# Patient Record
Sex: Male | Born: 1965 | Race: White | Hispanic: No | Marital: Married | Smoking: Never smoker
Health system: Southern US, Community
[De-identification: ages and names within clinical notes are randomized; demographics above are authoritative.]

## PROBLEM LIST (undated history)

## (undated) DIAGNOSIS — I10 Essential (primary) hypertension: Secondary | ICD-10-CM

## (undated) DIAGNOSIS — E039 Hypothyroidism, unspecified: Secondary | ICD-10-CM

## (undated) DIAGNOSIS — I2510 Atherosclerotic heart disease of native coronary artery without angina pectoris: Secondary | ICD-10-CM

## (undated) DIAGNOSIS — E785 Hyperlipidemia, unspecified: Secondary | ICD-10-CM

## (undated) HISTORY — PX: CORONARY ANGIOPLASTY: SHX604

## (undated) HISTORY — PX: HERNIA REPAIR: SHX51

## (undated) HISTORY — PX: NASAL SINUS SURGERY: SHX719

## (undated) HISTORY — PX: CHOLECYSTECTOMY: SHX55

## (undated) HISTORY — PX: CARDIAC SURGERY: SHX584

## (undated) HISTORY — PX: PILONIDAL CYST DRAINAGE: SHX743

## (undated) HISTORY — PX: TONSILLECTOMY: SUR1361

---

## 2004-10-08 DIAGNOSIS — I2510 Atherosclerotic heart disease of native coronary artery without angina pectoris: Secondary | ICD-10-CM

## 2004-10-08 HISTORY — DX: Atherosclerotic heart disease of native coronary artery without angina pectoris: I25.10

## 2014-11-05 DIAGNOSIS — E7800 Pure hypercholesterolemia, unspecified: Secondary | ICD-10-CM | POA: Insufficient documentation

## 2014-11-05 DIAGNOSIS — E039 Hypothyroidism, unspecified: Secondary | ICD-10-CM | POA: Insufficient documentation

## 2014-11-05 DIAGNOSIS — I10 Essential (primary) hypertension: Secondary | ICD-10-CM | POA: Insufficient documentation

## 2014-11-05 DIAGNOSIS — I2510 Atherosclerotic heart disease of native coronary artery without angina pectoris: Secondary | ICD-10-CM | POA: Insufficient documentation

## 2015-07-28 ENCOUNTER — Encounter: Payer: Self-pay | Admitting: *Deleted

## 2015-07-29 ENCOUNTER — Ambulatory Visit
Admission: RE | Admit: 2015-07-29 | Discharge: 2015-07-29 | Disposition: A | Discharge status: Home / Self Care | Payer: BLUE CROSS/BLUE SHIELD | Source: Ambulatory Visit | Attending: Gastroenterology | Admitting: Gastroenterology

## 2015-07-29 ENCOUNTER — Encounter: Payer: Self-pay | Admitting: *Deleted

## 2015-07-29 ENCOUNTER — Ambulatory Visit: Payer: BLUE CROSS/BLUE SHIELD | Admitting: Certified Registered Nurse Anesthetist

## 2015-07-29 ENCOUNTER — Encounter
Admission: RE | Disposition: A | Discharge status: Home / Self Care | Payer: Self-pay | Source: Ambulatory Visit | Attending: Gastroenterology

## 2015-07-29 DIAGNOSIS — I10 Essential (primary) hypertension: Secondary | ICD-10-CM | POA: Insufficient documentation

## 2015-07-29 DIAGNOSIS — Z7982 Long term (current) use of aspirin: Secondary | ICD-10-CM | POA: Diagnosis not present

## 2015-07-29 DIAGNOSIS — K644 Residual hemorrhoidal skin tags: Secondary | ICD-10-CM | POA: Insufficient documentation

## 2015-07-29 DIAGNOSIS — K621 Rectal polyp: Secondary | ICD-10-CM | POA: Diagnosis not present

## 2015-07-29 DIAGNOSIS — E039 Hypothyroidism, unspecified: Secondary | ICD-10-CM | POA: Diagnosis not present

## 2015-07-29 DIAGNOSIS — Z79899 Other long term (current) drug therapy: Secondary | ICD-10-CM | POA: Diagnosis not present

## 2015-07-29 DIAGNOSIS — E785 Hyperlipidemia, unspecified: Secondary | ICD-10-CM | POA: Insufficient documentation

## 2015-07-29 DIAGNOSIS — I2510 Atherosclerotic heart disease of native coronary artery without angina pectoris: Secondary | ICD-10-CM | POA: Diagnosis not present

## 2015-07-29 DIAGNOSIS — Z1211 Encounter for screening for malignant neoplasm of colon: Secondary | ICD-10-CM | POA: Diagnosis present

## 2015-07-29 HISTORY — DX: Atherosclerotic heart disease of native coronary artery without angina pectoris: I25.10

## 2015-07-29 HISTORY — PX: COLONOSCOPY WITH PROPOFOL: SHX5780

## 2015-07-29 HISTORY — DX: Essential (primary) hypertension: I10

## 2015-07-29 HISTORY — DX: Hypothyroidism, unspecified: E03.9

## 2015-07-29 HISTORY — DX: Hyperlipidemia, unspecified: E78.5

## 2015-07-29 SURGERY — COLONOSCOPY WITH PROPOFOL
Anesthesia: General

## 2015-07-29 MED ORDER — SODIUM CHLORIDE 0.9 % IV SOLN
INTRAVENOUS | Status: DC
  Administered 2015-07-29: 1000 mL via INTRAVENOUS

## 2015-07-29 MED ORDER — PROPOFOL 10 MG/ML IV BOLUS
INTRAVENOUS | Status: DC | PRN
  Administered 2015-07-29: 40 mg via INTRAVENOUS
  Administered 2015-07-29: 30 mg via INTRAVENOUS
  Administered 2015-07-29 (×2): 10 mg via INTRAVENOUS

## 2015-07-29 MED ORDER — MIDAZOLAM HCL 2 MG/2ML IJ SOLN
INTRAMUSCULAR | Status: DC | PRN
  Administered 2015-07-29: 2 mg via INTRAVENOUS

## 2015-07-29 MED ORDER — LIDOCAINE HCL (CARDIAC) 20 MG/ML IV SOLN
INTRAVENOUS | Status: DC | PRN
  Administered 2015-07-29: 100 mg via INTRAVENOUS

## 2015-07-29 MED ORDER — PROPOFOL 500 MG/50ML IV EMUL
INTRAVENOUS | Status: DC | PRN
  Administered 2015-07-29: 175 ug/kg/min via INTRAVENOUS

## 2015-07-29 NOTE — H&P (Signed)
  Primary Care Physician:  Rozanna BoxBABAOFF, MARC E, MD  Pre-Procedure History & Physical: HPI:  Chase Jensen is a 49 y.o. male is here for an colonoscopy.   Past Medical History  Diagnosis Date  . Hypothyroidism   . Hypertension   . Coronary artery disease   . Hyperlipemia     Past Surgical History  Procedure Laterality Date  . Hernia repair    . Cholecystectomy    . Nasal sinus surgery    . Pilonidal cyst drainage    . Cardiac surgery    . Coronary angioplasty    . Tonsillectomy      Prior to Admission medications   Medication Sig Start Date End Date Taking? Authorizing Provider  ALOE VERA PO Take by mouth.   Yes Historical Provider, MD  Ascorbic Acid (VITAMIN C) 1000 MG tablet Take 1,000 mg by mouth daily.   Yes Historical Provider, MD  aspirin (ASPIRIN EC) 81 MG EC tablet Take 81 mg by mouth daily. Swallow whole.   Yes Historical Provider, MD  levothyroxine (SYNTHROID, LEVOTHROID) 50 MCG tablet Take 50 mcg by mouth daily before breakfast.   Yes Historical Provider, MD  lisinopril (PRINIVIL,ZESTRIL) 10 MG tablet Take 10 mg by mouth daily.   Yes Historical Provider, MD  rosuvastatin (CRESTOR) 40 MG tablet Take 40 mg by mouth daily.   Yes Historical Provider, MD  vitamin B-12 (CYANOCOBALAMIN) 100 MCG tablet Take 100 mcg by mouth daily.   Yes Historical Provider, MD    Allergies as of 07/01/2015  . (Not on File)    History reviewed. No pertinent family history.  Social History   Social History  . Marital Status: Married    Spouse Name: N/A  . Number of Children: N/A  . Years of Education: N/A   Occupational History  . Not on file.   Social History Main Topics  . Smoking status: Never Smoker   . Smokeless tobacco: Not on file  . Alcohol Use: Not on file  . Drug Use: Not on file  . Sexual Activity: Not on file   Other Topics Concern  . Not on file   Social History Narrative     Physical Exam: BP 123/73 mmHg  Pulse 52  Temp(Src) 98 F (36.7 C) (Tympanic)   Resp 20  Ht 6' (1.829 m)  Wt 85.73 kg (189 lb)  BMI 25.63 kg/m2  SpO2 100% General:   Alert,  pleasant and cooperative in NAD Head:  Normocephalic and atraumatic. Neck:  Supple; no masses or thyromegaly. Lungs:  Clear throughout to auscultation.    Heart:  Regular rate and rhythm. Abdomen:  Soft, nontender and nondistended. Normal bowel sounds, without guarding, and without rebound.   Neurologic:  Alert and  oriented x4;  grossly normal neurologically.  Impression/Plan: Chase Jensen is here for an colonoscopy to be performed for screening  Risks, benefits, limitations, and alternatives regarding  colonoscopy have been reviewed with the patient.  Questions have been answered.  All parties agreeable.   Elnita MaxwellEIN, MATTHEW GORDON, MD  07/29/2015, 10:32 AM

## 2015-07-29 NOTE — Op Note (Signed)
[facility] Gastroenterology Patient Name: Chase Jensen Procedure Date: 07/29/2015 10:41 AM MRN: [id number] Account #: [id number] Date of Birth: [birth year] Admit Type: Outpatient Age: 49 Room: [facility] ENDO ROOM 3 Gender: Male Note Status: Finalized Procedure:         Colonoscopy Indications:       Screening for colorectal malignant neoplasm, Last                     colonoscopy 10 years ago Patient Profile:   This is a 49 year old male. Providers:         Rhona Raider. Shelle Iron, MD Referring MD:      Hassell Halim (Referring MD) Medicines:         Propofol per Anesthesia Complications:     No immediate complications. Procedure:         Pre-Anesthesia Assessment:                    - Prior to the procedure, a History and Physical was                     performed, and patient medications, allergies and                     sensitivities were reviewed. The patient's tolerance of                     previous anesthesia was reviewed.                    After obtaining informed consent, the colonoscope was                     passed under direct vision. Throughout the procedure, the                     patient's blood pressure, pulse, and oxygen saturations                     were monitored continuously. The Olympus CF-Q160AL                     colonoscope (S#. [id number]) was introduced through the anus                     and advanced to the the cecum, identified by appendiceal                     orifice and ileocecal valve. The colonoscopy was performed                     without difficulty. The patient tolerated the procedure                     well. The quality of the bowel preparation was good. Findings:      The perianal exam findings include non-thrombosed external hemorrhoids.      A 4 mm polyp was found in the rectum. The polyp was sessile. The polyp       was removed with a cold snare. Resection and retrieval were complete.      The exam was otherwise without  abnormality on direct and retroflexion       views. Impression:        - Non-thrombosed external hemorrhoids found on perianal  exam.                    - One 4 mm polyp in the rectum. Resected and retrieved.                    - The examination was otherwise normal on direct and                     retroflexion views. Recommendation:    - Observe patient in GI recovery unit.                    - High fiber diet.                    - Continue present medications.                    - Await pathology results.                    - Repeat colonoscopy for surveillance based on pathology                     results.                    - Return to referring physician.                    - The findings and recommendations were discussed with the                     patient.                    - The findings and recommendations were discussed with the                     patient's family. Procedure Code(s): --- Professional ---                    [phone], Colonoscopy, flexible; with removal of tumor(s),                     polyp(s), or other lesion(s) by snare technique CPT copyright 2014 American Medical Association. All rights reserved. The codes documented in this report are preliminary and upon coder review may  be revised to meet current compliance requirements. Kathalene FramesMatthew G Rein, MD 07/29/2015 11:15:04 AM This report has been signed electronically. Number of Addenda: 0 Note Initiated On: 07/29/2015 10:41 AM Scope Withdrawal Time: 0 hours 14 minutes 15 seconds  Total Procedure Duration: 0 hours 21 minutes 33 seconds       [facility]

## 2015-07-29 NOTE — Transfer of Care (Signed)
Immediate Anesthesia Transfer of Care Note  Patient: Chase Jensen  Procedure(s) Performed: Procedure(s): COLONOSCOPY WITH PROPOFOL (N/A)  Patient Location: PACU  Anesthesia Type:General  Level of Consciousness: sedated  Airway & Oxygen Therapy: Patient Spontanous Breathing and Patient connected to nasal cannula oxygen  Post-op Assessment: Report given to RN and Post -op Vital signs reviewed and stable  Post vital signs: Reviewed and stable  Last Vitals:  Filed Vitals:   07/29/15 1112  BP: 95/61  Pulse:   Temp: 35.9 C  Resp: 16    Complications: No apparent anesthesia complications

## 2015-07-29 NOTE — Anesthesia Preprocedure Evaluation (Signed)
Anesthesia Evaluation  Patient identified by MRN, date of birth, ID band Patient awake    Reviewed: Allergy & Precautions, H&P , NPO status , Patient's Chart, lab work & pertinent test results, reviewed documented beta blocker date and time   History of Anesthesia Complications Negative for: history of anesthetic complications  Airway Mallampati: I  TM Distance: >3 FB Neck ROM: full    Dental no notable dental hx. (+) Caps   Pulmonary neg pulmonary ROS,    Pulmonary exam normal breath sounds clear to auscultation       Cardiovascular Exercise Tolerance: Good hypertension, On Medications (-) angina+ CAD  (-) Past MI, (-) Cardiac Stents and (-) CABG Normal cardiovascular exam(-) dysrhythmias (-) Valvular Problems/Murmurs Rhythm:regular Rate:Normal     Neuro/Psych negative neurological ROS  negative psych ROS   GI/Hepatic negative GI ROS, Neg liver ROS,   Endo/Other  neg diabetesHypothyroidism   Renal/GU negative Renal ROS  negative genitourinary   Musculoskeletal   Abdominal   Peds  Hematology negative hematology ROS (+)   Anesthesia Other Findings Past Medical History:   Hypothyroidism                                               Hypertension                                                 Coronary artery disease                                      Hyperlipemia                                                 Reproductive/Obstetrics negative OB ROS                             Anesthesia Physical Anesthesia Plan  ASA: II  Anesthesia Plan: General   Post-op Pain Management:    Induction:   Airway Management Planned:   Additional Equipment:   Intra-op Plan:   Post-operative Plan:   Informed Consent: I have reviewed the patients History and Physical, chart, labs and discussed the procedure including the risks, benefits and alternatives for the proposed anesthesia with the  patient or authorized representative who has indicated his/her understanding and acceptance.   Dental Advisory Given  Plan Discussed with: Anesthesiologist, CRNA and Surgeon  Anesthesia Plan Comments:         Anesthesia Quick Evaluation

## 2015-07-29 NOTE — Anesthesia Procedure Notes (Signed)
Performed by: Jaques Mineer Pre-anesthesia Checklist: Patient identified, Emergency Drugs available, Suction available and Patient being monitored Patient Re-evaluated:Patient Re-evaluated prior to inductionOxygen Delivery Method: Nasal cannula     

## 2015-08-01 LAB — SURGICAL PATHOLOGY

## 2015-08-01 NOTE — Anesthesia Postprocedure Evaluation (Signed)
  Anesthesia Post-op Note  Patient: Chase Jensen  Procedure(s) Performed: Procedure(s): COLONOSCOPY WITH PROPOFOL (N/A)  Anesthesia type:General  Patient location: PACU  Post pain: Pain level controlled  Post assessment: Post-op Vital signs reviewed, Patient's Cardiovascular Status Stable, Respiratory Function Stable, Patent Airway and No signs of Nausea or vomiting  Post vital signs: Reviewed and stable  Last Vitals:  Filed Vitals:   07/29/15 1140  BP: 119/77  Pulse: 54  Temp:   Resp: 14    Level of consciousness: awake, alert  and patient cooperative  Complications: No apparent anesthesia complications

## 2015-08-04 ENCOUNTER — Encounter: Payer: Self-pay | Admitting: Gastroenterology

## 2016-07-07 NOTE — Progress Notes (Signed)
Chase Jensen [address] [place], [place] [address] Phone: [phone] Subjective:    I'm seeing this patient by the request  of:  Baboff MD.  Carren Rang  CC: Bilateral hamstring pain.  Chase Jensen  Chase Jensen is a 50 y.o. male coming in with complaint of hamstring pain. Patient hasn't been running. Has been having this pain now for several months. Patient states that this left greater than right but both seemed to be hurting him. Patient was seen by primary care provider and was given stretches and declined formal physical therapy patient states that the pain in extension with gluteal region down to the right knee. Patient has tried over-the-counter anti-inflammatories that does help but does not ever completely resolved the pain. Only does aerobic exercises with no weight lifting. Patient rates the severity of pain a 6 out of 10. States that the pain seems to be worse when he goes from sitting to standing position. States that he feels some tightness most immediately when he tries to do any running as well.     Past Medical History:  Diagnosis Date  . Coronary artery disease   . Hyperlipemia   . Hypertension   . Hypothyroidism    Past Surgical History:  Procedure Laterality Date  . CARDIAC SURGERY    . CHOLECYSTECTOMY    . COLONOSCOPY WITH PROPOFOL N/A 07/29/2015   Procedure: COLONOSCOPY WITH PROPOFOL;  Surgeon: Elnita Maxwell, MD;  Location[facility] ENDOSCOPY;  Service: Endoscopy;  Laterality: N/A;  . CORONARY ANGIOPLASTY    . HERNIA REPAIR    . NASAL SINUS SURGERY    . PILONIDAL CYST DRAINAGE    . TONSILLECTOMY     Social History   Social History  . Marital status: Married    Spouse name: N/A  . Number of children: N/A  . Years of education: N/A   Social History Main Topics  . Smoking status: Never Smoker  . Smokeless tobacco: Not on file  . Alcohol use Not on file  . Drug use: Unknown  . Sexual activity: Not on file    Other Topics Concern  . Not on file   Social History Narrative  . No narrative on file   Allergies  Allergen Reactions  . Penicillins Rash  . Propoxyphene Rash    Darvocet N Acetaminophen   No family history on file.  Past medical history, social, surgical and family history all reviewed in electronic medical record.  No pertanent information unless stated regarding to the chief complaint.   Review of Systems: No headache, visual changes, nausea, vomiting, diarrhea, constipation, dizziness, abdominal pain, skin rash, fevers, chills, night sweats, weight loss, swollen lymph nodes, body aches, joint swelling, muscle aches, chest pain, shortness of breath, mood changes.   Objective  Blood pressure 122/76, pulse 62, weight 212 lb (96.2 kg).  General: No apparent distress alert and oriented x3 mood and affect normal, dressed appropriately.  HEENT: Pupils equal, extraocular movements intact  Respiratory: Patient's speak in full sentences and does not appear short of breath  Cardiovascular: No lower extremity edema, non tender, no erythema  Skin: Warm dry intact with no signs of infection or rash on extremities or on axial skeleton.  Abdomen: Soft nontender  Neuro: Cranial nerves II through XII are intact, neurovascularly intact in all extremities with 2+ DTRs and 2+ pulses.  Lymph: No lymphadenopathy of posterior or anterior cervical chain or axillae bilaterally.  Gait normal with good balance and coordination.  MSK:  Non tender with full range of motion and good stability and symmetric strength and tone of shoulders, elbows, wrist, hip, knee and ankles bilaterally.  Back Exam:  Inspection: Unremarkable  Motion: Flexion 45 deg, Extension 25 deg, Side Bending to 35 deg bilaterally,  Rotation to 35 deg bilaterally  SLR laying: Negative  XSLR laying: Negative  Palpable tenderness: Tenderness more over the piriformis muscle. FABER: Positive right side. Sensory change: Gross sensation  intact to all lumbar and sacral dermatomes.  Reflexes: 2+ at both patellar tendons, 2+ at achilles tendons, Babinski's downgoing.  Strength at foot  Plantar-flexion: 5/5 Dorsi-flexion: 5/5 Eversion: 5/5 Inversion: 5/5  Leg strength  Quad: 5/5 Hamstring: 5/5 Hip flexor: 5/5 Hip abductors: 4/5 but symmetric Gait unremarkable.  Procedure note 97110; 15 minutes spent for Therapeutic exercises as stated in above notes.  This included exercises focusing on stretching, strengthening, with significant focus on eccentric aspects. Piriformis Syndrome  Using an anatomical model, reviewed with the patient the structures involved and how they related to diagnosis. The patient indicated understanding.   The patient was given a handout from Dr. Ailene Ardsouzier's book "The Sports Jensen Patient Advisor" describing the anatomy and rehabilitation of the following condition: Piriformis Syndrome  Also given a handout with more extensive Piriformis stretching, hip flexor and abductor strengthening, ham stretching  Rec deep massage, explained self-massage with ball    Proper technique shown and discussed handout in great detail with ATC.  All questions were discussed and answered.     Impression and Recommendations:     This case required medical decision making of moderate complexity.      Note: This dictation was prepared with Dragon dictation along with smaller phrase technology. Any transcriptional errors that result from this process are unintentional.

## 2016-07-09 ENCOUNTER — Ambulatory Visit (INDEPENDENT_AMBULATORY_CARE_PROVIDER_SITE_OTHER): Payer: BLUE CROSS/BLUE SHIELD | Admitting: Family Medicine

## 2016-07-09 ENCOUNTER — Encounter: Payer: Self-pay | Admitting: Family Medicine

## 2016-07-09 DIAGNOSIS — G5701 Lesion of sciatic nerve, right lower limb: Secondary | ICD-10-CM | POA: Insufficient documentation

## 2016-07-09 MED ORDER — VITAMIN D (ERGOCALCIFEROL) 1.25 MG (50000 UNIT) PO CAPS: 50000.0000 [IU] | ORAL_CAPSULE | ORAL | 0 refills | Status: DC

## 2016-07-09 NOTE — Patient Instructions (Signed)
Good to see you  Ice 20 minutes 2 times daily. Usually after activity and before bed. Exercises 3 times a week.  Duexis 3 times a day for 3 days.  Once weekly vitamin D for the next 12 weeks. This will help with muscle strength and endurance I can tell not running is causing some anxiety.  I would prefer biking but if you need to run limit to 3 times weekly at most.  Start a walk-run progression  30 mins: - Run 2 mins, then walk 1 min. -Then run 3 mins, and walk 1 min. -Then run 4 mins, and walk 1 min. -Then run 5 mins, and walk 1 min. -Slowly build up weekly to running 30 mins nonstop.  If painful at any of the steps, back up one step.  Thigh compression sleeve can help with activity as well.  See me again in 3-4 weeks.

## 2016-07-09 NOTE — Assessment & Plan Note (Addendum)
Piriformis Syndrome  Using an anatomical model, reviewed with the patient the structures involved and how they related to diagnosis. The patient indicated understanding.   The patient was given a handout from Dr. Ailene Ardsouzier's book "The Sports Medicine Patient Advisor" describing the anatomy and rehabilitation of the following condition: Piriformis Syndrome  Also given a handout with more extensive Piriformis stretching, hip flexor and abductor strengthening, ham stretching  Rec deep massage, explained self-massage with ball Discussed also once weekly vitamin D can help with muscle strength and endurance. We discussed core strengthening exercises. Work with Event organiserathletic trainer. Patient will come back in 4-6 weeks for further evaluation and treatment.

## 2016-07-10 ENCOUNTER — Encounter: Payer: Self-pay | Admitting: Family Medicine

## 2016-07-12 ENCOUNTER — Encounter: Payer: Self-pay | Admitting: Family Medicine

## 2016-07-12 MED ORDER — VITAMIN D (ERGOCALCIFEROL) 1.25 MG (50000 UNIT) PO CAPS: 50000.0000 [IU] | ORAL_CAPSULE | ORAL | 0 refills | Status: DC

## 2016-07-29 NOTE — Progress Notes (Signed)
[name] [facility] [address] [place], [place] [address] Phone: [phone] Subjective:      CC: Bilateral hamstring pain Follow-up  GNF:[id number]:Subjective  Chase Jensen is a [age] y.o. male coming in with complaint of hamstring pain. Patient was found to have more of a piriformis syndrome on the right side. Given once weekly vitamin D, home exercises, icing protocol. Patient states She is a proximally 70% better. States that he is not having as much pain. Still some radiation going down the leg. Seems to stop at the knee. Has not been running. Unable to bike secondary to hip pain.  New problem. Patient is also having left elbow pain. Worse with repetitive activity. Describes dull, throbbing aching sensation. Has woken him up at night. Denies any significant weakness but states it does not feel normal. Patient has to change certain daily activities secondary to the discomfort.    Past Medical History:  Diagnosis Date  . Coronary artery disease   . Hyperlipemia   . Hypertension   . Hypothyroidism    Past Surgical History:  Procedure Laterality Date  . CARDIAC SURGERY    . CHOLECYSTECTOMY    . COLONOSCOPY WITH PROPOFOL N/A 07/29/2015   Procedure: COLONOSCOPY WITH PROPOFOL;  Surgeon: Elnita MaxwellMatthew Gordon Rein, MD;  [facility];  Service: Endoscopy;  Laterality: N/A;  . CORONARY ANGIOPLASTY    . HERNIA REPAIR    . NASAL SINUS SURGERY    . PILONIDAL CYST DRAINAGE    . TONSILLECTOMY     Social History   Social History  . Marital status: Married    Spouse name: N/A  . Number of children: N/A  . Years of education: N/A   Social History Main Topics  . Smoking status: Never Smoker  . Smokeless tobacco: Not on file  . Alcohol use Not on file  . Drug use: Unknown  . Sexual activity: Not on file   Other Topics Concern  . Not on file   Social History Narrative  . No narrative on file   Allergies  Allergen Reactions  . Penicillins Rash  .  Propoxyphene Rash    Darvocet N Acetaminophen   No family history on file.  Past medical history, social, surgical and family history all reviewed in electronic medical record.  No pertanent information unless stated regarding to the chief complaint.   Review of Systems: No headache, visual changes, nausea, vomiting, diarrhea, constipation, dizziness, abdominal pain, skin rash, fevers, chills, night sweats, weight loss, swollen lymph nodes, body aches, joint swelling, muscle aches, chest pain, shortness of breath, mood changes.   Objective  There were no vitals taken for this visit.  General: No apparent distress alert and oriented x3 mood and affect normal, dressed appropriately.  HEENT: Pupils equal, extraocular movements intact  Respiratory: Patient's speak in full sentences and does not appear short of breath  Cardiovascular: No lower extremity edema, non tender, no erythema  Skin: Warm dry intact with no signs of infection or rash on extremities or on axial skeleton.  Abdomen: Soft nontender  Neuro: Cranial nerves II through XII are intact, neurovascularly intact in all extremities with 2+ DTRs and 2+ pulses.  Lymph: No lymphadenopathy of posterior or anterior cervical chain or axillae bilaterally.  Gait normal with good balance and coordination.  MSK:  Non tender with full range of motion and good stability and symmetric strength and tone of shoulders,  wrist, hip, knee and ankles bilaterally.  Back Exam:  Inspection:  Unremarkable  Motion: Flexion 45 deg, Extension 25 deg, Side Bending to 35 deg bilaterally,  Rotation to 35 deg bilaterally  SLR laying: Negative  XSLR laying: Negative  Palpable tenderness: Tenderness more over the piriformis muscle. Mild in paraspnal musculature.  FABER: Positive right side still  Sensory change: Gross sensation intact to all lumbar and sacral dermatomes.  Reflexes: 2+ at both patellar tendons, 2+ at achilles tendons, Babinski's downgoing.    Strength at foot  Plantar-flexion: 5/5 Dorsi-flexion: 5/5 Eversion: 5/5 Inversion: 5/5  Leg strength  Quad: 5/5 Hamstring: 5/5 Hip flexor: 5/5 Hip abductors: 4+/5 but symmetric Gait unremarkable.  Elbow: Left Unremarkable to inspection. Range of motion full pronation, supination, flexion, extension. Strength is full to all of the above directions Stable to varus, valgus stress. Negative moving valgus stress test. Tender to palpation lateral epicondylar region.  Ulnar nerve does not sublux. Negative cubital tunnel Tinel's. Contralateral elbow unremarkable  Osteopathic findings.  C2 flexed rotated and side bent right C4 flexed rotated and side bent left T3 extended rotated and side bent right with inhaled third rib T8 extended rotated and side bent left.  L2 flexed rotated and side bent right Sacrum left on left   Impression and Recommendations:     This case required medical decision making of moderate complexity.      Note: This dictation was prepared with Dragon dictation along with smaller phrase technology. Any transcriptional errors that result from this process are unintentional.

## 2016-07-30 ENCOUNTER — Ambulatory Visit (INDEPENDENT_AMBULATORY_CARE_PROVIDER_SITE_OTHER): Payer: BLUE CROSS/BLUE SHIELD | Admitting: Family Medicine

## 2016-07-30 ENCOUNTER — Encounter: Payer: Self-pay | Admitting: Family Medicine

## 2016-07-30 DIAGNOSIS — M7712 Lateral epicondylitis, left elbow: Secondary | ICD-10-CM | POA: Diagnosis not present

## 2016-07-30 DIAGNOSIS — M999 Biomechanical lesion, unspecified: Secondary | ICD-10-CM | POA: Insufficient documentation

## 2016-07-30 DIAGNOSIS — G5701 Lesion of sciatic nerve, right lower limb: Secondary | ICD-10-CM | POA: Diagnosis not present

## 2016-07-30 MED ORDER — GABAPENTIN 100 MG PO CAPS: 200.0000 mg | ORAL_CAPSULE | Freq: Every day | ORAL | 3 refills | Status: DC

## 2016-07-30 MED ORDER — DICLOFENAC SODIUM 2 % TD SOLN: 2.0000 "application " | Freq: Two times a day (BID) | TRANSDERMAL | 3 refills | Status: DC

## 2016-07-30 NOTE — Assessment & Plan Note (Signed)
Decision today to treat with OMT was based on Physical Exam  After verbal consent patient was treated with HVLA, ME, FPR techniques in cervical, thoracic, lumbar and sacral areas  Patient tolerated the procedure well with improvement in symptoms  Patient given exercises, stretches and lifestyle modifications  See medications in patient instructions if given  Patient will follow up in 3-6 weeks 

## 2016-07-30 NOTE — Patient Instructions (Signed)
You are making progress We tried some manipulation today and I think you will do great  Keep trucking along Gabapentin 200mg  at night For the arm pick up things underhand or thumbs up Avoid excessive wrist extension  pennsaid pinkie amount topically 2 times daily as needed.  See me again in 3-4 weeks.

## 2016-07-30 NOTE — Assessment & Plan Note (Signed)
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  Pt. given a formal rehab program. Series of concentric and eccentric exercises should be done starting with no weight, work up to 1 lb, hammer, etc.  Use counterforce strap if working or using hands.  Formal PT would be beneficial. Emphasized stretching an cross-friction massage Emphasized proper palms up lifting biomechanics to unload ECRB  

## 2016-07-30 NOTE — Assessment & Plan Note (Signed)
Patient is making some improvement. Attempted osteopathic manipulation. Tolerated the procedure well. We discussed icing regimen and core strengthening. Patient will continue with conservative therapy. Started on gabapentin for nighttime pain and radiation. Follow-up again

## 2016-08-20 ENCOUNTER — Ambulatory Visit (INDEPENDENT_AMBULATORY_CARE_PROVIDER_SITE_OTHER): Payer: BLUE CROSS/BLUE SHIELD | Admitting: Family Medicine

## 2016-08-20 ENCOUNTER — Encounter: Payer: Self-pay | Admitting: Family Medicine

## 2016-08-20 VITALS — BP 130/80 | HR 65 | Ht 72.0 in | Wt 215.0 lb

## 2016-08-20 DIAGNOSIS — G5701 Lesion of sciatic nerve, right lower limb: Secondary | ICD-10-CM

## 2016-08-20 DIAGNOSIS — M999 Biomechanical lesion, unspecified: Secondary | ICD-10-CM | POA: Diagnosis not present

## 2016-08-20 DIAGNOSIS — M7712 Lateral epicondylitis, left elbow: Secondary | ICD-10-CM

## 2016-08-20 NOTE — Progress Notes (Signed)
Chase Jensen D.O. [facility] [address] [place], [place] [address] Phone: [phone] Subjective:      CC: Bilateral hamstring pain Follow-up  OZH:[id number]:Subjective  Chase Jensen is a 50 y.o. male coming in with complaint of hamstring pain. Patient was found to have more of a piriformis syndrome on the right side. Given once weekly vitamin D, home exercises, icing protocol. Patient states Overall seems to be doing relatively well. Still doing much better overall. States that a 5% better from the initial visit. Still having some mild discomfort. Has not started running yet. We'll like to return to running but is scared that this could aggravate the area.  She was given left elbow pain. Was found to have a lateral epicondylitis. Patient was a do home exercises, icing and topical anti-inflammatories. Patient has not been doing on areolar basis secondary to discomfort. Has had other things happen as well and is not having as much time. States though that if he does do the exercises it does feel 30% better on average.    Past Medical History:  Diagnosis Date  . Coronary artery disease   . Hyperlipemia   . Hypertension   . Hypothyroidism    Past Surgical History:  Procedure Laterality Date  . CARDIAC SURGERY    . CHOLECYSTECTOMY    . COLONOSCOPY WITH PROPOFOL N/A 07/29/2015   Procedure: COLONOSCOPY WITH PROPOFOL;  Surgeon: Elnita MaxwellMatthew Gordon Rein, MD;  [facility];  Service: Endoscopy;  Laterality: N/A;  . CORONARY ANGIOPLASTY    . HERNIA REPAIR    . NASAL SINUS SURGERY    . PILONIDAL CYST DRAINAGE    . TONSILLECTOMY     Social History   Social History  . Marital status: Married    Spouse name: N/A  . Number of children: N/A  . Years of education: N/A   Social History Main Topics  . Smoking status: Never Smoker  . Smokeless tobacco: None  . Alcohol use None  . Drug use: Unknown  . Sexual activity: Not Asked   Other Topics Concern  . None   Social  History Narrative  . None   Allergies  Allergen Reactions  . Penicillins Rash  . Propoxyphene Rash    Darvocet N Acetaminophen   No family history on file. Is any history of rheumatological diseases.  Past medical history, social, surgical and family history all reviewed in electronic medical record.  No pertanent information unless stated regarding to the chief complaint.   Review of Systems: No headache, visual changes, nausea, vomiting, diarrhea, constipation, dizziness, abdominal pain, skin rash, fevers, chills, night sweats, weight loss, swollen lymph nodes, body aches, joint swelling, muscle aches, chest pain, shortness of breath, mood changes.   Objective  Blood pressure 130/80, pulse 65, height 6' (1.829 m), weight 215 lb (97.5 kg), SpO2 96 %.  General: No apparent distress alert and oriented x3 mood and affect normal, dressed appropriately.  HEENT: Pupils equal, extraocular movements intact  Respiratory: Patient's speak in full sentences and does not appear short of breath  Cardiovascular: No lower extremity edema, non tender, no erythema  Skin: Warm dry intact with no signs of infection or rash on extremities or on axial skeleton.  Abdomen: Soft nontender  Neuro: Cranial nerves II through XII are intact, neurovascularly intact in all extremities with 2+ DTRs and 2+ pulses.  Lymph: No lymphadenopathy of posterior or anterior cervical chain or axillae bilaterally.  Gait normal with good balance and coordination.  MSK:  Non tender with full range of motion and good stability and symmetric strength and tone of shoulders,  wrist, hip, knee and ankles bilaterally.  Neck: Inspection unremarkable. No palpable stepoffs. Negative Spurling's maneuver. Mild tightness of the Grip strength and sensation normal in bilateral hands Strength good C4 to T1 distribution No sensory change to C4 to T1 Negative Hoffman sign bilaterally Reflexes normal Back Exam:  Inspection: Unremarkable    Motion: Flexion 45 deg, Extension 25 deg, Side Bending to 40 deg bilaterally,  Rotation to 40 deg bilaterally  SLR laying: Negative  XSLR laying: Negative  Palpable tenderness: Continue mild discomfort over the paraspinal musculature of the lumbar spine as well as the piriformis on the right side FABER: Still mild tightness of the right side Sensory change: Gross sensation intact to all lumbar and sacral dermatomes.  Reflexes: 2+ at both patellar tendons, 2+ at achilles tendons, Babinski's downgoing.  Strength at foot  Plantar-flexion: 5/5 Dorsi-flexion: 5/5 Eversion: 5/5 Inversion: 5/5  Leg strength  Quad: 5/5 Hamstring: 5/5 Hip flexor: 5/5 Hip abductors: 4+/5 but symmetric Gait unremarkable.  Elbow: Left Unremarkable to inspection. Range of motion full pronation, supination, flexion, extension. Strength is full to all of the above directions Stable to varus, valgus stress. Negative moving valgus stress test. Mild tenderness still remaining of the lateral epicondylar region Ulnar nerve does not sublux. Negative cubital tunnel Tinel's. Contralateral elbow unremarkable  Osteopathic findings.  C2 flexed rotated and side bent right C6 flexed rotated and side bent left T3 extended rotated and side bent right with inhaled third rib T7 extended rotated and side bent left.  L2 flexed rotated and side bent right Sacrum left on left   Impression and Recommendations:     This case required medical decision making of moderate complexity.      Note: This dictation was prepared with Dragon dictation along with smaller phrase technology. Any transcriptional errors that result from this process are unintentional.

## 2016-08-20 NOTE — Assessment & Plan Note (Signed)
Decision today to treat with OMT was based on Physical Exam  After verbal consent patient was treated with HVLA, ME, FPR techniques in cervical, thoracic, lumbar and sacral areas  Patient tolerated the procedure well with improvement in symptoms  Patient given exercises, stretches and lifestyle modifications  See medications in patient instructions if given  Patient will follow up in 4-6 weeks 

## 2016-08-20 NOTE — Assessment & Plan Note (Signed)
Overall patient seems to be doing well. Low likelihood of lumbar radiculopathy. Patient was given a running progression today. Continue the gabapentin if he is taking them regularly. Does respond well to osteopathic manipulation. Follow-up again in 4-6 weeks

## 2016-08-20 NOTE — Assessment & Plan Note (Signed)
Patient was given a wrist brace. Encourage patient to do the exercises on a more regular basis. Does respond fairly well to the anti-inflammatory. Worsening symptoms patient could use symmetric ulcer patches and we'll discuss at follow-up as well as the potential for formal physical therapy. We'll see patient back in 4-6 weeks

## 2016-08-20 NOTE — Patient Instructions (Signed)
Good to se eyou  Ice is your friend  Wear wrist brace day and night for next week then nightly for 2 weeks.  Avoid any overhand lifting.  Keep working on the core and exercises for the back  Start a walk-run progression:  Once you have reached 30 mins: - Run 2 mins, then walk 1 min for 1st week -Then run 3 mins, and walk 1 min 2nd week.  -Then run 4 mins, and walk 1 min. -Then run 5 mins, and walk 1 min. -Slowly build up weekly to running 30 mins nonstop.  If painful at any of the steps, back up one step. I think manipulation helps See me again in 4-6 weeks.

## 2016-09-25 NOTE — Progress Notes (Signed)
[name] [facility] [address] [place], [place] [address] Phone: [phone] Subjective:      CC: Bilateral hamstring pain Follow-up  QMV:[id number]:Subjective  Dwyane DeeBrian C Alderete is a 50 y.o. male coming in with complaint of hamstring pain. Patient was found to have more of a piriformis syndrome on the right side. Has been making progress overall. Very minimal discomfort. Has responded fairly well to osteopathic manipulation. No radicular symptoms at this time  She was given left elbow pain. Found to have more of a lateral epicondylitis. No significant improvement. States that the pain is severe. Didn't do the bracing. Patient has been doing the topical anti-inflammatories..    Past Medical History:  Diagnosis Date  . Coronary artery disease   . Hyperlipemia   . Hypertension   . Hypothyroidism    Past Surgical History:  Procedure Laterality Date  . CARDIAC SURGERY    . CHOLECYSTECTOMY    . COLONOSCOPY WITH PROPOFOL N/A 07/29/2015   Procedure: COLONOSCOPY WITH PROPOFOL;  Surgeon: Elnita MaxwellMatthew Gordon Rein, MD;  [facility];  Service: Endoscopy;  Laterality: N/A;  . CORONARY ANGIOPLASTY    . HERNIA REPAIR    . NASAL SINUS SURGERY    . PILONIDAL CYST DRAINAGE    . TONSILLECTOMY     Social History   Social History  . Marital status: Married    Spouse name: N/A  . Number of children: N/A  . Years of education: N/A   Social History Main Topics  . Smoking status: Never Smoker  . Smokeless tobacco: Not on file  . Alcohol use Not on file  . Drug use: Unknown  . Sexual activity: Not on file   Other Topics Concern  . Not on file   Social History Narrative  . No narrative on file   Allergies  Allergen Reactions  . Penicillins Rash  . Propoxyphene Rash    Darvocet N Acetaminophen   No family history on file. Is any history of rheumatological diseases.  Past medical history, social, surgical and family history all reviewed in electronic medical  record.  No pertanent information unless stated regarding to the chief complaint.  Review of Systems: No headache, visual changes, nausea, vomiting, diarrhea, constipation, dizziness, abdominal pain, skin rash, fevers, chills, night sweats, weight loss, swollen lymph nodes, body aches, joint swelling, muscle aches, chest pain, shortness of breath, mood changes.     Objective  There were no vitals taken for this visit.  Systems examined below as of 09/26/16 General: NAD A&O x3 mood, affect normal  HEENT: Pupils equal, extraocular movements intact no nystagmus Respiratory: not short of breath at rest or with speaking Cardiovascular: No lower extremity edema, non tender Skin: Warm dry intact with no signs of infection or rash on extremities or on axial skeleton. Abdomen: Soft nontender, no masses Neuro: Cranial nerves  intact, neurovascularly intact in all extremities with 2+ DTRs and 2+ pulses. Lymph: No lymphadenopathy appreciated today  Gait normal with good balance and coordination.  MSK: Non tender with full range of motion and good stability and symmetric strength and tone of shoulders,  wrist,  knee hips and ankles bilaterally.   Neck: Inspection unremarkable. No palpable stepoffs. Negative Spurling's maneuver. Continued lacking the last 5 of side bending bilaterally Grip strength and sensation normal in bilateral hands Strength good C4 to T1 distribution No sensory change to C4 to T1 Negative Hoffman sign bilaterally Reflexes normal Back Exam:  Inspection: Unremarkable  Motion: Flexion 45 deg,  Extension 25 deg, Side Bending to 40 deg bilaterally,  Rotation to 40 deg bilaterally stable with range of motion SLR laying: Negative  XSLR laying: Negative  Palpable tenderness: More tenderness over the piriformis muscle but less over the paraspinal musculature of the lumbar spine FABER: Still mild tightness of the right side stable from previous exam Sensory change: Gross sensation  intact to all lumbar and sacral dermatomes.  Reflexes: 2+ at both patellar tendons, 2+ at achilles tendons, Babinski's downgoing.  Strength at foot  Plantar-flexion: 5/5 Dorsi-flexion: 5/5 Eversion: 5/5 Inversion: 5/5  Leg strength  Quad: 5/5 Hamstring: 5/5 Hip flexor: 5/5 Hip abductors: 4+/5 but symmetric Gait unremarkable.  Elbow: Left Unremarkable to inspection. Range of motion full pronation, supination, flexion, extension. Strength is full to all of the above directions Stable to varus, valgus stress. Negative moving valgus stress test. Moderate tenderness over the lateral epicondylar region to palpation and resisted wrist extension Ulnar nerve does not sublux. Negative cubital tunnel Tinel's. Contralateral elbow unremarkable  Osteopathic findings.  C2 flexed rotated and side bent right C6 flexed rotated and side bent left T3 extended rotated and side bent right with inhaled third rib T7 extended rotated and side bent left.  L2 flexed rotated and side bent right Sacrum left on left   Impression and Recommendations:     This case required medical decision making of moderate complexity.      Note: This dictation was prepared with Dragon dictation along with smaller phrase technology. Any transcriptional errors that result from this process are unintentional.

## 2016-09-26 ENCOUNTER — Encounter: Payer: Self-pay | Admitting: Family Medicine

## 2016-09-26 ENCOUNTER — Ambulatory Visit (INDEPENDENT_AMBULATORY_CARE_PROVIDER_SITE_OTHER): Payer: Managed Care, Other (non HMO) | Admitting: Family Medicine

## 2016-09-26 VITALS — BP 138/82 | HR 67 | Ht 72.0 in | Wt 220.0 lb

## 2016-09-26 DIAGNOSIS — G5701 Lesion of sciatic nerve, right lower limb: Secondary | ICD-10-CM

## 2016-09-26 DIAGNOSIS — M999 Biomechanical lesion, unspecified: Secondary | ICD-10-CM | POA: Diagnosis not present

## 2016-09-26 DIAGNOSIS — M7712 Lateral epicondylitis, left elbow: Secondary | ICD-10-CM

## 2016-09-26 NOTE — Assessment & Plan Note (Signed)
No significant improvement. Sent to formal physical therapy. Trying topical anti-inflammatories on a more regular basis. Worsening symptoms possible injection.

## 2016-09-26 NOTE — Assessment & Plan Note (Signed)
Decision today to treat with OMT was based on Physical Exam  After verbal consent patient was treated with HVLA, ME, FPR techniques in cervical, thoracic, lumbar and sacral areas  Patient tolerated the procedure well with improvement in symptoms  Patient given exercises, stretches and lifestyle modifications  See medications in patient instructions if given  Patient will follow up in 4-6 weeks 

## 2016-09-26 NOTE — Patient Instructions (Addendum)
Good to see you  Happy holidays!  Happy New Year! Stay active Hip flexor stretches are key PT will be calling you ion the elbow.  Read about PRP injection in case we need it.  I think your back likes manipulation  See me again in 6 weeks!

## 2016-09-26 NOTE — Assessment & Plan Note (Signed)
Patient is doing well. Seems to be stable. We discussed the possibility of injection which patient declined. Discussed formal physical therapy but patient thinks he has done relatively well at this time. Patient will continue on the gabapentin and the home exercises. Encourage core strengthening. Follow-up again in 4-6 weeks

## 2016-10-01 ENCOUNTER — Other Ambulatory Visit: Payer: Self-pay | Admitting: Family Medicine

## 2016-10-02 NOTE — Telephone Encounter (Signed)
Refill done.  

## 2016-10-13 ENCOUNTER — Encounter: Payer: Self-pay | Admitting: Family Medicine

## 2016-11-06 NOTE — Progress Notes (Signed)
[name] [facility] [address] [place], [place] [address] Phone: [phone] Subjective:      CC: Bilateral hamstring pain Follow-up  BJY:[id number]:Subjective  Chase DeeBrian C Jensen is a 51 y.o. male coming in with complaint of hamstring pain. Patient was found to have more of a piriformis syndrome on the right side. Has been making progress overall. Very minimal discomfort. Has responded fairly well to osteopathic manipulation. No radicular symptoms at this time She states that he is having more of a right groin pain. After doing sit-ups. States though that does not hurt with activity. Not stopping him from activity. Denies any radiation down the leg.  She was given left elbow pain. Found to have more of a lateral epicondylitis. The percent better after doing physical therapy. Able to do daily activities..    Past Medical History:  Diagnosis Date  . Coronary artery disease   . Hyperlipemia   . Hypertension   . Hypothyroidism    Past Surgical History:  Procedure Laterality Date  . CARDIAC SURGERY    . CHOLECYSTECTOMY    . COLONOSCOPY WITH PROPOFOL N/A 07/29/2015   Procedure: COLONOSCOPY WITH PROPOFOL;  Surgeon: Elnita MaxwellMatthew Gordon Rein, MD;  [facility];  Service: Endoscopy;  Laterality: N/A;  . CORONARY ANGIOPLASTY    . HERNIA REPAIR    . NASAL SINUS SURGERY    . PILONIDAL CYST DRAINAGE    . TONSILLECTOMY     Social History   Social History  . Marital status: Married    Spouse name: N/A  . Number of children: N/A  . Years of education: N/A   Social History Main Topics  . Smoking status: Never Smoker  . Smokeless tobacco: Never Used  . Alcohol use None  . Drug use: Unknown  . Sexual activity: Not Asked   Other Topics Concern  . None   Social History Narrative  . None   Allergies  Allergen Reactions  . Penicillins Rash  . Propoxyphene Rash    Darvocet N Acetaminophen   No family history on file. Is any history of rheumatological  diseases.  Past medical history, social, surgical and family history all reviewed in electronic medical record.  No pertanent information unless stated regarding to the chief complaint.   Review of Systems: No headache, visual changes, nausea, vomiting, diarrhea, constipation, dizziness, abdominal pain, skin rash, fevers, chills, night sweats, weight loss, swollen lymph nodes, body aches, joint swelling, muscle aches, chest pain, shortness of breath, mood changes.       Objective  Blood pressure 138/80, pulse 62, height 6' (1.829 m), weight 221 lb (100.2 kg), SpO2 99 %.  Systems examined below as of 11/07/16 General: NAD A&O x3 mood, affect normal  HEENT: Pupils equal, extraocular movements intact no nystagmus Respiratory: not short of breath at rest or with speaking Cardiovascular: No lower extremity edema, non tender Skin: Warm dry intact with no signs of infection or rash on extremities or on axial skeleton. Abdomen: Soft nontender, no masses Neuro: Cranial nerves  intact, neurovascularly intact in all extremities with 2+ DTRs and 2+ pulses. Lymph: No lymphadenopathy appreciated today  Gait normal with good balance and coordination.  MSK: Non tender with full range of motion and good stability and symmetric strength and tone of shoulders,  wrist,  knee hips and ankles bilaterally.   Neck: Inspection unremarkable. No palpable stepoffs. Negative Spurling's maneuver. Mild loss of range of motion of the last 5-10. Grip strength and sensation normal in bilateral  hands Strength good C4 to T1 distribution No sensory change to C4 to T1 Negative Hoffman sign bilaterally Reflexes normal  Back Exam:  Inspection: Unremarkable  Motion: Flexion 45 deg, Extension 25 deg, Side Bending to 40 deg bilaterally,  Rotation to 40 deg bilaterally stable with range of motion SLR laying: Negative  XSLR laying: Negative  Palpable tenderness: Tender to palpation in the paraspinal musculature of the  lumbar spine mostly over L2-L4 on the right side. FABER: Still mild tightness of the right side stable from previous exam Sensory change: Gross sensation intact to all lumbar and sacral dermatomes.  Reflexes: 2+ at both patellar tendons, 2+ at achilles tendons, Babinski's downgoing.  Strength at foot  Plantar-flexion: 5/5 Dorsi-flexion: 5/5 Eversion: 5/5 Inversion: 5/5  Leg strength  Quad: 5/5 Hamstring: 5/5 Hip flexor: 5/5 Hip abductors: 4+/5 but symmetric Gait unremarkable.  Elbow: Left Unremarkable to inspection. Range of motion full pronation, supination, flexion, extension. Strength is full to all of the above directions Stable to varus, valgus stress. Negative moving valgus stress test. No discrete areas of tenderness to palpation. Ulnar nerve does not sublux. Negative cubital tunnel Tinel's. Contralateral elbow unremarkable.  Osteopathic findings Cervical C2 flexed rotated and side bent right C5 flexed rotated and side bent leftt T3 extended rotated and side bent right inhaled third rib T7 extended rotated and side bent left L1 flexed rotated and side bent right Sacrum right on right    Impression and Recommendations:     This case required medical decision making of moderate complexity.      Note: This dictation was prepared with Dragon dictation along with smaller phrase technology. Any transcriptional errors that result from this process are unintentional.

## 2016-11-07 ENCOUNTER — Ambulatory Visit (INDEPENDENT_AMBULATORY_CARE_PROVIDER_SITE_OTHER): Payer: Managed Care, Other (non HMO) | Admitting: Family Medicine

## 2016-11-07 ENCOUNTER — Encounter: Payer: Self-pay | Admitting: Family Medicine

## 2016-11-07 VITALS — BP 138/80 | HR 62 | Ht 72.0 in | Wt 221.0 lb

## 2016-11-07 DIAGNOSIS — M7712 Lateral epicondylitis, left elbow: Secondary | ICD-10-CM | POA: Diagnosis not present

## 2016-11-07 DIAGNOSIS — M76891 Other specified enthesopathies of right lower limb, excluding foot: Secondary | ICD-10-CM | POA: Diagnosis not present

## 2016-11-07 DIAGNOSIS — G5701 Lesion of sciatic nerve, right lower limb: Secondary | ICD-10-CM

## 2016-11-07 DIAGNOSIS — M999 Biomechanical lesion, unspecified: Secondary | ICD-10-CM

## 2016-11-07 NOTE — Assessment & Plan Note (Signed)
Significant improvement. Discussed ergonomics throughout the day and what activities to avoid. Follow-up again if any worsening symptoms.

## 2016-11-07 NOTE — Assessment & Plan Note (Signed)
2 having some mild tightness. Patient was found to have more of a hip flexor tendinitis noted today. Little different than previous exam. We discussed with patient at great length. Given new exercises. Discussed ergonomics. Patient with continue to be active. We'll space patient onto 6-8 week intervals.

## 2016-11-07 NOTE — Assessment & Plan Note (Signed)
Decision today to treat with OMT was based on Physical Exam  After verbal consent patient was treated with HVLA, ME, FPR techniques in cervical, thoracic, lumbar and sacral areas  Patient tolerated the procedure well with improvement in symptoms  Patient given exercises, stretches and lifestyle modifications  See medications in patient instructions if given  Patient will follow up in 6-8 weeks 

## 2016-11-07 NOTE — Patient Instructions (Signed)
Good to see you  Chase Jensen is your friend.  Make sure you stretch after the exercises  Add in  Y-T-A 2 seconds in each stop and 10 reps daily  Exercises on wall.  Heel and butt touching.  Raise leg 6 inches and hold 2 seconds.  Down slow for count of 4 seconds.  1 set of 30 reps daily on both sides.  Avoid extending back too far on the ball and stretch hip flexor after.  See me again in 6-8 weeks.

## 2016-12-18 NOTE — Progress Notes (Signed)
[name] [facility] [address] [place], [place] [address] Phone: [phone]([phone] Subjective:      CC: Bilateral hamstring pain Follow-upLow back pain follow-up  ZHY:QMVHQIONGEHPI:Subjective  Chase DeeBrian C Jensen is a 51 y.o. male coming in with complaint of hamstring pain. Patient was found to have more of a piriformis syndrome on the right side. Patient is responding very well to conservative therapy previously. Patient has been increasing activity. Patient didn't help one of his sons move and that seemed to cause more increasing tightness. Patient states overall seems to be improving at this time. Patient states that over the course last 10 days maybe he has noticed some increase in stiffness again. Hasn't felt a very well to manipulation of the past and wants to continue potential treatment.  Left elbow pain has resolved    Past Medical History:  Diagnosis Date  . Coronary artery disease   . Hyperlipemia   . Hypertension   . Hypothyroidism    Past Surgical History:  Procedure Laterality Date  . CARDIAC SURGERY    . CHOLECYSTECTOMY    . COLONOSCOPY WITH PROPOFOL N/A 07/29/2015   Procedure: COLONOSCOPY WITH PROPOFOL;  Surgeon: Elnita MaxwellMatthew Gordon Rein, MD;  Location[facility] END[facility]COPY;  Service: Endoscopy;  Laterality: N/A;  . CORONARY ANGIOPLASTY    . HERNIA REPAIR    . NASAL SINUS SURGERY    . PILONIDAL CYST DRAINAGE    . TONSILLECTOMY     Social History   Social History  . Marital status: Married    Spouse name: N/A  . Number of children: N/A  . Years of education: N/A   Social History Main Topics  . Smoking status: Never Smoker  . Smokeless tobacco: Never Used  . Alcohol use None  . Drug use: Unknown  . Sexual activity: Not Asked   Other Topics Concern  . None   Social History Narrative  . None   Allergies  Allergen Reactions  . Penicillins Rash  . Propoxyphene Rash    Darvocet N Acetaminophen   No family history on file. Is any history of rheumatological  diseases.  Past medical history, social, surgical and family history all reviewed in electronic medical record.  No pertanent information unless stated regarding to the chief complaint.   Review of Systems: No headache, visual changes, nausea, vomiting, diarrhea, constipation, dizziness, abdominal pain, skin rash, fevers, chills, night sweats, weight loss, swollen lymph nodes, body aches, joint swelling, muscle aches, chest pain, shortness of breath, mood changes.  .       Objective  Blood pressure 132/80, pulse 64, height 6' (1.829 m), weight 226 lb (102.5 kg).  Systems examined below as of 12/19/16 General: NAD A&O x3 mood, affect normal  HEENT: Pupils equal, extraocular movements intact no nystagmus Respiratory: not short of breath at rest or with speaking Cardiovascular: No lower extremity edema, non tender Skin: Warm dry intact with no signs of infection or rash on extremities or on axial skeleton. Abdomen: Soft nontender, no masses Neuro: Cranial nerves  intact, neurovascularly intact in all extremities with 2+ DTRs and 2+ pulses. Lymph: No lymphadenopathy appreciated today  Gait normal with good balance and coordination.  MSK: Non tender with full range of motion and good stability and symmetric strength and tone of shoulders, elbows, wrist,  knee hips and ankles bilaterally.   Neck: Inspection unremarkable. No palpable stepoffs. Negative Spurling's maneuver. Near full range of motion which is an improvement Grip strength and sensation normal in bilateral hands  Strength good C4 to T1 distribution No sensory change to C4 to T1 Negative Hoffman sign bilaterally Reflexes normal  Back Exam:  Inspection: Unremarkable  Motion: Flexion 45 deg, Extension 25 deg, Side Bending to 40 deg bilaterally,  Rotation to 40 deg bilaterally stable with range of motion SLR laying: Negative  XSLR laying: Negative  Palpable tenderness: Very mild discomfort more in the thoracolumbar juncture on  the right side FABER: Still mild tightness of the right side stable from previous exam Sensory change: Gross sensation intact to all lumbar and sacral dermatomes.  Reflexes: 2+ at both patellar tendons, 2+ at achilles tendons, Babinski's downgoing.  Strength at foot  Plantar-flexion: 5/5 Dorsi-flexion: 5/5 Eversion: 5/5 Inversion: 5/5  Leg strength  Quad: 5/5 Hamstring: 5/5 Hip flexor: 5/5 Hip abductors: 5 out of 5 but symmetric Gait unremarkable.  Osteopathic findings Cervical C2 flexed rotated and side bent right  T5 extended rotated and side bent right  T9 extended rotated and side bent left L2 flexed rotated and side bent right Sacrum right on right     Impression and Recommendations:     This case required medical decision making of moderate complexity.      Note: This dictation was prepared with Dragon dictation along with smaller phrase technology. Any transcriptional errors that result from this process are unintentional.

## 2016-12-19 ENCOUNTER — Ambulatory Visit (INDEPENDENT_AMBULATORY_CARE_PROVIDER_SITE_OTHER): Payer: Managed Care, Other (non HMO) | Admitting: Family Medicine

## 2016-12-19 ENCOUNTER — Encounter: Payer: Self-pay | Admitting: Family Medicine

## 2016-12-19 VITALS — BP 132/80 | HR 64 | Ht 72.0 in | Wt 226.0 lb

## 2016-12-19 DIAGNOSIS — M999 Biomechanical lesion, unspecified: Secondary | ICD-10-CM | POA: Diagnosis not present

## 2016-12-19 DIAGNOSIS — M76891 Other specified enthesopathies of right lower limb, excluding foot: Secondary | ICD-10-CM | POA: Diagnosis not present

## 2016-12-19 DIAGNOSIS — G5701 Lesion of sciatic nerve, right lower limb: Secondary | ICD-10-CM | POA: Diagnosis not present

## 2016-12-19 NOTE — Patient Instructions (Signed)
Good luck with move.  Ice is your friend Increase activity  If pain see me in 4 weeks otherwise 2 months

## 2016-12-19 NOTE — Assessment & Plan Note (Signed)
Still has tightness overall. Patient seems to be doing relatively well though. Increase patient's activity slowly. We discussed still taking different medications if needed. Patient though has been actually decreasing the medications on a regular basis. We discussed again core stability and strengthening. Follow-up again in 6-12 weeks.

## 2016-12-19 NOTE — Assessment & Plan Note (Signed)
Decision today to treat with OMT was based on Physical Exam  After verbal consent patient was treated with HVLA, ME, FPR techniques in cervical, thoracic, lumbar and sacral areas  Patient tolerated the procedure well with improvement in symptoms  Patient given exercises, stretches and lifestyle modifications  See medications in patient instructions if given  Patient will follow up in 6-12 weeks patient may come back sooner though secondary to helping another son move.

## 2016-12-19 NOTE — Assessment & Plan Note (Signed)
Decision today to treat with OMT was based on Physical Exam  After verbal consent patient was treated with HVLA, ME, FPR techniques in cervical, thoracic, lumbar and sacral areas  Patient tolerated the procedure well with improvement in symptoms  Patient given exercises, stretches and lifestyle modifications  See medications in patient instructions if given  Patient will follow up in 6-12 weeks patient may come back sooner though secondary to helping another son move. 

## 2017-01-23 ENCOUNTER — Ambulatory Visit: Payer: Managed Care, Other (non HMO) | Admitting: Family Medicine

## 2017-02-13 ENCOUNTER — Ambulatory Visit (INDEPENDENT_AMBULATORY_CARE_PROVIDER_SITE_OTHER): Payer: Managed Care, Other (non HMO) | Admitting: Family Medicine

## 2017-02-13 VITALS — BP 112/80 | HR 70 | Resp 16 | Wt 222.0 lb

## 2017-02-13 DIAGNOSIS — M999 Biomechanical lesion, unspecified: Secondary | ICD-10-CM | POA: Diagnosis not present

## 2017-02-13 DIAGNOSIS — M76891 Other specified enthesopathies of right lower limb, excluding foot: Secondary | ICD-10-CM | POA: Diagnosis not present

## 2017-02-13 NOTE — Patient Instructions (Signed)
Good to see you  You are doing great  Ice is your friend Keep doing the stretch and everything else you are doing  See me again in 2 months You will do great with your 5K

## 2017-02-13 NOTE — Assessment & Plan Note (Signed)
Still has some tightness but has work on his hip abductor strength  Discussed stretch Discussed what to avoid.  Stay active RTC in 2 months

## 2017-02-13 NOTE — Assessment & Plan Note (Signed)
Decision today to treat with OMT was based on Physical Exam  After verbal consent patient was treated with HVLA, ME, FPR techniques in cervical, thoracic, lumbar and sacral areas  Patient tolerated the procedure well with improvement in symptoms  Patient given exercises, stretches and lifestyle modifications  See medications in patient instructions if given  Patient will follow up in 8 weeks 

## 2017-02-13 NOTE — Progress Notes (Signed)
[name] [address] [place], [place] [address] Phone: [phone] Subjective:      CC: Bilateral hamstring pain Follow-upLow back pain follow-up  BJY:[id number]  Chase Jensen is a 51 y.o. male coming in with complaint of hamstring pain. Has done well with conservative therapy. P Started running again. No radiation of the pain. Rates the severity of  Pain as 4 out of 10. Started to increase activity and is happy with the results so far.      Past Medical History:  Diagnosis Date  . Coronary artery disease   . Hyperlipemia   . Hypertension   . Hypothyroidism    Past Surgical History:  Procedure Laterality Date  . CARDIAC SURGERY    . CHOLECYSTECTOMY    . COLONOSCOPY WITH PROPOFOL N/A 07/29/2015   Procedure: COLONOSCOPY WITH PROPOFOL;  Surgeon: Elnita Maxwell, MD;  Location: [facility] ENDOSCOPY;  Service: Endoscopy;  Laterality: N/A;  . CORONARY ANGIOPLASTY    . HERNIA REPAIR    . NASAL SINUS SURGERY    . PILONIDAL CYST DRAINAGE    . TONSILLECTOMY     Social History   Social History  . Marital status: Married    Spouse name: N/A  . Number of children: N/A  . Years of education: N/A   Social History Main Topics  . Smoking status: Never Smoker  . Smokeless tobacco: Never Used  . Alcohol use Not on file  . Drug use: Unknown  . Sexual activity: Not on file   Other Topics Concern  . Not on file   Social History Narrative  . No narrative on file   Allergies  Allergen Reactions  . Penicillins Rash  . Propoxyphene Rash    Darvocet N Acetaminophen   No family history on file. Is any history of rheumatological diseases.  Past medical history, social, surgical and family history all reviewed in electronic medical record.  No pertanent information unless stated regarding to the chief complaint.   Review of Systems: No headache, visual changes, nausea, vomiting, diarrhea, constipation, dizziness, abdominal pain, skin rash, fevers,  chills, night sweats, weight loss, swollen lymph nodes, body aches, joint swelling, muscle aches, chest pain, shortness of breath, mood changes.     Objective  Blood pressure 112/80, pulse 70, resp. rate 16, weight 222 lb (100.7 kg), SpO2 96 %.  Systems examined below as of 02/13/17 General: NAD A&O x3 mood, affect normal  HEENT: Pupils equal, extraocular movements intact no nystagmus Respiratory: not short of breath at rest or with speaking Cardiovascular: No lower extremity edema, non tender Skin: Warm dry intact with no signs of infection or rash on extremities or on axial skeleton. Abdomen: Soft nontender, no masses Neuro: Cranial nerves  intact, neurovascularly intact in all extremities with 2+ DTRs and 2+ pulses. Lymph: No lymphadenopathy appreciated today  Gait normal with good balance and coordination.  MSK: Non tender with full range of motion and good stability and symmetric strength and tone of shoulders, elbows, wrist,  knee hips and ankles bilaterally.   Neck: Inspection unremarkable. No palpable stepoffs. Negative Spurling's maneuver. Full neck range of motion Grip strength and sensation normal in bilateral hands Strength good C4 to T1 distribution No sensory change to C4 to T1 Negative Hoffman sign bilaterally Reflexes normal  Back Exam:  Inspection: Unremarkable  Motion: Flexion 45 deg, Extension 45 deg, Side Bending to 45 deg bilaterally,  Rotation to 45 deg bilaterally  SLR laying: Negative  XSLR laying:  Negative  Palpable tenderness: tender to palpation over the paraspinal. FABER: negative. Sensory change: Gross sensation intact to all lumbar and sacral dermatomes.  Reflexes: 2+ at both patellar tendons, 2+ at achilles tendons, Babinski's downgoing.  Strength at foot  Plantar-flexion: 5/5 Dorsi-flexion: 5/5 Eversion: 5/5 Inversion: 5/5  Leg strength  Quad: 5/5 Hamstring: 5/5 Hip flexor: 5/5 Hip abductors: 5/5  Gait unremarkable.  Osteopathic findings C2  flexed rotated and side bent right C4 flexed rotated and side bent left C7 flexed rotated and side bent left T3 extended rotated and side bent right inhaled third rib T10 extended rotated and side bent left L2 flexed rotated and side bent right Sacrum right on right     Impression and Recommendations:     This case required medical decision making of moderate complexity.      Note: This dictation was prepared with Dragon dictation along with smaller phrase technology. Any transcriptional errors that result from this process are unintentional.

## 2017-04-09 ENCOUNTER — Telehealth: Payer: Self-pay | Admitting: Cardiovascular Disease

## 2017-04-09 NOTE — Telephone Encounter (Signed)
Pt signed ROI Faxed to [facility] Clarisse Gougeowheard [facility]

## 2017-04-15 ENCOUNTER — Ambulatory Visit (INDEPENDENT_AMBULATORY_CARE_PROVIDER_SITE_OTHER): Payer: 59 | Admitting: Family Medicine

## 2017-04-15 ENCOUNTER — Encounter: Payer: Self-pay | Admitting: Family Medicine

## 2017-04-15 VITALS — BP 124/80 | HR 62 | Ht 72.0 in | Wt 220.0 lb

## 2017-04-15 DIAGNOSIS — M999 Biomechanical lesion, unspecified: Secondary | ICD-10-CM

## 2017-04-15 DIAGNOSIS — M76891 Other specified enthesopathies of right lower limb, excluding foot: Secondary | ICD-10-CM

## 2017-04-15 NOTE — Assessment & Plan Note (Signed)
Still significant tightness. Doing well overall. We discussed icing regimen and home exercises. Patient will continue with the manipulation. Continue same medications. Follow-up again in 3-4 months

## 2017-04-15 NOTE — Assessment & Plan Note (Signed)
Decision today to treat with OMT was based on Physical Exam  After verbal consent patient was treated with HVLA, ME, FPR techniques in cervical, thoracic, lumbar and sacral areas  Patient tolerated the procedure well with improvement in symptoms  Patient given exercises, stretches and lifestyle modifications  See medications in patient instructions if given  Patient will follow up in 1-2 weeks 

## 2017-04-15 NOTE — Progress Notes (Signed)
Chase ScaleZach Destynee Jensen D.O. [facility] [address] [place], [place] [address] Phone: [phone] Subjective:      CC: Bilateral hamstring pain Follow-upLow back pain follow-up  ZHY:QMVHQIONGEHPI:Subjective  Chase Jensen is a 51 y.o. male coming in with complaint of hamstring pain. Has done well with conservative therapy.Has done very well also with osteopathic manipulation. Patient has been able to compete in 5K races for the first time in many years. He even placing and third place. Therapy with the results of far. Discussed mild discomfort from time to time but nothing severe. States that over the course last 8 weeks has noticed improvements.     Past Medical History:  Diagnosis Date  . Coronary artery disease   . Hyperlipemia   . Hypertension   . Hypothyroidism    Past Surgical History:  Procedure Laterality Date  . CARDIAC SURGERY    . CHOLECYSTECTOMY    . COLONOSCOPY WITH PROPOFOL N/A 07/29/2015   Procedure: COLONOSCOPY WITH PROPOFOL;  Surgeon: Chase MaxwellMatthew Gordon Rein, MD;  [facility];  Service: Endoscopy;  Laterality: N/A;  . CORONARY ANGIOPLASTY    . HERNIA REPAIR    . NASAL SINUS SURGERY    . PILONIDAL CYST DRAINAGE    . TONSILLECTOMY     Social History   Social History  . Marital status: Married    Spouse name: N/A  . Number of children: N/A  . Years of education: N/A   Social History Main Topics  . Smoking status: Never Smoker  . Smokeless tobacco: Never Used  . Alcohol use None  . Drug use: Unknown  . Sexual activity: Not Asked   Other Topics Concern  . None   Social History Narrative  . None   Allergies  Allergen Reactions  . Penicillins Rash  . Propoxyphene Rash    Darvocet N Acetaminophen   No family history on file. Is any history of rheumatological diseases.  Past medical history, social, surgical and family history all reviewed in electronic medical record.  No pertanent information unless stated regarding to the chief complaint.    Review of Systems: No headache, visual changes, nausea, vomiting, diarrhea, constipation, dizziness, abdominal pain, skin rash, fevers, chills, night sweats, weight loss, swollen lymph nodes, body aches, joint swelling, muscle aches, chest pain, shortness of breath, mood changes.    Objective  Height 6' (1.829 m), weight 220 lb (99.8 kg).  Systems examined below as of 04/15/17 General: NAD A&O x3 mood, affect normal  HEENT: Pupils equal, extraocular movements intact no nystagmus Respiratory: not short of breath at rest or with speaking Cardiovascular: No lower extremity edema, non tender Skin: Warm dry intact with no signs of infection or rash on extremities or on axial skeleton. Abdomen: Soft nontender, no masses Neuro: Cranial nerves  intact, neurovascularly intact in all extremities with 2+ DTRs and 2+ pulses. Lymph: No lymphadenopathy appreciated today  Gait normal with good balance and coordination.  MSK: Non tender with full range of motion and good stability and symmetric strength and tone of shoulders, elbows, wrist,  knee hips and ankles bilaterally.   Back Exam:  Inspection: Unremarkable  Motion: Flexion 45 deg, Extension 25 deg, Side Bending to 45 deg bilaterally,  Rotation to 45 deg bilaterally  SLR laying: Negative  XSLR laying: Negative  Palpable tenderness: Mild tightness of the thoracolumbar juncture on the right side. FABER: negative. Sensory change: Gross sensation intact to all lumbar and sacral dermatomes.  Reflexes: 2+ at both patellar tendons, 2+  at achilles tendons, Babinski's downgoing.  Strength at foot  Plantar-flexion: 5/5 Dorsi-flexion: 5/5 Eversion: 5/5 Inversion: 5/5  Leg strength  Quad: 5/5 Hamstring: 5/5 Hip flexor: 5/5 Hip abductors: 4+/5  Gait unremarkable.   Osteopathic findings C2 flexed rotated and side bent right C4 flexed rotated and side bent left C6 flexed rotated and side bent left T3 extended rotated and side bent right inhaled third  rib T9 extended rotated and side bent left L2 flexed rotated and side bent right Sacrum right on right      Impression and Recommendations:     This case required medical decision making of moderate complexity.      Note: This dictation was prepared with Dragon dictation along with smaller phrase technology. Any transcriptional errors that result from this process are unintentional.

## 2017-04-15 NOTE — Patient Instructions (Signed)
Great to see you  Chase Jensen is your friend.  I am impressed Keep doing what you are doing Stretch the hip flexors after running See me again in 8 weeks

## 2017-04-17 ENCOUNTER — Encounter: Payer: Self-pay | Admitting: Family Medicine

## 2017-04-18 ENCOUNTER — Encounter: Payer: Self-pay | Admitting: Family Medicine

## 2017-06-14 DIAGNOSIS — G43409 Hemiplegic migraine, not intractable, without status migrainosus: Secondary | ICD-10-CM | POA: Insufficient documentation

## 2017-06-17 ENCOUNTER — Encounter: Payer: Self-pay | Admitting: Family Medicine

## 2017-06-17 ENCOUNTER — Ambulatory Visit (INDEPENDENT_AMBULATORY_CARE_PROVIDER_SITE_OTHER): Payer: 59 | Admitting: Family Medicine

## 2017-06-17 VITALS — BP 120/78 | HR 54 | Ht 72.0 in | Wt 226.0 lb

## 2017-06-17 DIAGNOSIS — G5701 Lesion of sciatic nerve, right lower limb: Secondary | ICD-10-CM

## 2017-06-17 DIAGNOSIS — M999 Biomechanical lesion, unspecified: Secondary | ICD-10-CM | POA: Diagnosis not present

## 2017-06-17 NOTE — Assessment & Plan Note (Addendum)
Decision today to treat with OMT was based on Physical Exam  After verbal consent patient was treated with HVLA, ME, FPR techniques in cervical, thoracic, lumbar and sacral areas  Patient tolerated the procedure well with improvement in symptoms  Patient given exercises, stretches and lifestyle modifications  See medications in patient instructions if given  Patient will follow up in 6 weeks 

## 2017-06-17 NOTE — Assessment & Plan Note (Signed)
Discussed with patient.  RTC in 6 weeks.  Continue HEP, core strength

## 2017-06-17 NOTE — Progress Notes (Signed)
Chase ScaleZach Jensen D.O. [facility] [address] [place], [place] [address] Phone: [phone]) [phone] Subjective:    I'm seeing this patient by the request  of:    CC: Low back in piriformis follow-up  BJY:NWGNFAOZHYHPI:Subjective  Chase Jensen is a 51 y.o. male coming in with complaint of low back in piriformis. Has been doing relatively well. Not running on because still having some hamstring tenderness. Patient denies any numbness, any radiation of tingling, patient [place] sitting for driving longer periods of time has been more difficult. Patient is frustrated and wanted to back to running though. Patient continues to vitamin supplementations.    Past Medical History:  Diagnosis Date  . Coronary artery disease   . Hyperlipemia   . Hypertension   . Hypothyroidism    Past Surgical History:  Procedure Laterality Date  . CARDIAC SURGERY    . CHOLECYSTECTOMY    . COLONOSCOPY WITH PROPOFOL N/A 07/29/2015   Procedure: COLONOSCOPY WITH PROPOFOL;  Surgeon: Chase MaxwellMatthew Gordon Rein, MD;  [facility];  Service: Endoscopy;  Laterality: N/A;  . CORONARY ANGIOPLASTY    . HERNIA REPAIR    . NASAL SINUS SURGERY    . PILONIDAL CYST DRAINAGE    . TONSILLECTOMY     Social History   Social History  . Marital status: Married    Spouse name: N/A  . Number of children: N/A  . Years of education: N/A   Social History Main Topics  . Smoking status: Never Smoker  . Smokeless tobacco: Never Used  . Alcohol use None  . Drug use: Unknown  . Sexual activity: Not Asked   Other Topics Concern  . None   Social History Narrative  . None   Allergies  Allergen Reactions  . Penicillins Rash  . Propoxyphene Rash    Darvocet N Acetaminophen   No family history on file.   Past medical history, social, surgical and family history all reviewed in electronic medical record.  No pertanent information unless stated regarding to the chief complaint.   Review of Systems:Review of systems updated  and as accurate as of 06/17/17  No headache, visual changes, nausea, vomiting, diarrhea, constipation, dizziness, abdominal pain, skin rash, fevers, chills, night sweats, weight loss, swollen lymph nodes, body aches, joint swelling, chest pain, shortness of breath, mood changes.   Objective  Height 6' (1.829 m), weight 226 lb (102.5 kg). Systems examined below as of 06/17/17   General: No apparent distress alert and oriented x3 mood and affect normal, dressed appropriately.  HEENT: Pupils equal, extraocular movements intact  Respiratory: Patient's speak in full sentences and does not appear short of breath  Cardiovascular: No lower extremity edema, non tender, no erythema  Skin: Warm dry intact with no signs of infection or rash on extremities or on axial skeleton.  Abdomen: Soft nontender  Neuro: Cranial nerves II through XII are intact, neurovascularly intact in all extremities with 2+ DTRs and 2+ pulses.  Lymph: No lymphadenopathy of posterior or anterior cervical chain or axillae bilaterally.  Gait normal with good balance and coordination.  MSK:  Non tender with full range of motion and good stability and symmetric strength and tone of shoulders, elbows, wrist, hip, knee and ankles bilaterally.  Back Exam:  Inspection: Unremarkable  Motion: Flexion 45 deg, Extension 25 deg, Side Bending to 35 deg bilaterally,  Rotation to 45 deg bilaterally  SLR laying: Negative  XSLR laying: Negative  Palpable tenderness: Tender to palpation of the paraspinal musculature.  FABER: Tightness of the left. Sensory change: Gross sensation intact to all lumbar and sacral dermatomes.  Reflexes: 2+ at both patellar tendons, 2+ at achilles tendons, Babinski's downgoing.  Strength at foot  Plantar-flexion: 5/5 Dorsi-flexion: 5/5 Eversion: 5/5 Inversion: 5/5  Leg strength  Quad: 5/5 Hamstring: 5/5 Hip flexor: 5/5 Hip abductors: 5/5  Gait unremarkable.  Osteopathic findings C2 flexed rotated and side bent  right C4 flexed rotated and side bent left C7 flexed rotated and side bent left T3 extended rotated and side bent right inhaled third rib T11 extended rotated and side bent left L2 flexed rotated and side bent right Sacrum right on right    Impression and Recommendations:     This case required medical decision making of moderate complexity.      Note: This dictation was prepared with Dragon dictation along with smaller phrase technology. Any transcriptional errors that result from this process are unintentional.

## 2017-06-17 NOTE — Patient Instructions (Signed)
Good to see you  Overall not too shabby  Stay active.  Ice when you need it Thigh compression sleeve when you start hto run  Start a walk-run progression: - Initially start one minute walking than one minute running for 30 mins afterwards.  Once you have reached 30 mins: - Run 2 mins, then walk 1 min first week.  -Then run 3 mins, and walk 1 min second week.  -Then run 4 mins, and walk 1 min. -Then run 5 mins, and walk 1 min. -Slowly build up weekly to running 30 mins nonstop.  If painful at any of the steps, back up one step. See me again in 6 weeks.

## 2017-06-24 ENCOUNTER — Ambulatory Visit (INDEPENDENT_AMBULATORY_CARE_PROVIDER_SITE_OTHER): Payer: 59 | Admitting: Cardiovascular Disease

## 2017-06-24 ENCOUNTER — Encounter: Payer: Self-pay | Admitting: Cardiovascular Disease

## 2017-06-24 VITALS — BP 100/74 | HR 63 | Ht 72.0 in | Wt 223.5 lb

## 2017-06-24 DIAGNOSIS — I2510 Atherosclerotic heart disease of native coronary artery without angina pectoris: Secondary | ICD-10-CM | POA: Diagnosis not present

## 2017-06-24 DIAGNOSIS — I10 Essential (primary) hypertension: Secondary | ICD-10-CM | POA: Diagnosis not present

## 2017-06-24 DIAGNOSIS — E785 Hyperlipidemia, unspecified: Secondary | ICD-10-CM

## 2017-06-24 NOTE — Progress Notes (Signed)
Cardiology Office Note   Date:  06/24/2017   ID:  Chase Jensen, DOB April 15, 1966, MRN [id number]  PCP:  Kandyce Rud, MD  Cardiologist:   Lorine Bears, MD   Chief Complaint  Patient presents with  . New Patient (Initial Visit)    hx HTN, family history heart disease. Denies chest pain, shortness of breath, dizziness, or swelling.      History of Present Illness: Chase Jensen is a 51 y.o. male who was referred by Dr. Larwance Sachs to establish cardiovascular care. He had his prior care and [place] and then [facility]. He underwent balloon angioplasty without stent placement in 2006 in [place]. At that time, he was told that no stent was placed due to bifurcation location. He has known history of hypertension and severe hyperlipidemia. There is family history of premature coronary artery disease. His father had CABG at the age of 70 and did after a few months of his surgery. His mother had cancer. 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 He reports having stress testing done a few times in the past most recently in 2015 which was unremarkable. He denies any chest pain, shortness of breath or palpitations. He is to be more active and exercises but has not done so since he pulled his hamstring.    Past Medical History:  Diagnosis Date  . Coronary artery disease 2006    balloon angioplasty without stent placement to one-vessel done at [facility] in [place]   . Hyperlipemia   . Hypertension   . Hypothyroidism     Past Surgical History:  Procedure Laterality Date  . CARDIAC SURGERY    . CHOLECYSTECTOMY    . COLONOSCOPY WITH PROPOFOL N/A 07/29/2015   Procedure: COLONOSCOPY WITH PROPOFOL;  Surgeon: Elnita Maxwell, MD;  Location: [facility] ENDOSCOPY;  Service: Endoscopy;  Laterality: N/A;  . CORONARY ANGIOPLASTY    . HERNIA REPAIR    . NASAL SINUS SURGERY    . PILONIDAL CYST DRAINAGE    . TONSILLECTOMY        Current Outpatient Prescriptions  Medication Sig Dispense Refill  . aspirin (ASPIRIN EC) 81 MG EC tablet Take 81 mg by mouth daily. Swallow whole.    . levothyroxine (SYNTHROID, LEVOTHROID) 75 MCG tablet Take 75 mcg by mouth daily before breakfast.    . lisinopril (PRINIVIL,ZESTRIL) 10 MG tablet Take 5 mg by mouth.     . rosuvastatin (CRESTOR) 40 MG tablet Take 40 mg by mouth daily.    . SUMAtriptan (IMITREX) 100 MG tablet      No current facility-administered medications for this visit.     Allergies:   Penicillins and Propoxyphene    Social History:  The patient  reports that he has never smoked. He has never used smokeless tobacco.   Family History:  The patient's Family history is remarkable for premature coronary artery disease. Father had CABG at the age of 34.   ROS:  Please see the history of present illness.   Otherwise, review of systems are positive for none.   All other systems are reviewed and negative.    PHYSICAL EXAM: VS:  BP 100/74 (BP Location: Right Arm, Patient Position: Sitting, Cuff Size: Normal)   Pulse 63   Ht 6' (1.829 m)   Wt 223 lb 8 oz (101.4 kg)   BMI 30.31 kg/m  , BMI Body mass index is 30.31 kg/m. GEN: Well nourished, well developed, in no acute  distress  HEENT: normal  Neck: no JVD, carotid bruits, or masses Cardiac: RRR; no murmurs, rubs, or gallops,no edema  Respiratory:  clear to auscultation bilaterally, normal work of breathing GI: soft, nontender, nondistended, + BS MS: no deformity or atrophy  Skin: warm and dry, no rash Neuro:  Strength and sensation are intact Psych: euthymic mood, full affect   EKG:  EKG is ordered today. The ekg ordered today demonstrates normal sinus rhythm with no significant ST or T wave changes.   Recent Labs: No results found for requested labs within last 8760 hours.    Lipid Panel No results found for: CHOL, TRIG, HDL, CHOLHDL, VLDL, LDLCALC, LDLDIRECT    Wt Readings from Last 3  Encounters:  06/24/17 223 lb 8 oz (101.4 kg)  06/17/17 226 lb (102.5 kg)  04/15/17 220 lb (99.8 kg)     PAD Screen 06/24/2017  Previous PAD dx? No  Previous surgical procedure? No  Pain with walking? No  Feet/toe relief with dangling? No  Painful, non-healing ulcers? No  Extremities discolored? No      ASSESSMENT AND PLAN:  1.  Coronary artery disease involving native coronary arteries without angina: He is doing very well with no anginal symptoms. Cardiac exam is normal and baseline ECG is unremarkable. Continue medical therapy.  2. Hyperlipidemia: I reviewed most recent lipid profile with him. Most recent LDL was 86. The case to be better than that in the past likely due to the fact that he was exercising. He is planning to resume exercising. I explained to him that if he cannot get LDL below 70, we can consider adding Zetia.  3. Essential hypertension: Blood pressure is controlled on small dose losartan.    Disposition:   FU with me in 1 year  Signed,  Lorine Bears, MD  06/24/2017 5:26 PM    [facility]

## 2017-06-24 NOTE — Patient Instructions (Signed)
Medication Instructions: Continue same medications.   Labwork: None.   Procedures/Testing: None.   Follow-Up: 1 year with Dr. Selim Durden.   Any Additional Special Instructions Will Be Listed Below (If Applicable).     If you need a refill on your cardiac medications before your next appointment, please call your pharmacy.   

## 2017-07-02 ENCOUNTER — Encounter: Payer: Self-pay | Admitting: Cardiovascular Disease

## 2017-07-29 ENCOUNTER — Encounter: Payer: Self-pay | Admitting: Family Medicine

## 2017-07-29 ENCOUNTER — Ambulatory Visit: Payer: 59 | Admitting: Family Medicine

## 2017-07-29 ENCOUNTER — Ambulatory Visit (INDEPENDENT_AMBULATORY_CARE_PROVIDER_SITE_OTHER)
Admission: RE | Admit: 2017-07-29 | Discharge: 2017-07-29 | Disposition: A | Discharge status: Home / Self Care | Payer: 59 | Source: Ambulatory Visit | Attending: Family Medicine | Admitting: Family Medicine

## 2017-07-29 ENCOUNTER — Other Ambulatory Visit (INDEPENDENT_AMBULATORY_CARE_PROVIDER_SITE_OTHER): Payer: 59

## 2017-07-29 ENCOUNTER — Ambulatory Visit (INDEPENDENT_AMBULATORY_CARE_PROVIDER_SITE_OTHER): Payer: 59 | Admitting: Family Medicine

## 2017-07-29 VITALS — BP 134/82 | HR 72 | Ht 72.0 in | Wt 220.0 lb

## 2017-07-29 DIAGNOSIS — M76891 Other specified enthesopathies of right lower limb, excluding foot: Secondary | ICD-10-CM | POA: Diagnosis not present

## 2017-07-29 DIAGNOSIS — M999 Biomechanical lesion, unspecified: Secondary | ICD-10-CM | POA: Diagnosis not present

## 2017-07-29 DIAGNOSIS — M2550 Pain in unspecified joint: Secondary | ICD-10-CM | POA: Diagnosis not present

## 2017-07-29 LAB — CBC WITH DIFFERENTIAL/PLATELET
BASOS PCT: 0.7 % (ref 0.0–3.0)
Basophils Absolute: 0 10*3/uL (ref 0.0–0.1)
Eosinophils Absolute: 0.1 10*3/uL (ref 0.0–0.7)
Eosinophils Relative: 2.1 % (ref 0.0–5.0)
HEMATOCRIT: 45.3 % (ref 39.0–52.0)
Hemoglobin: 15.1 g/dL (ref 13.0–17.0)
LYMPHS ABS: 1.8 10*3/uL (ref 0.7–4.0)
LYMPHS PCT: 37.3 % (ref 12.0–46.0)
MCHC: 33.4 g/dL (ref 30.0–36.0)
MCV: 90 fl (ref 78.0–100.0)
MONOS PCT: 7.8 % (ref 3.0–12.0)
Monocytes Absolute: 0.4 10*3/uL (ref 0.1–1.0)
NEUTROS ABS: 2.5 10*3/uL (ref 1.4–7.7)
Neutrophils Relative %: 52.1 % (ref 43.0–77.0)
PLATELETS: 199 10*3/uL (ref 150.0–400.0)
RBC: 5.03 Mil/uL (ref 4.22–5.81)
RDW: 13.8 % (ref 11.5–15.5)
WBC: 4.9 10*3/uL (ref 4.0–10.5)

## 2017-07-29 LAB — TESTOSTERONE: Testosterone: 361.24 ng/dL (ref 300.00–890.00)

## 2017-07-29 LAB — COMPREHENSIVE METABOLIC PANEL
ALT: 24 U/L (ref 0–53)
AST: 24 U/L (ref 0–37)
Albumin: 4.4 g/dL (ref 3.5–5.2)
Alkaline Phosphatase: 56 U/L (ref 39–117)
BUN: 15 mg/dL (ref 6–23)
CALCIUM: 10 mg/dL (ref 8.4–10.5)
CHLORIDE: 103 meq/L (ref 96–112)
CO2: 29 meq/L (ref 19–32)
CREATININE: 1.02 mg/dL (ref 0.40–1.50)
GFR: 81.15 mL/min (ref >60.00)
GLUCOSE: 80 mg/dL (ref 70–99)
Potassium: 4 mEq/L (ref 3.5–5.1)
Sodium: 140 mEq/L (ref 135–145)
Total Bilirubin: 0.5 mg/dL (ref 0.2–1.2)
Total Protein: 7.5 g/dL (ref 6.0–8.3)

## 2017-07-29 LAB — SEDIMENTATION RATE: Sed Rate: 2 mm/hr (ref 0–20)

## 2017-07-29 LAB — IBC PANEL
Iron: 67 ug/dL (ref 42–165)
SATURATION RATIOS: 18.8 % — AB (ref 20.0–50.0)
TRANSFERRIN: 254 mg/dL (ref 212.0–360.0)

## 2017-07-29 LAB — VITAMIN D 25 HYDROXY (VIT D DEFICIENCY, FRACTURES): VITD: 28.76 ng/mL — ABNORMAL LOW (ref 30.00–100.00)

## 2017-07-29 LAB — C-REACTIVE PROTEIN: CRP: 0.1 mg/dL — AB (ref 0.5–20.0)

## 2017-07-29 LAB — TSH: TSH: 6.51 u[IU]/mL — AB (ref 0.35–4.50)

## 2017-07-29 MED ORDER — GABAPENTIN 100 MG PO CAPS: 200.0000 mg | ORAL_CAPSULE | Freq: Every day | ORAL | 0 refills | Status: DC

## 2017-07-29 NOTE — Progress Notes (Signed)
Chase Jensen D.O. [facility] [address] [place], [place] [address] Phone: [phone]) [phone] Subjective:      CC: Bilateral hip pain  NGE:[id number]:Subjective  Chase DeeBrian C Jensen is a 51 y.o. male coming in with complaint of bilateral hip pain. Patient did initially have more of a piriformis syndrome but now seems to be on the anterior aspect of the hips. States that sometimes it is severe enough that he cannot move. States and is more of a tightness. Trying to run still on a regular basis. States that he has to stop daily activities now. Becoming more frustrated. Patient is concerned because he would like to continue to be active but finds it difficult to do such pain. Notices it sitting for long amount of time causes more problems      Past Medical History:  Diagnosis Date  . Coronary artery disease 2006    balloon angioplasty without stent placement to one-vessel done at [facility] in [place]   . Hyperlipemia   . Hypertension   . Hypothyroidism    Past Surgical History:  Procedure Laterality Date  . CARDIAC SURGERY    . CHOLECYSTECTOMY    . COLONOSCOPY WITH PROPOFOL N/A 07/29/2015   Procedure: COLONOSCOPY WITH PROPOFOL;  Surgeon: Elnita MaxwellMatthew Gordon Rein, MD;  Location: [facility] ENDOSCOPY;  Service: Endoscopy;  Laterality: N/A;  . CORONARY ANGIOPLASTY    . HERNIA REPAIR    . NASAL SINUS SURGERY    . PILONIDAL CYST DRAINAGE    . TONSILLECTOMY     Social History   Social History  . Marital status: Married    Spouse name: N/A  . Number of children: N/A  . Years of education: N/A   Social History Main Topics  . Smoking status: Never Smoker  . Smokeless tobacco: Never Used  . Alcohol use None  . Drug use: Unknown  . Sexual activity: Not Asked   Other Topics Concern  . None   Social History Narrative  . None   Allergies  Allergen Reactions  . Penicillins Rash  . Propoxyphene Rash    Darvocet N Acetaminophen   No family history on file.   Past medical  history, social, surgical and family history all reviewed in electronic medical record.  No pertanent information unless stated regarding to the chief complaint.   Review of Systems:Review of systems updated and as accurate as of 07/29/17  No headache, visual changes, nausea, vomiting, diarrhea, constipation, dizziness, abdominal pain, skin rash, fevers, chills, night sweats, weight loss, swollen lymph nodes, body aches, joint swelling, chest pain, shortness of breath, mood changes. Positive muscle aches  Objective  Blood pressure 134/82, pulse 72, height 6' (1.829 m), weight 220 lb (99.8 kg), SpO2 94 %. Systems examined below as of 07/29/17   General: No apparent distress alert and oriented x3 mood and affect normal, dressed appropriately.  HEENT: Pupils equal, extraocular movements intact  Respiratory: Patient's speak in full sentences and does not appear short of breath  Cardiovascular: No lower extremity edema, non tender, no erythema  Skin: Warm dry intact with no signs of infection or rash on extremities or on axial skeleton.  Abdomen: Soft nontender  Neuro: Cranial nerves II through XII are intact, neurovascularly intact in all extremities with 2+ DTRs and 2+ pulses.  Lymph: No lymphadenopathy of posterior or anterior cervical chain or axillae bilaterally.  Gait normal with good balance and coordination.  MSK:  Non tender with full range of motion and good stability and symmetric  strength and tone of shoulders, elbows, wrist,  knee and ankles bilaterally.  Hip: Bilateral ROM IR:25 Deg, ER: 45 Deg, Flexion: 120 Deg, Extension: 100 Deg, Abduction: 45 Deg, Adduction: 45 Deg Strength IR: 5/5, ER: 5/5, Flexion: 5/5, Extension: 5/5, Abduction: 4/5, Adduction: 5/5 Pelvic alignment unremarkable to inspection and palpation. Standing hip rotation and gait without trendelenburg sign / unsteadiness. Greater trochanter without tenderness to palpation. No tenderness over piriformis and greater  trochanter. No pain with FABER or FADIR. Tightness on the right side with Faber Pain on the right side  Osteopathic findings  C2 flexed rotated and side bent right C4 flexed rotated and side bent left C6 flexed rotated and side bent left T3 extended rotated and side bent right inhaled third rib T9 extended rotated and side bent left L2 flexed rotated and side bent right Sacrum right on right    Impression and Recommendations:     This case required medical decision making of moderate complexity.      Note: This dictation was prepared with Dragon dictation along with smaller phrase technology. Any transcriptional errors that result from this process are unintentional.

## 2017-07-29 NOTE — Assessment & Plan Note (Signed)
I do believe the patient is having more of a hip flexor tendinitis but the pain does not seem to be improving. X-rays ordered today for further evaluation for any bony upper malady that could be contributing. Attempted osteopathic manipulation again and hopefully we'll be helpful. We discussed with patient about other laboratory workup could be beneficial and these were ordered today. Make sure that there is no other inflammatory pathology that could be contribute in. Given some gabapentin to help with nighttime pain. Follow-up again with me in 4 weeks.

## 2017-07-29 NOTE — Assessment & Plan Note (Signed)
Decision today to treat with OMT was based on Physical Exam  After verbal consent patient was treated with HVLA, ME, FPR techniques in cervical, thoracic, rib, lumbar and sacral areas  Patient tolerated the procedure well with improvement in symptoms  Patient given exercises, stretches and lifestyle modifications  See medications in patient instructions if given  Patient will follow up in 4 weeks 

## 2017-07-29 NOTE — Patient Instructions (Signed)
Good to see you Ice is your friend Lets get labs and xray and make sure we are not missing anything.  Gabapentin 200mg  at night Lets try an adjustable standing desk  You will do good.  See me again in 4 weeks

## 2017-07-30 LAB — CALCIUM, IONIZED: Calcium, Ion: 5.5 mg/dL (ref 4.8–5.6)

## 2017-07-30 LAB — RHEUMATOID FACTOR

## 2017-07-30 LAB — ANGIOTENSIN CONVERTING ENZYME: ANGIOTENSIN-CONVERTING ENZYME: 6 U/L — AB (ref 9–67)

## 2017-07-30 LAB — ANA: ANA: NEGATIVE

## 2017-07-30 LAB — CYCLIC CITRUL PEPTIDE ANTIBODY, IGG: Cyclic Citrullin Peptide Ab: <16 UNITS

## 2017-07-30 MED ORDER — GABAPENTIN 100 MG PO CAPS: 200.0000 mg | ORAL_CAPSULE | Freq: Every day | ORAL | 0 refills | Status: DC

## 2017-08-22 ENCOUNTER — Ambulatory Visit: Payer: 59 | Admitting: Family Medicine

## 2017-08-22 ENCOUNTER — Encounter: Payer: Self-pay | Admitting: Family Medicine

## 2017-08-22 VITALS — BP 120/86 | HR 75 | Ht 72.0 in | Wt 218.0 lb

## 2017-08-22 DIAGNOSIS — M999 Biomechanical lesion, unspecified: Secondary | ICD-10-CM

## 2017-08-22 DIAGNOSIS — M76891 Other specified enthesopathies of right lower limb, excluding foot: Secondary | ICD-10-CM

## 2017-08-22 NOTE — Assessment & Plan Note (Signed)
Still believe the patient's pain is secondary to more of muscle tightness.  We discussed with patient again in great length, home exercise, which activities to do which wants to avoid.  Patient is to increase activity slowly over the course the next several days.  Responds well to osteopathic manipulation.  Patient if any other changes will come back sooner.  Otherwise we will see him again in 4-6 weeks.

## 2017-08-22 NOTE — Assessment & Plan Note (Signed)
Decision today to treat with OMT was based on Physical Exam  After verbal consent patient was treated with HVLA, ME, FPR techniques in cervical, thoracic, rib, lumbar and sacral areas  Patient tolerated the procedure well with improvement in symptoms  Patient given exercises, stretches and lifestyle modifications  See medications in patient instructions if given  Patient will follow up in 3-4 weeks 

## 2017-08-22 NOTE — Progress Notes (Signed)
[name] [facility] [address] [place], [place] [address] Phone: [phone] Subjective:    I'm seeing this patient by the request  of:    CC:   NGE:XBMWUXLKGMHPI:Subjective  Chase Jensen is a 51 y.o. male coming in for follow up for back pain. He has been doing ok since last visit. Today his back is feeling good.       Past Medical History:  Diagnosis Date  . Coronary artery disease 2006    balloon angioplasty without stent placement to one-vessel done at [facility] [place]   . Hyperlipemia   . Hypertension   . Hypothyroidism    Past Surgical History:  Procedure Laterality Date  . CARDIAC SURGERY    . CHOLECYSTECTOMY    . COLONOSCOPY WITH PROPOFOL N/A 07/29/2015   Procedure: COLONOSCOPY WITH PROPOFOL;  Surgeon: Elnita MaxwellMatthew Gordon Rein, MD;  Location[facility] END[facility]COPY;  Service: Endoscopy;  Laterality: N/A;  . CORONARY ANGIOPLASTY    . HERNIA REPAIR    . NASAL SINUS SURGERY    . PILONIDAL CYST DRAINAGE    . TONSILLECTOMY     Social History   Socioeconomic History  . Marital status: Married    Spouse name: Not on file  . Number of children: Not on file  . Years of education: Not on file  . Highest education level: Not on file  Social Needs  . Financial resource strain: Not on file  . Food insecurity - worry: Not on file  . Food insecurity - inability: Not on file  . Transportation needs - medical: Not on file  . Transportation needs - non-medical: Not on file  Occupational History  . Not on file  Tobacco Use  . Smoking status: Never Smoker  . Smokeless tobacco: Never Used  Substance and Sexual Activity  . Alcohol use: Not on file  . Drug use: Not on file  . Sexual activity: Not on file  Other Topics Concern  . Not on file  Social History Narrative  . Not on file   Allergies  Allergen Reactions  . Penicillins Rash  . Propoxyphene Rash    Darvocet N Acetaminophen   No family history on file.   Past medical history, social,  surgical and family history all reviewed in electronic medical record.  No pertanent information unless stated regarding to the chief complaint.   Review of Systems:Review of systems updated and as accurate as of 08/22/17  No headache, visual changes, nausea, vomiting, diarrhea, constipation, dizziness, abdominal pain, skin rash, fevers, chills, night sweats, weight loss, swollen lymph nodes, body aches, joint swelling, muscle aches, chest pain, shortness of breath, mood changes.   Objective  There were no vitals taken for this visit. Systems examined below as of 08/22/17   General: No apparent distress alert and oriented x3 mood and affect normal, dressed appropriately.  HEENT: Pupils equal, extraocular movements intact  Respiratory: Patient's speak in full sentences and does not appear short of breath  Cardiovascular: No lower extremity edema, non tender, no erythema  Skin: Warm dry intact with no signs of infection or rash on extremities or on axial skeleton.  Abdomen: Soft nontender  Neuro: Cranial nerves II through XII are intact, neurovascularly intact in all extremities with 2+ DTRs and 2+ pulses.  Lymph: No lymphadenopathy of posterior or anterior cervical chain or axillae bilaterally.  Gait normal with good balance and coordination.  MSK:  Non tender with full range of motion and good stability  and symmetric strength and tone of shoulders, elbows, wrist, hip, knee and ankles bilaterally.     Impression and Recommendations:     This case required medical decision making of moderate complexity.      Note: This dictation was prepared with Dragon dictation along with smaller phrase technology. Any transcriptional errors that result from this process are unintentional.

## 2017-08-22 NOTE — Patient Instructions (Signed)
Good to see you  Ice is your friend Lets see what happens when the iron kicks in  Stay active and do the exercises Happy holidays!  See me again in 4 weeks

## 2017-08-22 NOTE — Progress Notes (Signed)
[name] [facility] [address] [place], [place] [address] Phone: [phone]) [phone] Subjective:      CC: Bilateral hip pain follow-up  ION:[id number]:Subjective  Chase Jensen is a 51 y.o. male coming in with complaint of hip pain follow-up.  Patient was having significant amount of discomfort.  Seem to be on the inferior aspect.  X-rays were taken and independently visualized by me showing no significant impingement.  Patient did have laboratory workup secondary to the amount of pain he was having was found to have iron deficiency of vitamin D.  Patient has been making some progress.  Increasing activity.  Denies any numbness or tingling.  Denies any radiation down the legs.      Past Medical History:  Diagnosis Date  . Coronary artery disease 2006    balloon angioplasty without stent placement to one-vessel done at [facility] [place]   . Hyperlipemia   . Hypertension   . Hypothyroidism    Past Surgical History:  Procedure Laterality Date  . CARDIAC SURGERY    . CHOLECYSTECTOMY    . COLONOSCOPY WITH PROPOFOL N/A 07/29/2015   Procedure: COLONOSCOPY WITH PROPOFOL;  Surgeon: Elnita MaxwellMatthew Gordon Rein, MD;  [facility] END[facility]COPY;  Service: Endoscopy;  Laterality: N/A;  . CORONARY ANGIOPLASTY    . HERNIA REPAIR    . NASAL SINUS SURGERY    . PILONIDAL CYST DRAINAGE    . TONSILLECTOMY     Social History   Socioeconomic History  . Marital status: Married    Spouse name: None  . Number of children: None  . Years of education: None  . Highest education level: None  Social Needs  . Financial resource strain: None  . Food insecurity - worry: None  . Food insecurity - inability: None  . Transportation needs - medical: None  . Transportation needs - non-medical: None  Occupational History  . None  Tobacco Use  . Smoking status: Never Smoker  . Smokeless tobacco: Never Used  Substance and Sexual Activity  . Alcohol use: None  . Drug use: None  .  Sexual activity: None  Other Topics Concern  . None  Social History Narrative  . None   Allergies  Allergen Reactions  . Penicillins Rash  . Propoxyphene Rash    Darvocet N Acetaminophen   No family history on file.   Past medical history, social, surgical and family history all reviewed in electronic medical record.  No pertanent information unless stated regarding to the chief complaint.   Review of Systems:Review of systems updated and as accurate as of 08/22/17  No headache, visual changes, nausea, vomiting, diarrhea, constipation, dizziness, abdominal pain, skin rash, fevers, chills, night sweats, weight loss, swollen lymph nodes, body aches, joint swelling, muscle aches, chest pain, shortness of breath, mood changes.   Objective  Blood pressure 120/86, pulse 75, height 6' (1.829 m), weight 218 lb (98.9 kg), SpO2 98 %. Systems examined below as of 08/22/17   General: No apparent distress alert and oriented x3 mood and affect normal, dressed appropriately.  HEENT: Pupils equal, extraocular movements intact  Respiratory: Patient's speak in full sentences and does not appear short of breath  Cardiovascular: No lower extremity edema, non tender, no erythema  Skin: Warm dry intact with no signs of infection or rash on extremities or on axial skeleton.  Abdomen: Soft nontender  Neuro: Cranial nerves II through XII are intact, neurovascularly intact in all extremities with 2+ DTRs and 2+ pulses.  Lymph: No lymphadenopathy of posterior or anterior cervical chain or axillae bilaterally.  Gait normal with good balance and coordination.  MSK:  Non tender with full range of motion and good stability and symmetric strength and tone of shoulders, elbows, wrist, , knee and ankles bilaterally.  Back Exam:  Inspection: Unremarkable  Motion: Flexion 35 deg, Extension 25 deg, Side Bending to 45 deg bilaterally,  Rotation to 45 deg bilaterally  SLR laying: Negative  XSLR laying: Negative    Palpable tenderness: Mild discomfort over the anterior aspect of the hips bilaterally.  Also still pain over the lumbosacral area bilaterally right greater than left. FABER: negative. Sensory change: Gross sensation intact to all lumbar and sacral dermatomes.  Reflexes: 2+ at both patellar tendons, 2+ at achilles tendons, Babinski's downgoing.  Strength at foot  Plantar-flexion: 5/5 Dorsi-flexion: 5/5 Eversion: 5/5 Inversion: 5/5  Leg strength  Quad: 5/5 Hamstring: 5/5 Hip flexor: 5/5 Hip abductors: 4/5  Gait unremarkable.  Osteopathic findings C2 flexed rotated and side bent right C6 flexed rotated and side bent left T3 extended rotated and side bent right inhaled third rib T6 extended rotated and side bent left L2 flexed rotated and side bent right Sacrum right on right    Impression and Recommendations:     This case required medical decision making of moderate complexity.      Note: This dictation was prepared with Dragon dictation along with smaller phrase technology. Any transcriptional errors that result from this process are unintentional.

## 2017-09-17 ENCOUNTER — Ambulatory Visit: Payer: 59 | Admitting: Family Medicine

## 2017-09-18 ENCOUNTER — Ambulatory Visit: Payer: BLUE CROSS/BLUE SHIELD | Admitting: Family Medicine

## 2017-09-18 ENCOUNTER — Encounter: Payer: Self-pay | Admitting: Family Medicine

## 2017-09-18 VITALS — BP 144/80 | HR 85 | Ht 72.0 in | Wt 218.0 lb

## 2017-09-18 DIAGNOSIS — M999 Biomechanical lesion, unspecified: Secondary | ICD-10-CM | POA: Diagnosis not present

## 2017-09-18 DIAGNOSIS — M76891 Other specified enthesopathies of right lower limb, excluding foot: Secondary | ICD-10-CM

## 2017-09-18 NOTE — Assessment & Plan Note (Signed)
Still tightness.  Discussed HEP, discussed OMT, Discussed OTC meds.

## 2017-09-18 NOTE — Progress Notes (Signed)
Tawana ScaleZach Brunilda Eble D.O. [facility] [address] [place] [place], [place] [address] Phone: [phone] Subjective:    I'm seeing this patient by the request  of:    CC: Back pain follow-up  BJY:[id number]:Subjective  Chase Jensen is a 51 y.o. male coming in with complaint of back pain.  Patient has been doing relatively well with increasing activity again.  Patient has noticed some soreness with shoveling snow recently.  Patient denies any numbness, any radiation of the legs, any lack of range of motion.  Patient will rates the severity of pain is 5 out of 10 still.  Has responded well to osteopathic manipulation in the past.     Past Medical History:  Diagnosis Date  . Coronary artery disease 2006    balloon angioplasty without stent placement to one-vessel done at [facility] in [place]   . Hyperlipemia   . Hypertension   . Hypothyroidism    Past Surgical History:  Procedure Laterality Date  . CARDIAC SURGERY    . CHOLECYSTECTOMY    . COLONOSCOPY WITH PROPOFOL N/A 07/29/2015   Procedure: COLONOSCOPY WITH PROPOFOL;  Surgeon: Elnita MaxwellMatthew Gordon Rein, MD;  Location[facility];  Service: Endoscopy;  Laterality: N/A;  . CORONARY ANGIOPLASTY    . HERNIA REPAIR    . NASAL SINUS SURGERY    . PILONIDAL CYST DRAINAGE    . TONSILLECTOMY     Social History   Socioeconomic History  . Marital status: Married    Spouse name: Not on file  . Number of children: Not on file  . Years of education: Not on file  . Highest education level: Not on file  Social Needs  . Financial resource strain: Not on file  . Food insecurity - worry: Not on file  . Food insecurity - inability: Not on file  . Transportation needs - medical: Not on file  . Transportation needs - non-medical: Not on file  Occupational History  . Not on file  Tobacco Use  . Smoking status: Never Smoker  . Smokeless tobacco: Never Used  Substance and Sexual Activity  . Alcohol use: Not on file  . Drug use: Not on  file  . Sexual activity: Not on file  Other Topics Concern  . Not on file  Social History Narrative  . Not on file   Allergies  Allergen Reactions  . Penicillins Rash  . Propoxyphene Rash    Darvocet N Acetaminophen   No family history on file.   Past medical history, social, surgical and family history all reviewed in electronic medical record.  No pertanent information unless stated regarding to the chief complaint.   Review of Systems:Review of systems updated and as accurate as of 09/18/17  No headache, visual changes, nausea, vomiting, diarrhea, constipation, dizziness, abdominal pain, skin rash, fevers, chills, night sweats, weight loss, swollen lymph nodes, body aches, joint swelling, muscle aches, chest pain, shortness of breath, mood changes.   Objective  There were no vitals taken for this visit. Systems examined below as of 09/18/17   General: No apparent distress alert and oriented x3 mood and affect normal, dressed appropriately.  HEENT: Pupils equal, extraocular movements intact  Respiratory: Patient's speak in full sentences and does not appear short of breath  Cardiovascular: No lower extremity edema, non tender, no erythema  Skin: Warm dry intact with no signs of infection or rash on extremities or on axial skeleton.  Abdomen: Soft nontender  Neuro: Cranial nerves II through XII are  intact, neurovascularly intact in all extremities with 2+ DTRs and 2+ pulses.  Lymph: No lymphadenopathy of posterior or anterior cervical chain or axillae bilaterally.  Gait normal with good balance and coordination.  MSK:  Non tender with full range of motion and good stability and symmetric strength and tone of shoulders, elbows, wrist, hip, knee and ankles bilaterally.  Back Exam:  Inspection: Mild loss of lordosis Motion: Flexion 45 deg, Extension 20 deg, Side Bending to 40 deg bilaterally,  Rotation to 40 deg bilaterally  SLR laying: Negative  XSLR laying: Negative  Palpable  tenderness: Tender to palpation of the paraspinal musculature.Marland Kitchen. FABER: Mild tightness bilaterally. Sensory change: Gross sensation intact to all lumbar and sacral dermatomes.  Reflexes: 2+ at both patellar tendons, 2+ at achilles tendons, Babinski's downgoing.  Strength at foot  Plantar-flexion: 5/5 Dorsi-flexion: 5/5 Eversion: 5/5 Inversion: 5/5  Leg strength  Quad: 5/5 Hamstring: 5/5 Hip flexor: 5/5 Hip abductors: 5/5  Gait unremarkable.    Osteopathic findings C2 flexed rotated and side bent right C4 flexed rotated and side bent left C7 flexed rotated and side bent left T3 extended rotated and side bent right inhaled third rib T6 extended rotated and side bent left L3 flexed rotated and side bent right Sacrum right on right  Impression and Recommendations:     This case required medical decision making of moderate complexity.      Note: This dictation was prepared with Dragon dictation along with smaller phrase technology. Any transcriptional errors that result from this process are unintentional.

## 2017-09-18 NOTE — Patient Instructions (Signed)
Good to see you  Chase Jensen is your friend.  Try  To stretch after a lot of activity  Elliptical add in the other direction sometimes.  [email or web address] for a small heel lift for a couple weeks See me again In 1-2 months

## 2017-09-18 NOTE — Assessment & Plan Note (Signed)
Decision today to treat with OMT was based on Physical Exam  After verbal consent patient was treated with HVLA, ME, FPR techniques in cervical, thoracic, rib,  lumbar and sacral areas  Patient tolerated the procedure well with improvement in symptoms  Patient given exercises, stretches and lifestyle modifications  See medications in patient instructions if given  Patient will follow up in 4-8 weeks 

## 2017-11-06 ENCOUNTER — Ambulatory Visit: Payer: BLUE CROSS/BLUE SHIELD | Admitting: Family Medicine

## 2017-11-06 NOTE — Progress Notes (Signed)
[name] [facility] [address] [place], [place] [address] Phone: [phone] Subjective:     CC: Back pain follow-up   OZH:[id number]:Subjective  Chase DeeBrian C Jensen is a 52 y.o. male coming in with complaint of back and bilateral hip pain.  Found to have vitamin D deficiency and mild iron deficiency.  Patient has done the supplementation is noticing that he is making significant strides.  He knows he can work out on a more regular schedule with endurance.  Patient is no longer have any radicular symptoms.  Overall is feeling better.      Past Medical History:  Diagnosis Date  . Coronary artery disease 2006    balloon angioplasty without stent placement to one-vessel done at [facility] in [place]   . Hyperlipemia   . Hypertension   . Hypothyroidism    Past Surgical History:  Procedure Laterality Date  . CARDIAC SURGERY    . CHOLECYSTECTOMY    . COLONOSCOPY WITH PROPOFOL N/A 07/29/2015   Procedure: COLONOSCOPY WITH PROPOFOL;  Surgeon: Elnita MaxwellMatthew Gordon Rein, MD;  Location: [facility] ENDOSCOPY;  Service: Endoscopy;  Laterality: N/A;  . CORONARY ANGIOPLASTY    . HERNIA REPAIR    . NASAL SINUS SURGERY    . PILONIDAL CYST DRAINAGE    . TONSILLECTOMY     Social History   Socioeconomic History  . Marital status: Married    Spouse name: Not on file  . Number of children: Not on file  . Years of education: Not on file  . Highest education level: Not on file  Social Needs  . Financial resource strain: Not on file  . Food insecurity - worry: Not on file  . Food insecurity - inability: Not on file  . Transportation needs - medical: Not on file  . Transportation needs - non-medical: Not on file  Occupational History  . Not on file  Tobacco Use  . Smoking status: Never Smoker  . Smokeless tobacco: Never Used  Substance and Sexual Activity  . Alcohol use: Not on file  . Drug use: Not on file  . Sexual activity: Not on file  Other Topics Concern  . Not on  file  Social History Narrative  . Not on file   Allergies  Allergen Reactions  . Penicillins Rash  . Propoxyphene Rash    Darvocet N Acetaminophen   No family history on file.  No family history of autoimmune disease   Past medical history, social, surgical and family history all reviewed in electronic medical record.  No pertanent information unless stated regarding to the chief complaint.   Review of Systems:Review of systems updated and as accurate  No headache, visual changes, nausea, vomiting, diarrhea, constipation, dizziness, abdominal pain, skin rash, fevers, chills, night sweats, weight loss, swollen lymph nodes, body aches, joint swelling,  chest pain, shortness of breath, mood changes.  Positive muscle aches  Objective  Blood pressure 110/70, pulse (!) 51, height 6' (1.829 m), weight 214 lb (97.1 kg), SpO2 97 %.    General: No apparent distress alert and oriented x3 mood and affect normal, dressed appropriately.  HEENT: Pupils equal, extraocular movements intact  Respiratory: Patient's speak in full sentences and does not appear short of breath  Cardiovascular: No lower extremity edema, non tender, no erythema  Skin: Warm dry intact with no signs of infection or rash on extremities or on axial skeleton.  Abdomen: Soft nontender  Neuro: Cranial nerves II through XII are intact, neurovascularly intact  in all extremities with 2+ DTRs and 2+ pulses.  Lymph: No lymphadenopathy of posterior or anterior cervical chain or axillae bilaterally.  Gait normal with good balance and coordination.  MSK:  Non tender with full range of motion and good stability and symmetric strength and tone of shoulders, elbows, wrist, hip, knee and ankles bilaterally.  Back Exam:  Inspection: Loss of lordosis Motion: Flexion 35 deg, Extension 25 deg, Side Bending to 25 deg bilaterally,  Rotation to 35 deg bilaterally  SLR laying: Negative  XSLR laying: Negative  Palpable tenderness: Tender to  palpation the paraspinal musculature lumbar spine right greater than left.Marland Kitchen FABER: Mild positive Pearlean Brownie on the right. Sensory change: Gross sensation intact to all lumbar and sacral dermatomes.  Reflexes: 2+ at both patellar tendons, 2+ at achilles tendons, Babinski's downgoing.  Strength at foot  Plantar-flexion: 5/5 Dorsi-flexion: 5/5 Eversion: 5/5 Inversion: 5/5  Leg strength  Quad: 5/5 Hamstring: 5/5 Hip flexor: 5/5 Hip abductors: 5/5  Gait unremarkable.  Osteopathic findings C2 flexed rotated and side bent right  T3 extended rotated and side bent right inhaled third rib T5 extended rotated and side bent left L5 flexed rotated and side bent left  Sacrum right on right    Impression and Recommendations:     This case required medical decision making of moderate complexity.      Note: This dictation was prepared with Dragon dictation along with smaller phrase technology. Any transcriptional errors that result from this process are unintentional.

## 2017-11-07 ENCOUNTER — Encounter: Payer: Self-pay | Admitting: Family Medicine

## 2017-11-07 ENCOUNTER — Ambulatory Visit: Payer: BLUE CROSS/BLUE SHIELD | Admitting: Family Medicine

## 2017-11-07 VITALS — BP 110/70 | HR 51 | Ht 72.0 in | Wt 214.0 lb

## 2017-11-07 DIAGNOSIS — M999 Biomechanical lesion, unspecified: Secondary | ICD-10-CM

## 2017-11-07 DIAGNOSIS — G5701 Lesion of sciatic nerve, right lower limb: Secondary | ICD-10-CM

## 2017-11-07 NOTE — Patient Instructions (Signed)
Good to see you  Overall doing well  Lets see what the new shoes do  See em again in 7 weeks

## 2017-11-08 NOTE — Assessment & Plan Note (Signed)
Continue mild tightness of the right side.  Discussed icing regimen, home exercise, which activities of doing which wants to avoid.  She was to increase activity slowly over the course of next several days.  Patient was able to increase to high impact exercises and feeling better.  Follow-up again in 4 weeks if not better otherwise 8 weeks.

## 2017-12-25 NOTE — Progress Notes (Signed)
[name] [address] [place], [place] [address] Phone: [phone] Subjective:    I'm seeing this patient by the request  of:    CC: Back pain follow-up  Chase Jensen  Chase Jensen is a 52 y.o. male coming in with complaint of back pain follow-up.  Patient was having more muscular tightness with hip flexor tightness.  Patient did go to physical therapy.  Was doing much better.  Did have some mild bilateral hip pain.  X-rays of the hip and back were unremarkable.  Laboratory workup showed mild iron deficiency and vitamin D deficiency.  Patient has been supplementing.  Patient states that his right hip has been bothering him all week. On Tuesday, even step he took felt like a stabbing pain. The pain has diminished throughout the week.     Past Medical History:  Diagnosis Date  . Coronary artery disease 2006    balloon angioplasty without stent placement to one-vessel done at [facility] in [place]   . Hyperlipemia   . Hypertension   . Hypothyroidism    Past Surgical History:  Procedure Laterality Date  . CARDIAC SURGERY    . CHOLECYSTECTOMY    . COLONOSCOPY WITH PROPOFOL N/A 07/29/2015   Procedure: COLONOSCOPY WITH PROPOFOL;  Surgeon: Elnita Maxwell, MD;  Location: [facility] ENDOSCOPY;  Service: Endoscopy;  Laterality: N/A;  . CORONARY ANGIOPLASTY    . HERNIA REPAIR    . NASAL SINUS SURGERY    . PILONIDAL CYST DRAINAGE    . TONSILLECTOMY     Social History   Socioeconomic History  . Marital status: Married    Spouse name: Not on file  . Number of children: Not on file  . Years of education: Not on file  . Highest education level: Not on file  Occupational History  . Not on file  Social Needs  . Financial resource strain: Not on file  . Food insecurity:    Worry: Not on file    Inability: Not on file  . Transportation needs:    Medical: Not on file    Non-medical: Not on file  Tobacco Use  . Smoking status: Never  Smoker  . Smokeless tobacco: Never Used  Substance and Sexual Activity  . Alcohol use: Not on file  . Drug use: Not on file  . Sexual activity: Not on file  Lifestyle  . Physical activity:    Days per week: Not on file    Minutes per session: Not on file  . Stress: Not on file  Relationships  . Social connections:    Talks on phone: Not on file    Gets together: Not on file    Attends religious service: Not on file    Active member of club or organization: Not on file    Attends meetings of clubs or organizations: Not on file    Relationship status: Not on file  Other Topics Concern  . Not on file  Social History Narrative  . Not on file   Allergies  Allergen Reactions  . Penicillins Rash  . Propoxyphene Rash    Darvocet N Acetaminophen   No family history on file.   Past medical history, social, surgical and family history all reviewed in electronic medical record.  No pertanent information unless stated regarding to the chief complaint.   Review of Systems:Review of systems updated and as accurate as of 12/26/17  No headache, visual changes, nausea, vomiting, diarrhea, constipation, dizziness,  abdominal pain, skin rash, fevers, chills, night sweats, weight loss, swollen lymph nodes, body aches, joint swelling, muscle aches, chest pain, shortness of breath, mood changes.   Objective  Blood pressure 110/72, pulse (!) 55, height 6' (1.829 m), weight 218 lb (98.9 kg), SpO2 98 %. Systems examined below as of 12/26/17   General: No apparent distress alert and oriented x3 mood and affect normal, dressed appropriately.  HEENT: Pupils equal, extraocular movements intact  Respiratory: Patient's speak in full sentences and does not appear short of breath  Cardiovascular: No lower extremity edema, non tender, no erythema  Skin: Warm dry intact with no signs of infection or rash on extremities or on axial skeleton.  Abdomen: Soft nontender  Neuro: Cranial nerves II through XII  are intact, neurovascularly intact in all extremities with 2+ DTRs and 2+ pulses.  Lymph: No lymphadenopathy of posterior or anterior cervical chain or axillae bilaterally.  Gait normal with good balance and coordination.  MSK:  Non tender with full range of motion and good stability and symmetric strength and tone of shoulders, elbows, wrist, hip, knee and ankles bilaterally.  Back Exam:  Inspection: Unremarkable  Motion: Flexion 45 deg, Extension 25 deg, Side Bending to 35 deg bilaterally,  Rotation to 45 deg bilaterally  SLR laying: Negative  XSLR laying: Negative  Palpable tenderness: Tender to palpation in the paraspinal musculature of the lumbar spine. FABER: Positive fiber. Sensory change: Gross sensation intact to all lumbar and sacral dermatomes.  Reflexes: 2+ at both patellar tendons, 2+ at achilles tendons, Babinski's downgoing.  Strength at foot  Plantar-flexion: 5/5 Dorsi-flexion: 5/5 Eversion: 5/5 Inversion: 5/5  Leg strength  Quad: 5/5 Hamstring: 5/5 Hip flexor: 5/5 Hip abductors: 5/5  Gait unremarkable.  Osteopathic findings C2 flexed rotated and side bent right C4 flexed rotated and side bent left C7 flexed rotated and side bent left T3 extended rotated and side bent right inhaled third rib T9 extended rotated and side bent left L2 flexed rotated and side bent right Sacrum right on right     Impression and Recommendations:     This case required medical decision making of moderate complexity.      Note: This dictation was prepared with Dragon dictation along with smaller phrase technology. Any transcriptional errors that result from this process are unintentional.

## 2017-12-26 ENCOUNTER — Ambulatory Visit: Payer: BLUE CROSS/BLUE SHIELD | Admitting: Family Medicine

## 2017-12-26 ENCOUNTER — Encounter: Payer: Self-pay | Admitting: Family Medicine

## 2017-12-26 VITALS — BP 110/72 | HR 55 | Ht 72.0 in | Wt 218.0 lb

## 2017-12-26 DIAGNOSIS — M999 Biomechanical lesion, unspecified: Secondary | ICD-10-CM

## 2017-12-26 DIAGNOSIS — M76891 Other specified enthesopathies of right lower limb, excluding foot: Secondary | ICD-10-CM

## 2017-12-26 NOTE — Patient Instructions (Signed)
Great to see you  Lets watch the hip and write me if it happens again  Keep working on the stretches and stay active See me again in 6 weeks

## 2017-12-26 NOTE — Assessment & Plan Note (Signed)
Decision today to treat with OMT was based on Physical Exam  After verbal consent patient was treated with HVLA, ME, FPR techniques in cervical, thoracic, lumbar and sacral areas  Patient tolerated the procedure well with improvement in symptoms  Patient given exercises, stretches and lifestyle modifications  See medications in patient instructions if given  Patient will follow up in 4-8 weeks 

## 2017-12-26 NOTE — Assessment & Plan Note (Signed)
Continues to have tightness on that side.  Discussed icing regimen and home exercises.  Discussed which activities to do which wants to avoid.  Follow-up again in 4-8 weeks

## 2017-12-27 MED ORDER — TIZANIDINE HCL 4 MG PO TABS: 4.0000 mg | ORAL_TABLET | Freq: Every evening | ORAL | 2 refills | Status: AC

## 2018-02-01 ENCOUNTER — Other Ambulatory Visit: Payer: Self-pay | Admitting: Family Medicine

## 2018-02-03 NOTE — Telephone Encounter (Signed)
Refill done.  

## 2018-02-06 ENCOUNTER — Ambulatory Visit: Payer: BLUE CROSS/BLUE SHIELD | Admitting: Family Medicine

## 2018-02-06 ENCOUNTER — Encounter: Payer: Self-pay | Admitting: Family Medicine

## 2018-02-06 VITALS — BP 104/68 | HR 59 | Ht 72.0 in | Wt 216.0 lb

## 2018-02-06 DIAGNOSIS — M76891 Other specified enthesopathies of right lower limb, excluding foot: Secondary | ICD-10-CM | POA: Diagnosis not present

## 2018-02-06 DIAGNOSIS — M999 Biomechanical lesion, unspecified: Secondary | ICD-10-CM

## 2018-02-06 DIAGNOSIS — M25551 Pain in right hip: Secondary | ICD-10-CM | POA: Diagnosis not present

## 2018-02-06 NOTE — Assessment & Plan Note (Signed)
Decision today to treat with OMT was based on Physical Exam  After verbal consent patient was treated with HVLA, ME, FPR techniques in cervical, thoracic, lumbar and sacral areas  Patient tolerated the procedure well with improvement in symptoms  Patient given exercises, stretches and lifestyle modifications  See medications in patient instructions if given  Patient will follow up in 4 weeks 

## 2018-02-06 NOTE — Patient Instructions (Signed)
Good to see you  We will get MR-arthrogram of the hip  Ice is yoru friend Up  to you on the gabapentin  See me again in 4 weeks

## 2018-02-06 NOTE — Assessment & Plan Note (Signed)
Likely more of a hip flexor tendinitis but patient is worsening at this time.  Has failed all conservative therapy.  Concern for potential even small hip psoas abscess versus possible labral pathology at this point with a positive grind test.  I do feel that an MR arthrogram of the hip would be beneficial.  Patient could be a surgical candidate if necessary.  Patient has not made improvement with all other conservative therapy.  Attempted osteopathic manipulation again at this time.  Discussed with different medications.  Patient will follow-up with me again in 4 weeks

## 2018-02-06 NOTE — Progress Notes (Signed)
Chase Jensen [address] [place], [place] [address] Phone: [phone] Subjective:     CC: Back pain follow-up  NGE:[id number]  Chase Jensen is a 52 y.o. male coming in with complaint of back pain. He said that he has not been doing well since last visit. He continues to have pinching in the front of the right hip. He has been trying to evaluate his daily activities and cannot find one that seems to be the cause of his pain.  Patient will states that it is unfortunately much more localized to the groin area.  States that when it catches and it is severe.  Patient has been doing all conservative therapy and has been to formal physical therapy with no significant improvement.  Patient rates the severity of pain is 9 out of 10.  Patient feels like it is affecting daily activities and even waking him up at night.  Onset-  Location Duration-  Character- Aggravating factors- Reliving factors-  Therapies tried-  Severity-     Past Medical History:  Diagnosis Date  . Coronary artery disease 2006    balloon angioplasty without stent placement to one-vessel done at [facility] in [place]   . Hyperlipemia   . Hypertension   . Hypothyroidism    Past Surgical History:  Procedure Laterality Date  . CARDIAC SURGERY    . CHOLECYSTECTOMY    . COLONOSCOPY WITH PROPOFOL N/A 07/29/2015   Procedure: COLONOSCOPY WITH PROPOFOL;  Surgeon: Elnita Maxwell, MD;  Location: [facility] ENDOSCOPY;  Service: Endoscopy;  Laterality: N/A;  . CORONARY ANGIOPLASTY    . HERNIA REPAIR    . NASAL SINUS SURGERY    . PILONIDAL CYST DRAINAGE    . TONSILLECTOMY     Social History   Socioeconomic History  . Marital status: Married    Spouse name: Not on file  . Number of children: Not on file  . Years of education: Not on file  . Highest education level: Not on file  Occupational History  . Not on file  Social Needs  . Financial resource strain: Not on file  .  Food insecurity:    Worry: Not on file    Inability: Not on file  . Transportation needs:    Medical: Not on file    Non-medical: Not on file  Tobacco Use  . Smoking status: Never Smoker  . Smokeless tobacco: Never Used  Substance and Sexual Activity  . Alcohol use: Not on file  . Drug use: Not on file  . Sexual activity: Not on file  Lifestyle  . Physical activity:    Days per week: Not on file    Minutes per session: Not on file  . Stress: Not on file  Relationships  . Social connections:    Talks on phone: Not on file    Gets together: Not on file    Attends religious service: Not on file    Active member of club or organization: Not on file    Attends meetings of clubs or organizations: Not on file    Relationship status: Not on file  Other Topics Concern  . Not on file  Social History Narrative  . Not on file   Allergies  Allergen Reactions  . Penicillins Rash  . Propoxyphene Rash    Darvocet N Acetaminophen   No family history on file.  No family history of autoimmune   Past medical history, social, surgical and family history  all reviewed in electronic medical record.  No pertanent information unless stated regarding to the chief complaint.   Review of Systems:Review of systems updated and as accurate as of 02/06/18  No headache, visual changes, nausea, vomiting, diarrhea, constipation, dizziness, abdominal pain, skin rash, fevers, chills, night sweats, weight loss, swollen lymph nodes, body aches, joint swelling,  chest pain, shortness of breath, mood changes.  Positive muscle aches  Objective  Blood pressure 104/68, pulse (!) 59, height 6' (1.829 m), weight 216 lb (98 kg), SpO2 98 %. Systems examined below as of 02/06/18   General: No apparent distress alert and oriented x3 mood and affect normal, dressed appropriately.  HEENT: Pupils equal, extraocular movements intact  Respiratory: Patient's speak in full sentences and does not appear short of breath   Cardiovascular: No lower extremity edema, non tender, no erythema  Skin: Warm dry intact with no signs of infection or rash on extremities or on axial skeleton.  Abdomen: Soft nontender  Neuro: Cranial nerves II through XII are intact, neurovascularly intact in all extremities with 2+ DTRs and 2+ pulses.  Lymph: No lymphadenopathy of posterior or anterior cervical chain or axillae bilaterally.  Gait normal with good balance and coordination.  MSK:  Non tender with full range of motion and good stability and symmetric strength and tone of shoulders, elbows, wrist,  knee and ankles bilaterally.  Hip: Right ROM IR: 20 Deg, ER: 35 Deg, Flexion: 100 Deg, Extension: 80 Deg, Abduction: 45 Deg, Adduction: 45 Deg Strength IR: 5/5, ER: 5/5, Flexion: 5/5, Extension: 5/5, Abduction: 4/5 but symmetric adduction: 5/5 Pelvic alignment unremarkable to inspection and palpation. Standing hip rotation and gait without trendelenburg sign / unsteadiness. Greater trochanter without tenderness to palpation. No tenderness over piriformis and greater trochanter. Severe pain with internal rotation and positive grind test No SI joint tenderness and normal minimal SI movement.  Osteopathic findings  C6 flexed rotated and side bent left T3 extended rotated and side bent right inhaled third rib T5 extended rotated and side bent left L4 flexed rotated and side bent left  Sacrum right on right     Impression and Recommendations:     This case required medical decision making of moderate complexity.      Note: This dictation was prepared with Dragon dictation along with smaller phrase technology. Any transcriptional errors that result from this process are unintentional.

## 2018-02-28 ENCOUNTER — Encounter: Payer: Self-pay | Admitting: Family Medicine

## 2018-02-28 ENCOUNTER — Ambulatory Visit
Admission: RE | Admit: 2018-02-28 | Discharge: 2018-02-28 | Disposition: A | Discharge status: Home / Self Care | Payer: BLUE CROSS/BLUE SHIELD | Source: Ambulatory Visit | Attending: Family Medicine | Admitting: Family Medicine

## 2018-02-28 DIAGNOSIS — M25551 Pain in right hip: Secondary | ICD-10-CM

## 2018-02-28 MED ORDER — IOPAMIDOL (ISOVUE-M 200) INJECTION 41%
12.0000 mL | Freq: Once | INTRAMUSCULAR | Status: AC
  Administered 2018-02-28: 12 mL via INTRA_ARTICULAR

## 2018-03-05 NOTE — Progress Notes (Deleted)
[name] [address] [place], [place] [address] Phone: [phone] Subjective:    I'm seeing this patient by the request  of:    CC:   BJY:[id number]  Chase Jensen is a 52 y.o. male coming in with complaint of ***  Onset-  Location Duration-  Character- Aggravating factors- Reliving factors-  Therapies tried-  Severity-     Past Medical History:  Diagnosis Date  . Coronary artery disease 2006    balloon angioplasty without stent placement to one-vessel done at [facility] in [place]   . Hyperlipemia   . Hypertension   . Hypothyroidism    Past Surgical History:  Procedure Laterality Date  . CARDIAC SURGERY    . CHOLECYSTECTOMY    . COLONOSCOPY WITH PROPOFOL N/A 07/29/2015   Procedure: COLONOSCOPY WITH PROPOFOL;  Surgeon: Elnita Maxwell, MD;  Location: [facility] ENDOSCOPY;  Service: Endoscopy;  Laterality: N/A;  . CORONARY ANGIOPLASTY    . HERNIA REPAIR    . NASAL SINUS SURGERY    . PILONIDAL CYST DRAINAGE    . TONSILLECTOMY     Social History   Socioeconomic History  . Marital status: Married    Spouse name: Not on file  . Number of children: Not on file  . Years of education: Not on file  . Highest education level: Not on file  Occupational History  . Not on file  Social Needs  . Financial resource strain: Not on file  . Food insecurity:    Worry: Not on file    Inability: Not on file  . Transportation needs:    Medical: Not on file    Non-medical: Not on file  Tobacco Use  . Smoking status: Never Smoker  . Smokeless tobacco: Never Used  Substance and Sexual Activity  . Alcohol use: Not on file  . Drug use: Not on file  . Sexual activity: Not on file  Lifestyle  . Physical activity:    Days per week: Not on file    Minutes per session: Not on file  . Stress: Not on file  Relationships  . Social connections:    Talks on phone: Not on file    Gets together: Not on file    Attends religious  service: Not on file    Active member of club or organization: Not on file    Attends meetings of clubs or organizations: Not on file    Relationship status: Not on file  Other Topics Concern  . Not on file  Social History Narrative  . Not on file   Allergies  Allergen Reactions  . Penicillins Rash  . Propoxyphene Rash    Darvocet N Acetaminophen   No family history on file.   Past medical history, social, surgical and family history all reviewed in electronic medical record.  No pertanent information unless stated regarding to the chief complaint.   Review of Systems:Review of systems updated and as accurate as of 03/05/18  No headache, visual changes, nausea, vomiting, diarrhea, constipation, dizziness, abdominal pain, skin rash, fevers, chills, night sweats, weight loss, swollen lymph nodes, body aches, joint swelling, muscle aches, chest pain, shortness of breath, mood changes.   Objective  There were no vitals taken for this visit. Systems examined below as of 03/05/18   General: No apparent distress alert and oriented x3 mood and affect normal, dressed appropriately.  HEENT: Pupils equal, extraocular movements intact  Respiratory: Patient's speak in full sentences and  does not appear short of breath  Cardiovascular: No lower extremity edema, non tender, no erythema  Skin: Warm dry intact with no signs of infection or rash on extremities or on axial skeleton.  Abdomen: Soft nontender  Neuro: Cranial nerves II through XII are intact, neurovascularly intact in all extremities with 2+ DTRs and 2+ pulses.  Lymph: No lymphadenopathy of posterior or anterior cervical chain or axillae bilaterally.  Gait normal with good balance and coordination.  MSK:  Non tender with full range of motion and good stability and symmetric strength and tone of shoulders, elbows, wrist, hip, knee and ankles bilaterally.     Impression and Recommendations:     This case required medical decision  making of moderate complexity.      Note: This dictation was prepared with Dragon dictation along with smaller phrase technology. Any transcriptional errors that result from this process are unintentional.

## 2018-03-06 ENCOUNTER — Encounter: Payer: Self-pay | Admitting: Family Medicine

## 2018-03-06 ENCOUNTER — Ambulatory Visit: Payer: BLUE CROSS/BLUE SHIELD | Admitting: Family Medicine

## 2018-03-06 NOTE — Progress Notes (Signed)
[name] [address] [place], [place] [address] Phone: [phone] Subjective:     CC: Right hip pain  BJY:NWGNFAOZHY  RUEBEN Jensen is a 52 y.o. male coming in with complaint of right hip pain. He does feel better but states that he has not been doing any physical activity. Pain is still present and is a 1/10 today.  Patient was seen previously and was having worsening pain and sent for an MR angiogram of the hip. Angiogram of the hip show the patient did have focal arthritic changes in multiple areas of the joint.  No labral tear noted.      Past Medical History:  Diagnosis Date  . Coronary artery disease 2006    balloon angioplasty without stent placement to one-vessel done at [facility] in [place]   . Hyperlipemia   . Hypertension   . Hypothyroidism    Past Surgical History:  Procedure Laterality Date  . CARDIAC SURGERY    . CHOLECYSTECTOMY    . COLONOSCOPY WITH PROPOFOL N/A 07/29/2015   Procedure: COLONOSCOPY WITH PROPOFOL;  Surgeon: Elnita Maxwell, MD;  Location: [facility] ENDOSCOPY;  Service: Endoscopy;  Laterality: N/A;  . CORONARY ANGIOPLASTY    . HERNIA REPAIR    . NASAL SINUS SURGERY    . PILONIDAL CYST DRAINAGE    . TONSILLECTOMY     Social History   Socioeconomic History  . Marital status: Married    Spouse name: Not on file  . Number of children: Not on file  . Years of education: Not on file  . Highest education level: Not on file  Occupational History  . Not on file  Social Needs  . Financial resource strain: Not on file  . Food insecurity:    Worry: Not on file    Inability: Not on file  . Transportation needs:    Medical: Not on file    Non-medical: Not on file  Tobacco Use  . Smoking status: Never Smoker  . Smokeless tobacco: Never Used  Substance and Sexual Activity  . Alcohol use: Not on file  . Drug use: Not on file  . Sexual activity: Not on file  Lifestyle  . Physical activity:   Days per week: Not on file    Minutes per session: Not on file  . Stress: Not on file  Relationships  . Social connections:    Talks on phone: Not on file    Gets together: Not on file    Attends religious service: Not on file    Active member of club or organization: Not on file    Attends meetings of clubs or organizations: Not on file    Relationship status: Not on file  Other Topics Concern  . Not on file  Social History Narrative  . Not on file   Allergies  Allergen Reactions  . Penicillins Rash  . Propoxyphene Rash    Darvocet N Acetaminophen   No family history on file.   Past medical history, social, surgical and family history all reviewed in electronic medical record.  No pertanent information unless stated regarding to the chief complaint.   Review of Systems:Review of systems updated and as accurate as of 03/06/18  No headache, visual changes, nausea, vomiting, diarrhea, constipation, dizziness, abdominal pain, skin rash, fevers, chills, night sweats, weight loss, swollen lymph nodes, body aches, joint swelling, muscle aches, chest pain, shortness of breath, mood changes.   Objective  There were no  vitals taken for this visit. Systems examined below as of 03/06/18   General: No apparent distress alert and oriented x3 mood and affect normal, dressed appropriately.  HEENT: Pupils equal, extraocular movements intact  Respiratory: Patient's speak in full sentences and does not appear short of breath  Cardiovascular: No lower extremity edema, non tender, no erythema  Skin: Warm dry intact with no signs of infection or rash on extremities or on axial skeleton.  Abdomen: Soft nontender  Neuro: Cranial nerves II through XII are intact, neurovascularly intact in all extremities with 2+ DTRs and 2+ pulses.  Lymph: No lymphadenopathy of posterior or anterior cervical chain or axillae bilaterally.  Gait normal with good balance and coordination.  MSK:  Non tender with full  range of motion and good stability and symmetric strength and tone of shoulders, elbows, wrist, , knee and ankles bilaterally.  Hip: Right ROM IR: 25 Deg, ER: 45 Deg, Flexion: 120 Deg, Extension: 100 Deg, Abduction: 45 Deg, Adduction: 45 Deg Strength IR: 5/5, ER: 5/5, Flexion: 5/5, Extension: 5/5, Abduction: 4/5 but symmetric adduction: 5/5 Pelvic alignment unremarkable to inspection and palpation. Standing hip rotation and gait without trendelenburg sign / unsteadiness. Positive pain with internal rotation Moderate tenderness of the right sacroiliac joint Contralateral hip unremarkable  Osteopathic findings C6 flexed rotated and side bent left T9 extended rotated and side bent left L2 flexed rotated and side bent right Sacrum right on right     Impression and Recommendations:     This case required medical decision making of moderate complexity.      Note: This dictation was prepared with Dragon dictation along with smaller phrase technology. Any transcriptional errors that result from this process are unintentional.

## 2018-03-10 ENCOUNTER — Ambulatory Visit: Payer: BLUE CROSS/BLUE SHIELD | Admitting: Family Medicine

## 2018-03-10 ENCOUNTER — Encounter: Payer: Self-pay | Admitting: Family Medicine

## 2018-03-10 VITALS — HR 71 | Ht 72.0 in | Wt 214.0 lb

## 2018-03-10 DIAGNOSIS — M999 Biomechanical lesion, unspecified: Secondary | ICD-10-CM

## 2018-03-10 DIAGNOSIS — M1611 Unilateral primary osteoarthritis, right hip: Secondary | ICD-10-CM | POA: Diagnosis not present

## 2018-03-10 NOTE — Assessment & Plan Note (Signed)
Decision today to treat with OMT was based on Physical Exam  After verbal consent patient was treated with HVLA, ME, FPR techniques in cervical, thoracic, lumbar and sacral areas  Patient tolerated the procedure well with improvement in symptoms  Patient given exercises, stretches and lifestyle modifications  See medications in patient instructions if given  Patient will follow up in 4 weeks 

## 2018-03-10 NOTE — Assessment & Plan Note (Signed)
Arthritis of the right hip.  Seem to be in some focal areas.  Patient wants to try conservative therapy.  Discussed the possibility of injections if necessary.  Continue with osteopathic manipulation.  Discussed icing regimen.  Discussed core strength and stability.  Follow-up again in 4 weeks

## 2018-03-10 NOTE — Patient Instructions (Signed)
Good to see you  Gustavus Bryantce is your friend.  Stay active OK to run but need to consider other lower impact exercises  I think manipulation helps and we should do it every 2-3 months Continue the iron  Gabapentin only as needed When in pain meloxicam daily for 5-10 days then as needed See me again in 6-8 weeks

## 2018-03-13 ENCOUNTER — Encounter: Payer: Self-pay | Admitting: Family Medicine

## 2018-03-14 MED ORDER — GABAPENTIN 100 MG PO CAPS: 200.0000 mg | ORAL_CAPSULE | Freq: Every day | ORAL | 3 refills | Status: DC

## 2018-03-14 MED ORDER — TIZANIDINE HCL 4 MG PO TABS: 4.0000 mg | ORAL_TABLET | Freq: Every evening | ORAL | 2 refills | Status: AC

## 2018-04-25 NOTE — Progress Notes (Signed)
[facility] D[facility] [address] [place], [place] [address] Phone: [phone] Subjective:     CC: Back pain  BJY:NWGNFAOZHYHPI:Subjective  Chase Jensen is a 52 y.o. male coming in with complaint of back pain. He is here for OMT. No changes since last visit. He also mentions that he has continued left hip pain. Has not been active since last visit but states that he would like to try to get into the pool if possible.  Patient did find to have arthritis of the right hip.  Still having difficulty with it and still having pain.  Doing vitamin D.  Still not working out as much as he would like to.       Past Medical History:  Diagnosis Date  . Coronary artery disease 2006    balloon angioplasty without stent placement to one-vessel done at [facility] [place]   . Hyperlipemia   . Hypertension   . Hypothyroidism    Past Surgical History:  Procedure Laterality Date  . CARDIAC SURGERY    . CHOLECYSTECTOMY    . COLONOSCOPY WITH PROPOFOL N/A 07/29/2015   Procedure: COLONOSCOPY WITH PROPOFOL;  Surgeon: Elnita MaxwellMatthew Gordon Rein, MD;  Location[facility] ENDOSCOPY;  Service: Endoscopy;  Laterality: N/A;  . CORONARY ANGIOPLASTY    . HERNIA REPAIR    . NASAL SINUS SURGERY    . PILONIDAL CYST DRAINAGE    . TONSILLECTOMY     Social History   Socioeconomic History  . Marital status: Married    Spouse name: Not on file  . Number of children: Not on file  . Years of education: Not on file  . Highest education level: Not on file  Occupational History  . Not on file  Social Needs  . Financial resource strain: Not on file  . Food insecurity:    Worry: Not on file    Inability: Not on file  . Transportation needs:    Medical: Not on file    Non-medical: Not on file  Tobacco Use  . Smoking status: Never Smoker  . Smokeless tobacco: Never Used  Substance and Sexual Activity  . Alcohol use: Not on file  . Drug use: Not on file  . Sexual activity: Not on file    Lifestyle  . Physical activity:    Days per week: Not on file    Minutes per session: Not on file  . Stress: Not on file  Relationships  . Social connections:    Talks on phone: Not on file    Gets together: Not on file    Attends religious service: Not on file    Active member of club or organization: Not on file    Attends meetings of clubs or organizations: Not on file    Relationship status: Not on file  Other Topics Concern  . Not on file  Social History Narrative  . Not on file   Allergies  Allergen Reactions  . Penicillins Rash  . Propoxyphene Rash    Darvocet N Acetaminophen   No family history on file.   Past medical history, social, surgical and family history all reviewed in electronic medical record.  No pertanent information unless stated regarding to the chief complaint.   Review of Systems:Review of systems updated and as accurate as of 04/28/18  No headache, visual changes, nausea, vomiting, diarrhea, constipation, dizziness, abdominal pain, skin rash, fevers, chills, night sweats, weight loss, swollen lymph nodes, body aches, joint swelling, muscle  aches, chest pain, shortness of breath, mood changes.   Objective  Blood pressure 118/76, pulse 61, height 6' (1.829 m), weight 213 lb (96.6 kg), SpO2 97 %. Systems examined below as of 04/28/18   General: No apparent distress alert and oriented x3 mood and affect normal, dressed appropriately.  HEENT: Pupils equal, extraocular movements intact  Respiratory: Patient's speak in full sentences and does not appear short of breath  Cardiovascular: No lower extremity edema, non tender, no erythema  Skin: Warm dry intact with no signs of infection or rash on extremities or on axial skeleton.  Abdomen: Soft nontender  Neuro: Cranial nerves II through XII are intact, neurovascularly intact in all extremities with 2+ DTRs and 2+ pulses.  Lymph: No lymphadenopathy of posterior or anterior cervical chain or axillae  bilaterally.  Gait normal with good balance and coordination.  MSK:  Non tender with full range of motion and good stability and symmetric strength and tone of shoulders, elbows, wrist,  knee and ankles bilaterally.  Back Exam:  Inspection: Mild loss of lordosis Motion: Flexion 45 deg, Extension 25 deg, Side Bending to 35 deg bilaterally,  Rotation to 35 deg bilaterally  SLR laying: Negative  XSLR laying: Negative  Palpable tenderness: Tender to palpation the paraspinal musculature lumbar spine left greater than right. FABER: Positive Faber on the left and mildly on the right. Sensory change: Gross sensation intact to all lumbar and sacral dermatomes.  Reflexes: 2+ at both patellar tendons, 2+ at achilles tendons, Babinski's downgoing.  Strength at foot  Plantar-flexion: 5/5 Dorsi-flexion: 5/5 Eversion: 5/5 Inversion: 5/5  Leg strength  Limitation in internal range of motion of the right hip  Osteopathic findings C6 flexed rotated and side bent left T5 extended rotated and side bent right inhaled rib T9 extended rotated and side bent left L2 flexed rotated and side bent right Sacrum right on right     Impression and Recommendations:     This case required medical decision making of moderate complexity.      Note: This dictation was prepared with Dragon dictation along with smaller phrase technology. Any transcriptional errors that result from this process are unintentional.

## 2018-04-28 ENCOUNTER — Encounter: Payer: Self-pay | Admitting: Family Medicine

## 2018-04-28 ENCOUNTER — Ambulatory Visit: Payer: BLUE CROSS/BLUE SHIELD | Admitting: Family Medicine

## 2018-04-28 VITALS — BP 118/76 | HR 61 | Ht 72.0 in | Wt 213.0 lb

## 2018-04-28 DIAGNOSIS — M1611 Unilateral primary osteoarthritis, right hip: Secondary | ICD-10-CM

## 2018-04-28 DIAGNOSIS — M999 Biomechanical lesion, unspecified: Secondary | ICD-10-CM | POA: Insufficient documentation

## 2018-04-28 NOTE — Assessment & Plan Note (Signed)
Decision today to treat with OMT was based on Physical Exam  After verbal consent patient was treated with HVLA, ME, FPR techniques in cervical, thoracic, rib,  lumbar and sacral areas  Patient tolerated the procedure well with improvement in symptoms  Patient given exercises, stretches and lifestyle modifications  See medications in patient instructions if given  Patient will follow up in 4-8 weeks 

## 2018-04-28 NOTE — Patient Instructions (Signed)
Good to see you  Read about PRP and we can try it  Otherwise steroid is another possibility  Stay active but more biking, planks and swimming See me again in 3-4 weeks and call us or write us if you want to do the PRP

## 2018-04-28 NOTE — Assessment & Plan Note (Signed)
I believe the patient's discomfort and pain is secondary to more of the right hip arthritis.  We discussed icing regimen and home exercises.  Discussed which activities to do which wants to avoid.  Patient does respond well to manipulation.  Discussed different injection options for the hip.  Follow-up again in 4 to 8 weeks

## 2018-05-22 NOTE — Progress Notes (Signed)
[name] [facility] [address] [place], [place] [address] Phone: [phone] Subjective:      CC: Hip pain  MWU:[id number]:Subjective  Chase DeeBrian C Jensen is a 52 y.o. male coming in with complaint of hip pain. He states that he was doing great but has had an increase in pain recently. He cannot attribute the increase to any particular event or activity. Has not changed footwear. Has had relief with OMT previously.  Patient says has not been running and has not been working out regularly just secondary to work and stress at the moment.      Past Medical History:  Diagnosis Date  . Coronary artery disease 2006    balloon angioplasty without stent placement to one-vessel done at [facility] in [place]   . Hyperlipemia   . Hypertension   . Hypothyroidism    Past Surgical History:  Procedure Laterality Date  . CARDIAC SURGERY    . CHOLECYSTECTOMY    . COLONOSCOPY WITH PROPOFOL N/A 07/29/2015   Procedure: COLONOSCOPY WITH PROPOFOL;  Surgeon: Elnita MaxwellMatthew Gordon Rein, MD;  Location[facility] ENDOSCOPY;  Service: Endoscopy;  Laterality: N/A;  . CORONARY ANGIOPLASTY    . HERNIA REPAIR    . NASAL SINUS SURGERY    . PILONIDAL CYST DRAINAGE    . TONSILLECTOMY     Social History   Socioeconomic History  . Marital status: Married    Spouse name: Not on file  . Number of children: Not on file  . Years of education: Not on file  . Highest education level: Not on file  Occupational History  . Not on file  Social Needs  . Financial resource strain: Not on file  . Food insecurity:    Worry: Not on file    Inability: Not on file  . Transportation needs:    Medical: Not on file    Non-medical: Not on file  Tobacco Use  . Smoking status: Never Smoker  . Smokeless tobacco: Never Used  Substance and Sexual Activity  . Alcohol use: Not on file  . Drug use: Not on file  . Sexual activity: Not on file  Lifestyle  . Physical activity:    Days per week: Not on file   Minutes per session: Not on file  . Stress: Not on file  Relationships  . Social connections:    Talks on phone: Not on file    Gets together: Not on file    Attends religious service: Not on file    Active member of club or organization: Not on file    Attends meetings of clubs or organizations: Not on file    Relationship status: Not on file  Other Topics Concern  . Not on file  Social History Narrative  . Not on file   Allergies  Allergen Reactions  . Penicillins Rash  . Propoxyphene Rash    Darvocet N Acetaminophen   No family history on file.   Past medical history, social, surgical and family history all reviewed in electronic medical record.  No pertanent information unless stated regarding to the chief complaint.   Review of Systems:Review of systems updated and as accurate as of 05/26/18  No headache, visual changes, nausea, vomiting, diarrhea, constipation, dizziness, abdominal pain, skin rash, fevers, chills, night sweats, weight loss, swollen lymph nodes, body aches, joint swelling, chest pain, shortness of breath, mood changes.  Positive muscle aches  Objective  Blood pressure 112/74, pulse 75, height 6' (1.829 m), weight  215 lb (97.5 kg), SpO2 97 %. Systems examined below as of 05/26/18   General: No apparent distress alert and oriented x3 mood and affect normal, dressed appropriately.  HEENT: Pupils equal, extraocular movements intact  Respiratory: Patient's speak in full sentences and does not appear short of breath  Cardiovascular: No lower extremity edema, non tender, no erythema  Skin: Warm dry intact with no signs of infection or rash on extremities or on axial skeleton.  Abdomen: Soft nontender  Neuro: Cranial nerves II through XII are intact, neurovascularly intact in all extremities with 2+ DTRs and 2+ pulses.  Lymph: No lymphadenopathy of posterior or anterior cervical chain or axillae bilaterally.  Gait normal with good balance and coordination.    MSK:  Non tender with full range of motion and good stability and symmetric strength and tone of shoulders, elbows, wrist,  knee and ankles bilaterally.  Right hip show some decreased range of motion in all planes especially with internal range of motion.  Negative straight leg test.  Mild discomfort over the sacroiliac joint.  Osteopathic findings C2 flexed rotated and side bent right T3 extended rotated and side bent right inhaled third rib L4 flexed rotated and side bent left  Sacrum right on right      Impression and Recommendations:     This case required medical decision making of moderate complexity.      Note: This dictation was prepared with Dragon dictation along with smaller phrase technology. Any transcriptional errors that result from this process are unintentional.

## 2018-05-26 ENCOUNTER — Ambulatory Visit: Payer: BLUE CROSS/BLUE SHIELD | Admitting: Family Medicine

## 2018-05-26 ENCOUNTER — Encounter: Payer: Self-pay | Admitting: Family Medicine

## 2018-05-26 VITALS — BP 112/74 | HR 75 | Ht 72.0 in | Wt 215.0 lb

## 2018-05-26 DIAGNOSIS — M1611 Unilateral primary osteoarthritis, right hip: Secondary | ICD-10-CM

## 2018-05-26 DIAGNOSIS — M999 Biomechanical lesion, unspecified: Secondary | ICD-10-CM | POA: Diagnosis not present

## 2018-05-26 NOTE — Assessment & Plan Note (Signed)
Continues to give him some difficulty.  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  We discussed about the running and surely starting a very slow running progression.  Discussed icing regimen and home exercise.  Discussed which activities of doing which wants to avoid.  Patient will increase activity as tolerated.  Follow-up with me again in 4 to 6 weeks.  Responding well to the osteopathic manipulation

## 2018-05-26 NOTE — Assessment & Plan Note (Signed)
Decision today to treat with OMT was based on Physical Exam  After verbal consent patient was treated with HVLA, ME, FPR techniques in cervical, thoracic, rib,  lumbar and sacral areas  Patient tolerated the procedure well with improvement in symptoms  Patient given exercises, stretches and lifestyle modifications  See medications in patient instructions if given  Patient will follow up in 4-8 weeks 

## 2018-05-26 NOTE — Patient Instructions (Signed)
Good to see you  Ice is your friend  Try to fined time for yourself.  Start a walk-run progression:   Once you have reached 30 mins: 2 times a week max - Run 2 mins, then walk 1 min 2nd week -Then run 3 mins, and walk 1 min. 3rd week -Then run 4 mins, and walk 1 min. 4th week -Then run 5 mins, and walk 1 min. -Slowly build up weekly to running 30 mins nonstop.  If painful at any of the steps, back up one step. See me again in 2 months

## 2018-07-19 NOTE — Progress Notes (Signed)
[name] [address] [place], [place] [address] Phone: [phone] Subjective:    I Ronelle Nigh am serving as a Neurosurgeon for Dr. Antoine Primas.    CC: Right hip pain follow-up  BJY:[id number]  Chase Jensen is a 52 y.o. male coming in with complaint of right hip pain. States that his hip is feeling better.  Patient has started running progression and is doing relatively well.  Patient states that in the morning but no significant increase in pain. Patient instructed to increase activity and hopefully will be able to be more active he states.     Past Medical History:  Diagnosis Date  . Coronary artery disease 2006    balloon angioplasty without stent placement to one-vessel done at [facility] in [place]   . Hyperlipemia   . Hypertension   . Hypothyroidism    Past Surgical History:  Procedure Laterality Date  . CARDIAC SURGERY    . CHOLECYSTECTOMY    . COLONOSCOPY WITH PROPOFOL N/A 07/29/2015   Procedure: COLONOSCOPY WITH PROPOFOL;  Surgeon: Elnita Maxwell, MD;  Location: [facility] ENDOSCOPY;  Service: Endoscopy;  Laterality: N/A;  . CORONARY ANGIOPLASTY    . HERNIA REPAIR    . NASAL SINUS SURGERY    . PILONIDAL CYST DRAINAGE    . TONSILLECTOMY     Social History   Socioeconomic History  . Marital status: Married    Spouse name: Not on file  . Number of children: Not on file  . Years of education: Not on file  . Highest education level: Not on file  Occupational History  . Not on file  Social Needs  . Financial resource strain: Not on file  . Food insecurity:    Worry: Not on file    Inability: Not on file  . Transportation needs:    Medical: Not on file    Non-medical: Not on file  Tobacco Use  . Smoking status: Never Smoker  . Smokeless tobacco: Never Used  Substance and Sexual Activity  . Alcohol use: Not on file  . Drug use: Not on file  . Sexual activity: Not on file  Lifestyle  . Physical activity:     Days per week: Not on file    Minutes per session: Not on file  . Stress: Not on file  Relationships  . Social connections:    Talks on phone: Not on file    Gets together: Not on file    Attends religious service: Not on file    Active member of club or organization: Not on file    Attends meetings of clubs or organizations: Not on file    Relationship status: Not on file  Other Topics Concern  . Not on file  Social History Narrative  . Not on file   Allergies  Allergen Reactions  . Penicillins Rash  . Propoxyphene Rash    Darvocet N Acetaminophen   No family history on file.  Current Outpatient Medications (Endocrine & Metabolic):  .  levothyroxine (SYNTHROID, LEVOTHROID) 75 MCG tablet, Take 75 mcg by mouth daily before breakfast.  Current Outpatient Medications (Cardiovascular):  .  lisinopril (PRINIVIL,ZESTRIL) 10 MG tablet, Take 5 mg by mouth.  .  rosuvastatin (CRESTOR) 40 MG tablet, Take 40 mg by mouth daily.   Current Outpatient Medications (Analgesics):  .  aspirin (ASPIRIN EC) 81 MG EC tablet, Take 81 mg by mouth daily. Swallow whole. .  SUMAtriptan (IMITREX) 100 MG tablet,  Current Outpatient Medications (Other):  .  gabapentin (NEURONTIN) 100 MG capsule, Take 2 capsules (200 mg total) by mouth at bedtime.    Past medical history, social, surgical and family history all reviewed in electronic medical record.  No pertanent information unless stated regarding to the chief complaint.   Review of Systems:  No headache, visual changes, nausea, vomiting, diarrhea, constipation, dizziness, abdominal pain, skin rash, fevers, chills, night sweats, weight loss, swollen lymph nodes, body aches, joint swelling, muscle aches, chest pain, shortness of breath, mood changes.   Objective  Blood pressure 110/70, pulse (!) 54, height 6' (1.829 m), weight 224 lb (101.6 kg), SpO2 96 %.    General: No apparent distress alert and oriented x3 mood and affect normal, dressed  appropriately.  HEENT: Pupils equal, extraocular movements intact  Respiratory: Patient's speak in full sentences and does not appear short of breath  Cardiovascular: No lower extremity edema, non tender, no erythema  Skin: Warm dry intact with no signs of infection or rash on extremities or on axial skeleton.  Abdomen: Soft nontender  Neuro: Cranial nerves II through XII are intact, neurovascularly intact in all extremities with 2+ DTRs and 2+ pulses.  Lymph: No lymphadenopathy of posterior or anterior cervical chain or axillae bilaterally.  Gait normal with good balance and coordination.  MSK:  Non tender with full Jensen of motion and good stability and symmetric strength and tone of shoulders, elbows, wrist, hip, knee and ankles bilaterally.  Right hip exam does show decreased Jensen of motion in all planes.  More pain in the groin area with internal rotation.  Negative straight leg test. Neck exam shows tightness in the paraspinal muscles or lumbar spine.  Osteopathic findings C2 flexed rotated and side bent right C6 flexed rotated and side bent left T3 extended rotated and side bent right inhaled third rib T7 extended rotated and side bent left L2 flexed rotated and side bent right Sacrum right on right     Impression and Recommendations:     This case required medical decision making of moderate complexity. The above documentation has been reviewed and is accurate and complete Judi Saa, DO       Note: This dictation was prepared with Dragon dictation along with smaller phrase technology. Any transcriptional errors that result from this process are unintentional.

## 2018-07-21 ENCOUNTER — Encounter: Payer: Self-pay | Admitting: Family Medicine

## 2018-07-21 ENCOUNTER — Ambulatory Visit: Payer: BLUE CROSS/BLUE SHIELD | Admitting: Family Medicine

## 2018-07-21 VITALS — BP 110/70 | HR 54 | Ht 72.0 in | Wt 224.0 lb

## 2018-07-21 DIAGNOSIS — M1611 Unilateral primary osteoarthritis, right hip: Secondary | ICD-10-CM | POA: Diagnosis not present

## 2018-07-21 DIAGNOSIS — M999 Biomechanical lesion, unspecified: Secondary | ICD-10-CM | POA: Diagnosis not present

## 2018-07-21 DIAGNOSIS — M9903 Segmental and somatic dysfunction of lumbar region: Secondary | ICD-10-CM | POA: Diagnosis not present

## 2018-07-21 DIAGNOSIS — M9908 Segmental and somatic dysfunction of rib cage: Secondary | ICD-10-CM

## 2018-07-21 DIAGNOSIS — M9902 Segmental and somatic dysfunction of thoracic region: Secondary | ICD-10-CM

## 2018-07-21 DIAGNOSIS — M9901 Segmental and somatic dysfunction of cervical region: Secondary | ICD-10-CM | POA: Diagnosis not present

## 2018-07-21 DIAGNOSIS — M9904 Segmental and somatic dysfunction of sacral region: Secondary | ICD-10-CM

## 2018-07-21 NOTE — Assessment & Plan Note (Signed)
Decision today to treat with OMT was based on Physical Exam  After verbal consent patient was treated with HVLA, ME, FPR techniques in cervical, thoracic, rib, lumbar and sacral areas  Patient tolerated the procedure well with improvement in symptoms  Patient given exercises, stretches and lifestyle modifications  See medications in patient instructions if given  Patient will follow up in 6-8 weeks 

## 2018-07-21 NOTE — Assessment & Plan Note (Signed)
Patient does have arthritic changes of the hip.  Discussed icing regimen and home exercises.  Discussed which activities. Discussed which activities

## 2018-07-21 NOTE — Patient Instructions (Signed)
Great to see you  Chase Jensen is your friend Keep trucking along on the running regimen  See me again in 6-8 weeks

## 2018-09-08 ENCOUNTER — Ambulatory Visit: Payer: BLUE CROSS/BLUE SHIELD | Admitting: Family Medicine

## 2019-01-09 IMAGING — MR MR HIP*R* W/CM
4 of 5 series · 19 of 40 positions shown · IV contrast (agent unspecified)
Comparison: None.

EXAM:
MRI OF THE RIGHT HIP WITH CONTRAST
TECHNIQUE: Multiplanar, multisequence MR imaging was performed following the
administration of intravenous contrast.

CONTRAST:  See separate arthrogram report

[Series 3: T2 fat-sat · coronal · 4.0mm · 0.74mm/px · 8 of 30 slices shown]
[im 1/30]
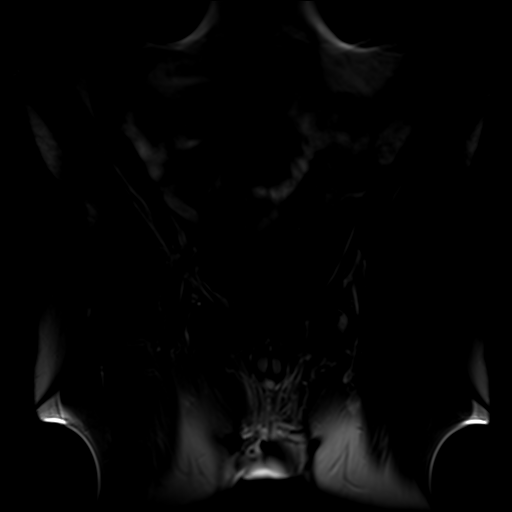
[im 5/30]
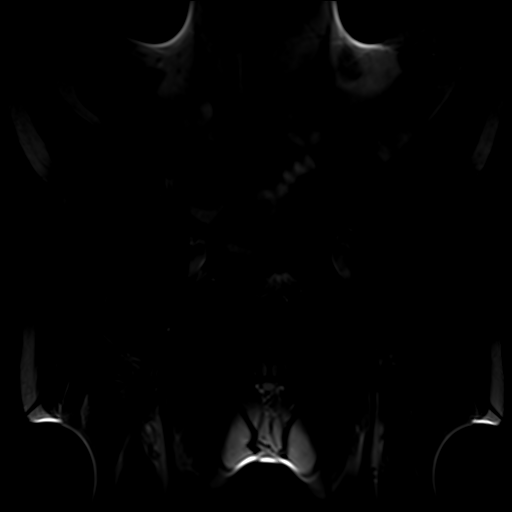
[im 9/30]
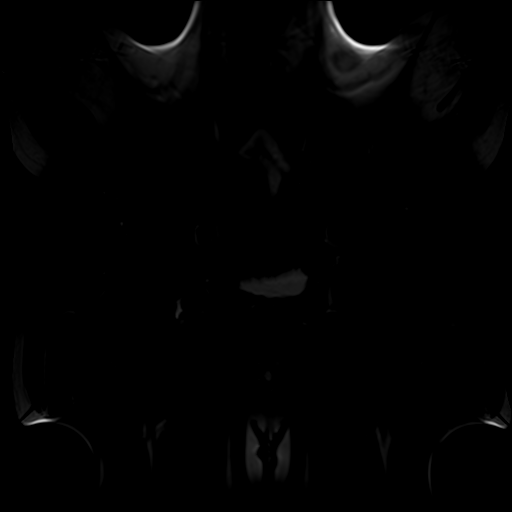
[im 13/30]
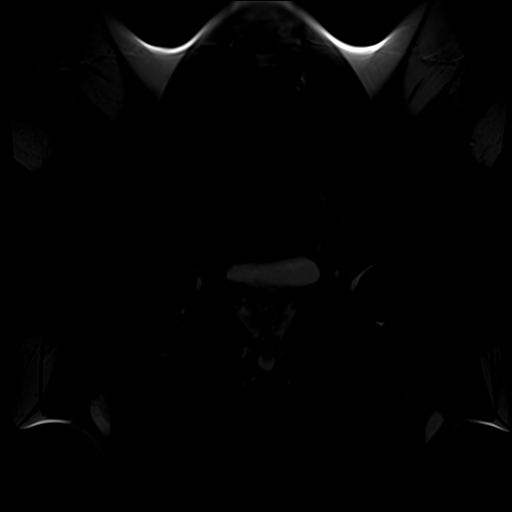
[im 17/30]
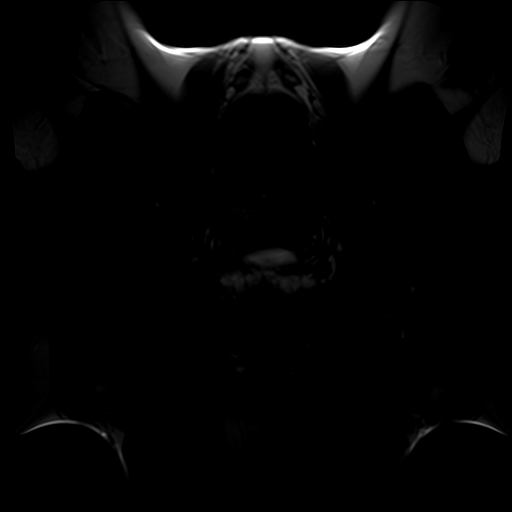
[im 21/30]
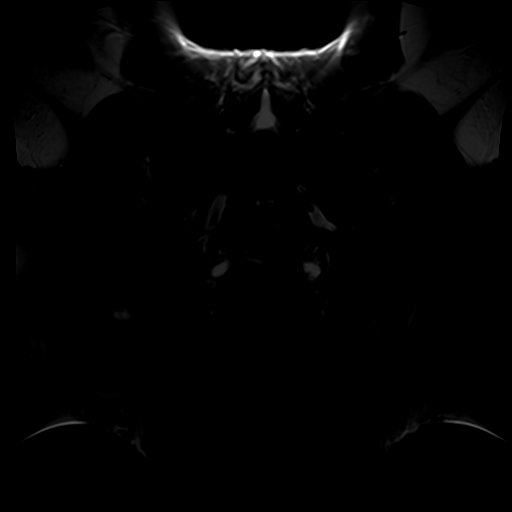
[im 25/30]
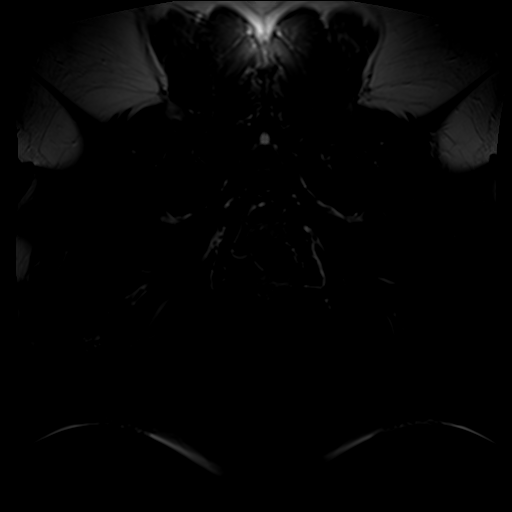
[im 30/30]
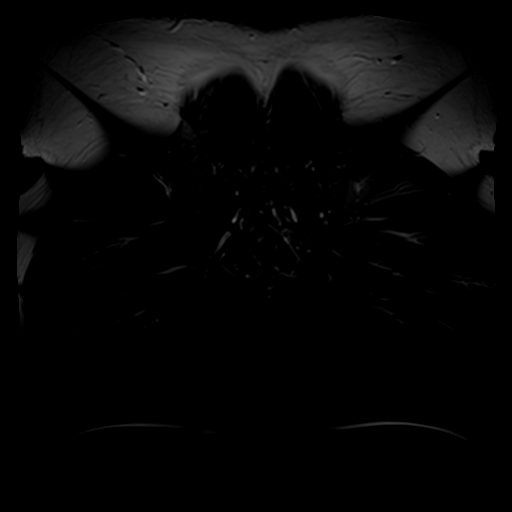

[Series 4: T1 · coronal · 4.0mm · 0.74mm/px · 5 of 30 slices shown]
[im 1/30]
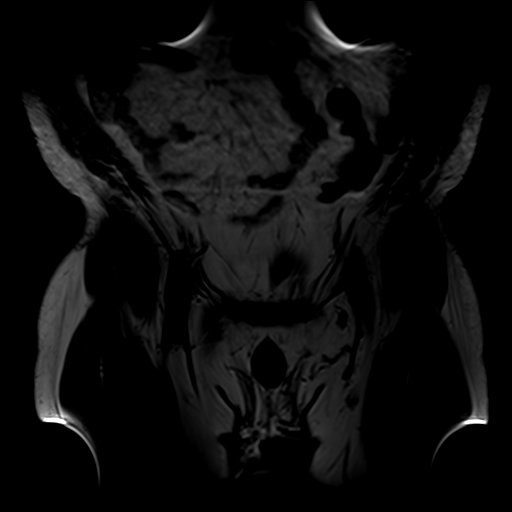
[im 5/30]
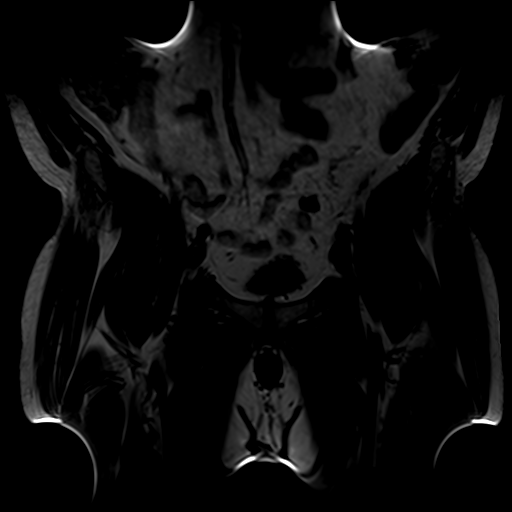
[im 9/30]
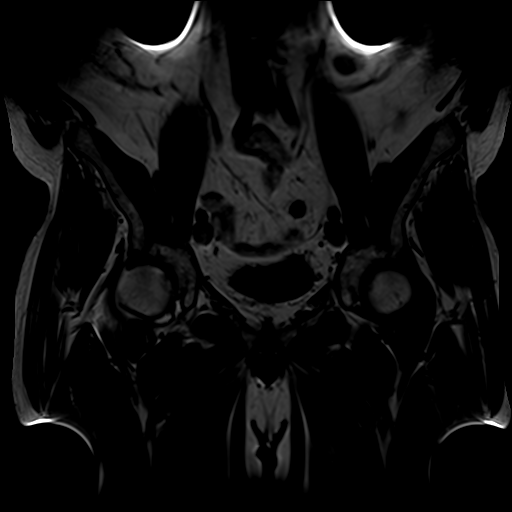
[im 17/30]
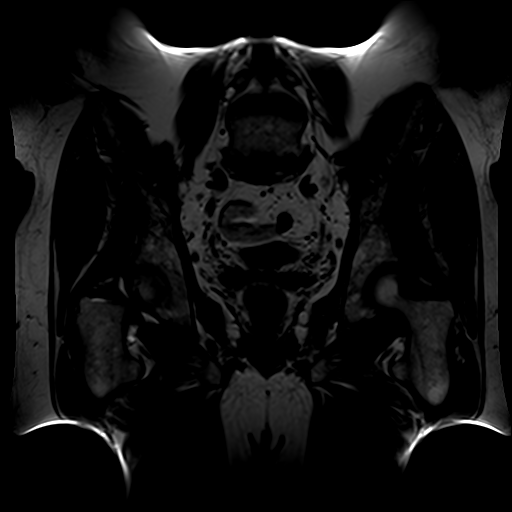
[im 25/30]
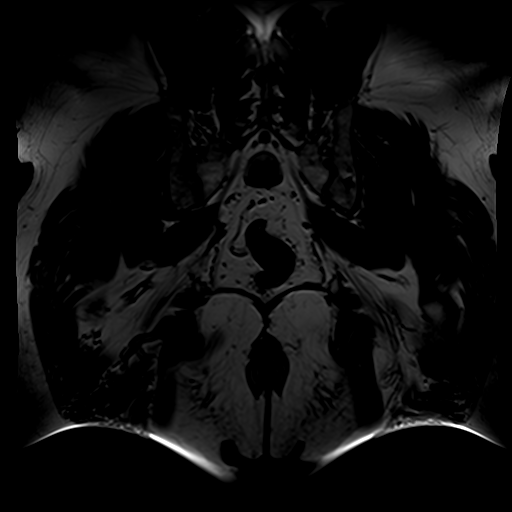

[Series 5: T1 fat-sat · coronal · 4.0mm · 0.35mm/px · 3 of 21 slices shown (1 of 2)]
[im 5/21]
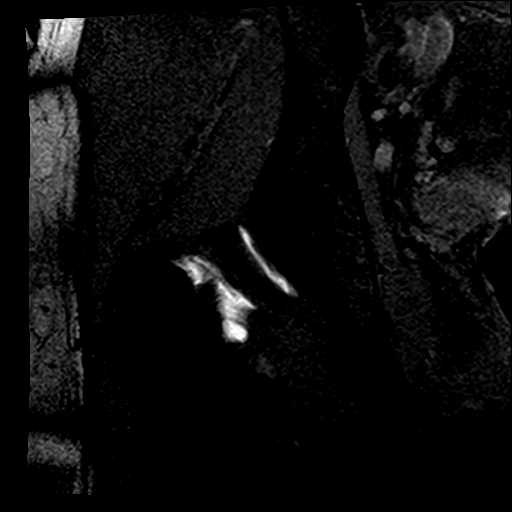
[im 13/21]
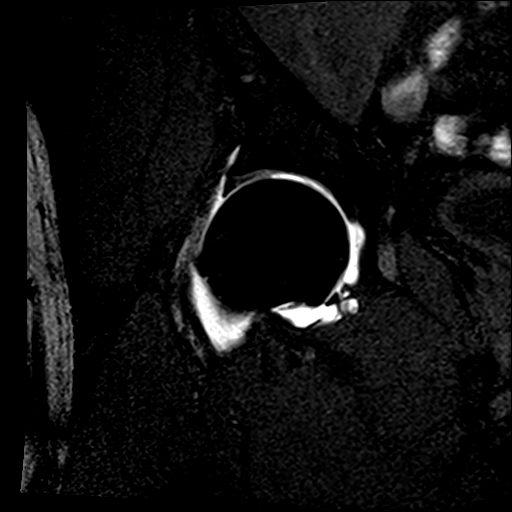
[im 21/21]
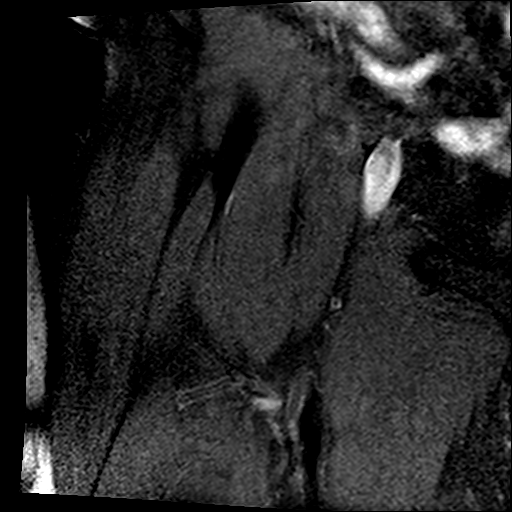

[Series 6: T1 fat-sat · axial · 4.0mm · 0.35mm/px · z∈[-53,+35]mm · 3 of 30 slices shown (2 of 2)]
[im 4/30]
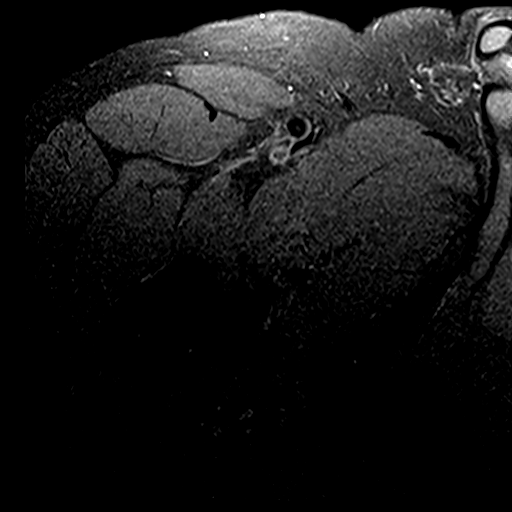
[im 15/30]
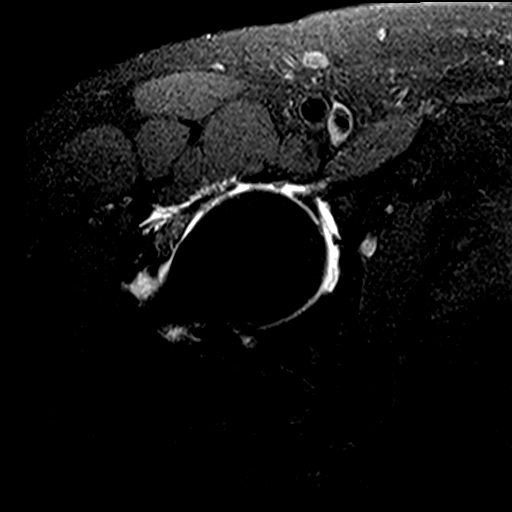
[im 26/30]
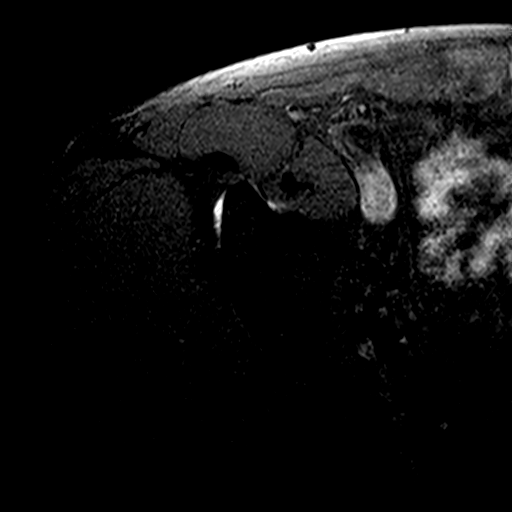

[19 of 40 positions shown; findings below may reference images not displayed]

FINDINGS: Bones: No marrow signal abnormality indicative of fracture or
suspicious osseous lesions. The included lower lumbar spine, bony
pelvis and hips are intact.

Articular cartilage and labrum

Articular cartilage: Chondrosis of the right femoral head and
acetabular cartilage ranging from half to full-thickness, grades
2-4. No subchondral degenerative cystic change however is currently
identified.

Labrum:  Intact

Joint or bursal effusion

Joint effusion: The right hip joint is distended with
intra-articular contrast.

Bursae: No bursal fluid collections.

Muscles and tendons

Muscles and tendons:  Negative

Other findings

Miscellaneous:   None
IMPRESSION: Chondrosis of the right hip with irregular grades 2 through 4
chondral loss involving the right femoral head and acetabular
cartilage. No labral tear or suspicious osseous abnormality.

## 2019-03-18 ENCOUNTER — Telehealth: Payer: Self-pay

## 2019-03-18 NOTE — Telephone Encounter (Signed)
LMOVM need to schedule an appointment . Past due for 12 mo follow up.

## 2019-06-26 ENCOUNTER — Encounter: Payer: Self-pay | Admitting: Cardiovascular Disease

## 2019-06-26 ENCOUNTER — Other Ambulatory Visit: Payer: Self-pay

## 2019-06-26 ENCOUNTER — Telehealth: Payer: Self-pay | Admitting: Cardiovascular Disease

## 2019-06-26 ENCOUNTER — Ambulatory Visit (INDEPENDENT_AMBULATORY_CARE_PROVIDER_SITE_OTHER): Payer: Self-pay | Admitting: Cardiovascular Disease

## 2019-06-26 VITALS — BP 120/80 | HR 68 | Ht 72.0 in | Wt 226.5 lb

## 2019-06-26 DIAGNOSIS — I2510 Atherosclerotic heart disease of native coronary artery without angina pectoris: Secondary | ICD-10-CM

## 2019-06-26 DIAGNOSIS — I10 Essential (primary) hypertension: Secondary | ICD-10-CM

## 2019-06-26 DIAGNOSIS — E785 Hyperlipidemia, unspecified: Secondary | ICD-10-CM

## 2019-06-26 MED ORDER — EZETIMIBE 10 MG PO TABS: 10.0000 mg | ORAL_TABLET | Freq: Every day | ORAL | 3 refills | Status: DC

## 2019-06-26 NOTE — Telephone Encounter (Signed)
Pt c/o medication issue:  1. Name of Medication: zetia   2. How are you currently taking this medication (dosage and times per day)? Not taking yet  3. Are you having a reaction (difficulty breathing--STAT)? No but Feet leg cramping in the past   4. What is your medication issue? Patient states he took in 2014 and could not tolerate

## 2019-06-26 NOTE — Telephone Encounter (Signed)
Update fwd Dr. Fletcher Anon.

## 2019-06-26 NOTE — Telephone Encounter (Signed)
I still think it is worth trying given that it was a long time when he had the medication and things might be different now.  If he does not tolerate Zetia, the next step is injectable cholesterol medication such as Praluent or Repatha.

## 2019-06-26 NOTE — Progress Notes (Signed)
Cardiology Office Note   Date:  06/26/2019   ID:  Chase Jensen, DOB 04/14/1966, MRN [id number]  PCP:  Derinda Late, MD  Cardiologist:   Kathlyn Sacramento, MD   Chief Complaint  Patient presents with  . other    12 month f/u no complaints today. Meds reviewed verbally with pt.      History of Present Illness: Chase Jensen is a 53 y.o. male who is here today for follow-up visit regarding coronary artery disease.   He had his prior care and [facility] and then [facility]. He underwent balloon angioplasty without stent placement in 2006 in [place]. At that time, he was told that no stent was placed due to bifurcation location. He has known history of hypertension and severe hyperlipidemia. There is family history of premature coronary artery disease. His father had CABG at the age of 56 and did after a few months of his surgery. His mother had cancer. The patient is not a smoker. He is a Theme park manager. He reports having stress testing done a few times in the past most recently in 2015 which was unremarkable.  He has been doing very well with no exertional chest pain, shortness of breath or palpitations.  No side effects with medications.    Past Medical History:  Diagnosis Date  . Coronary artery disease 2006    balloon angioplasty without stent placement to one-vessel done at [facility] in [place]   . Hyperlipemia   . Hypertension   . Hypothyroidism     Past Surgical History:  Procedure Laterality Date  . CARDIAC SURGERY    . CHOLECYSTECTOMY    . COLONOSCOPY WITH PROPOFOL N/A 07/29/2015   Procedure: COLONOSCOPY WITH PROPOFOL;  Surgeon: Josefine Class, MD;  Location: [facility] ENDOSCOPY;  Service: Endoscopy;  Laterality: N/A;  . CORONARY ANGIOPLASTY    . HERNIA REPAIR    . NASAL SINUS SURGERY    . PILONIDAL CYST DRAINAGE    . TONSILLECTOMY       Current Outpatient Medications  Medication Sig Dispense Refill  .  aspirin (ASPIRIN EC) 81 MG EC tablet Take 81 mg by mouth daily. Swallow whole.    . gabapentin (NEURONTIN) 100 MG capsule Take 2 capsules (200 mg total) by mouth at bedtime. 180 capsule 3  . levothyroxine (SYNTHROID) 100 MCG tablet Take by mouth daily.    Marland Kitchen lisinopril (PRINIVIL,ZESTRIL) 10 MG tablet Take 5 mg by mouth.     . rosuvastatin (CRESTOR) 40 MG tablet Take 40 mg by mouth daily.    . SUMAtriptan (IMITREX) 100 MG tablet      No current facility-administered medications for this visit.     Allergies:   Penicillins and Propoxyphene    Social History:  The patient  reports that he has never smoked. He has never used smokeless tobacco.   Family History:  The patient's Family history is remarkable for premature coronary artery disease. Father had CABG at the age of 59.   ROS:  Please see the history of present illness.   Otherwise, review of systems are positive for none.   All other systems are reviewed and negative.    PHYSICAL EXAM: VS:  BP 120/80 (BP Location: Left Arm, Patient Position: Sitting, Cuff Size: Normal)   Pulse 68   Ht 6' (1.829 m)   Wt 226 lb 8 oz (102.7 kg)   SpO2 99%   BMI 30.72 kg/m  , BMI Body mass index is  30.72 kg/m. GEN: Well nourished, well developed, in no acute distress  HEENT: normal  Neck: no JVD, carotid bruits, or masses Cardiac: RRR; no murmurs, rubs, or gallops,no edema  Respiratory:  clear to auscultation bilaterally, normal work of breathing GI: soft, nontender, nondistended, + BS MS: no deformity or atrophy  Skin: warm and dry, no rash Neuro:  Strength and sensation are intact Psych: euthymic mood, full affect   EKG:  EKG is ordered today. The ekg ordered today demonstrates normal sinus rhythm with no significant ST or T wave changes.   Recent Labs: No results found for requested labs within last 8760 hours.    Lipid Panel No results found for: CHOL, TRIG, HDL, CHOLHDL, VLDL, LDLCALC, LDLDIRECT    Wt Readings from Last 3  Encounters:  06/26/19 226 lb 8 oz (102.7 kg)  07/21/18 224 lb (101.6 kg)  05/26/18 215 lb (97.5 kg)     PAD Screen 06/24/2017  Previous PAD dx? No  Previous surgical procedure? No  Pain with walking? No  Feet/toe relief with dangling? No  Painful, non-healing ulcers? No  Extremities discolored? No      ASSESSMENT AND PLAN:  1.  Coronary artery disease involving native coronary arteries without angina: He is doing very well with no anginal symptoms. Cardiac exam is normal and baseline ECG is unremarkable. Continue medical therapy.  2. Hyperlipidemia: I reviewed most recent lipid profile from this month which showed a total cholesterol of 170, triglyceride of 127, HDL of 50 and an LDL of 94.  His LDL has been consistently above 70 over the last few years in spite of maximal dose rosuvastatin.  Given his known history of coronary artery disease we should get his LDL below 70 and due to that I elected to add Zetia 10 mg daily.  Repeat labs in 2 months.  3. Essential hypertension: Blood pressure is controlled on small dose losartan.    Disposition:   FU with me in 1 year  Signed,  Lorine BearsMuhammad Tonianne Fine, MD  06/26/2019 9:23 AM    [facility]

## 2019-06-26 NOTE — Patient Instructions (Addendum)
Medication Instructions:  Your physician has recommended you make the following change in your medication:   START Zetia 10mg  daily. An Rx has been sent to your pharmacy  If you need a refill on your cardiac medications before your next appointment, please call your pharmacy.   Lab work: Your physician recommends that you return for a FASTING lipid profile and hepatic in 2 months  Please have your labs drawn at [facility]. You do not need an appointment. Hours are Mon-Fri 7am-5:30pm  If you have labs (blood work) drawn today and your tests are completely normal, you will receive your results only by: Marland Kitchen MyChart Message (if you have MyChart) OR . A paper copy in the mail If you have any lab test that is abnormal or we need to change your treatment, we will call you to review the results.  Testing/Procedures: None ordered  Follow-Up: At [facility], you and your health needs are our priority.  As part of our continuing mission to provide you with exceptional heart care, we have created designated Provider Care Teams.  These Care Teams include your primary Cardiologist (physician) and Advanced Practice Providers (APPs -  Physician Assistants and Nurse Practitioners) who all work together to provide you with the care you need, when you need it. You will need a follow up appointment in 12 months.  Please call our office 2 months in advance to schedule this appointment.  You may see  Dr. Fletcher Anon or one of the following Advanced Practice Providers on your designated Care Team:   Murray Hodgkins, NP Christell Faith, PA-C . Marrianne Mood, PA-C  Any Other Special Instructions Will Be Listed Below (If Applicable). N/A

## 2019-06-29 NOTE — Telephone Encounter (Signed)
Patient made aware of Dr. Tyrell Antonio response and recommendation. Patient is agreeable with retrying Zetia. He will contact the office if symptoms develop.

## 2019-06-29 NOTE — Telephone Encounter (Signed)
Patient returned phone call. Please advise when able

## 2019-06-29 NOTE — Telephone Encounter (Signed)
Called to give the patient Dr. Arida's recommendation. [email or web address] 

## 2019-08-07 ENCOUNTER — Other Ambulatory Visit
Admission: RE | Admit: 2019-08-07 | Discharge: 2019-08-07 | Disposition: A | Discharge status: Home / Self Care | Payer: Self-pay | Attending: Cardiovascular Disease | Admitting: Cardiovascular Disease

## 2019-08-07 DIAGNOSIS — E785 Hyperlipidemia, unspecified: Secondary | ICD-10-CM | POA: Insufficient documentation

## 2019-08-07 LAB — LIPID PANEL
Cholesterol: 115 mg/dL (ref 0–200)
HDL: 47 mg/dL (ref >40)
LDL Cholesterol: 51 mg/dL (ref 0–99)
Total CHOL/HDL Ratio: 2.4 RATIO
Triglycerides: 87 mg/dL (ref <150)
VLDL: 17 mg/dL (ref 0–40)

## 2019-08-07 LAB — HEPATIC FUNCTION PANEL
ALT: 61 U/L — ABNORMAL HIGH (ref 0–44)
AST: 38 U/L (ref 15–41)
Albumin: 3.8 g/dL (ref 3.5–5.0)
Alkaline Phosphatase: 55 U/L (ref 38–126)
Bilirubin, Direct: <0.1 mg/dL (ref 0.0–0.2)
Total Bilirubin: 0.8 mg/dL (ref 0.3–1.2)
Total Protein: 7.3 g/dL (ref 6.5–8.1)

## 2020-06-30 ENCOUNTER — Encounter: Payer: Self-pay | Admitting: Cardiovascular Disease

## 2020-06-30 ENCOUNTER — Ambulatory Visit (INDEPENDENT_AMBULATORY_CARE_PROVIDER_SITE_OTHER): Payer: Self-pay | Admitting: Cardiovascular Disease

## 2020-06-30 ENCOUNTER — Other Ambulatory Visit: Payer: Self-pay

## 2020-06-30 VITALS — BP 130/80 | HR 57 | Ht 72.0 in | Wt 228.0 lb

## 2020-06-30 DIAGNOSIS — E785 Hyperlipidemia, unspecified: Secondary | ICD-10-CM

## 2020-06-30 DIAGNOSIS — I2510 Atherosclerotic heart disease of native coronary artery without angina pectoris: Secondary | ICD-10-CM

## 2020-06-30 DIAGNOSIS — I10 Essential (primary) hypertension: Secondary | ICD-10-CM

## 2020-06-30 NOTE — Patient Instructions (Signed)
Medication Instructions:  Your physician recommends that you continue on your current medications as directed. Please refer to the Current Medication list given to you today.  *If you need a refill on your cardiac medications before your next appointment, please call your pharmacy*   Lab Work: None ordered If you have labs (blood work) drawn today and your tests are completely normal, you will receive your results only by: . MyChart Message (if you have MyChart) OR . A paper copy in the mail If you have any lab test that is abnormal or we need to change your treatment, we will call you to review the results.   Testing/Procedures: None ordered   Follow-Up: At [facility], you and your health needs are our priority.  As part of our continuing mission to provide you with exceptional heart care, we have created designated Provider Care Teams.  These Care Teams include your primary Cardiologist (physician) and Advanced Practice Providers (APPs -  Physician Assistants and Nurse Practitioners) who all work together to provide you with the care you need, when you need it.  We recommend signing up for the patient portal called "MyChart".  Sign up information is provided on this After Visit Summary.  MyChart is used to connect with patients for Virtual Visits (Telemedicine).  Patients are able to view lab/test results, encounter notes, upcoming appointments, etc.  Non-urgent messages can be sent to your provider as well.   To learn more about what you can do with MyChart, go to [email or web address]://www.mychart.com.    Your next appointment:   12 month(s)  The format for your next appointment:   In Person  Provider:   You may see Dr. Arida or one of the following Advanced Practice Providers on your designated Care Team:    Christopher Berge, NP  Ryan Dunn, PA-C  Jacquelyn Visser, PA-C  Cadence Furth, PA-C    Other Instructions N/A  

## 2020-06-30 NOTE — Progress Notes (Signed)
Cardiology Office Note   Date:  06/30/2020   ID:  Chase Jensen, DOB Aug 10, 1966, MRN [id number]  PCP:  Kandyce Rud, MD  Cardiologist:   Lorine Bears, MD   Chief Complaint  Patient presents with  . Follow-up    Annual follow up. Medications verbally reviewed with patient.       History of Present Illness: Chase Jensen is a 54 y.o. male who is here today for follow-up visit regarding coronary artery disease.   He had his prior care in [place], [place] and then [place], [place]. He underwent balloon angioplasty without stent placement in 2006 in [place]. At that time, he was told that no stent was placed due to bifurcation location. He has known history of hypertension and severe hyperlipidemia. There is family history of premature coronary artery disease. His father had CABG at the age of 65 and did after a few months of his surgery. His mother had cancer. 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  He has been doing very well with no recent chest pain, shortness of breath or palpitations.  He takes his medications regularly.   Past Medical History:  Diagnosis Date  . Coronary artery disease 2006    balloon angioplasty without stent placement to one-vessel done at [facility] in [place]   . Hyperlipemia   . Hypertension   . Hypothyroidism     Past Surgical History:  Procedure Laterality Date  . CARDIAC SURGERY    . CHOLECYSTECTOMY    . COLONOSCOPY WITH PROPOFOL N/A 07/29/2015   Procedure: COLONOSCOPY WITH PROPOFOL;  Surgeon: Elnita Maxwell, MD;  Location: [facility] ENDOSCOPY;  Service: Endoscopy;  Laterality: N/A;  . CORONARY ANGIOPLASTY    . HERNIA REPAIR    . NASAL SINUS SURGERY    . PILONIDAL CYST DRAINAGE    . TONSILLECTOMY       Current Outpatient Medications  Medication Sig Dispense Refill  . aspirin (ASPIRIN EC) 81 MG EC tablet Take 81 mg by mouth daily. Swallow whole.    . ezetimibe (ZETIA) 10 MG tablet  Take 1 tablet (10 mg total) by mouth daily. 90 tablet 3  . levothyroxine (SYNTHROID) 112 MCG tablet Take by mouth.    Marland Kitchen lisinopril (PRINIVIL,ZESTRIL) 10 MG tablet Take 5 mg by mouth.     . rosuvastatin (CRESTOR) 40 MG tablet Take 40 mg by mouth daily.     No current facility-administered medications for this visit.    Allergies:   Penicillins and Propoxyphene    Social History:  The patient  reports that he has never smoked. He has never used smokeless tobacco.   Family History:  The patient's Family history is remarkable for premature coronary artery disease. Father had CABG at the age of 87.   ROS:  Please see the history of present illness.   Otherwise, review of systems are positive for none.   All other systems are reviewed and negative.    PHYSICAL EXAM: VS:  BP 130/80 (BP Location: Left Arm, Patient Position: Sitting, Cuff Size: Normal)   Pulse (!) 57   Ht 6' (1.829 m)   Wt 228 lb (103.4 kg)   SpO2 98%   BMI 30.92 kg/m  , BMI Body mass index is 30.92 kg/m. GEN: Well nourished, well developed, in no acute distress  HEENT: normal  Neck: no JVD, carotid bruits, or masses Cardiac: RRR; no murmurs, rubs, or gallops,no edema  Respiratory:  clear to auscultation  bilaterally, normal work of breathing GI: soft, nontender, nondistended, + BS MS: no deformity or atrophy  Skin: warm and dry, no rash Neuro:  Strength and sensation are intact Psych: euthymic mood, full affect   EKG:  EKG is ordered today. The ekg ordered today demonstrates sinus bradycardia with no significant ST or T wave changes.   Recent Labs: 08/07/2019: ALT 61    Lipid Panel    Component Value Date/Time   CHOL 115 08/07/2019 0921   TRIG 87 08/07/2019 0921   HDL 47 08/07/2019 0921   CHOLHDL 2.4 08/07/2019 0921   VLDL 17 08/07/2019 0921   LDLCALC 51 08/07/2019 0921      Wt Readings from Last 3 Encounters:  06/30/20 228 lb (103.4 kg)  06/26/19 226 lb 8 oz (102.7 kg)  07/21/18 224 lb (101.6  kg)     PAD Screen 06/24/2017  Previous PAD dx? No  Previous surgical procedure? No  Pain with walking? No  Feet/toe relief with dangling? No  Painful, non-healing ulcers? No  Extremities discolored? No      ASSESSMENT AND PLAN:  1.  Coronary artery disease involving native coronary arteries without angina: He is doing very well with no anginal symptoms. Cardiac exam is normal and baseline ECG is unremarkable. Continue medical therapy.  2. Hyperlipidemia: Zetia was added last year with improvement in lipid profile.  He is also on high-dose rosuvastatin.  Most recent lipid profile with his primary care physician showed an LDL of 76 which is close to target.   3. Essential hypertension: Blood pressure is controlled on lisinopril    Disposition:   FU with me in 1 year  Signed,  Lorine Bears, MD  06/30/2020 2:03 PM    [facility]

## 2021-06-01 ENCOUNTER — Ambulatory Visit: Payer: Self-pay | Admitting: Cardiovascular Disease

## 2021-07-06 ENCOUNTER — Other Ambulatory Visit: Payer: Self-pay

## 2021-07-06 ENCOUNTER — Encounter: Payer: Self-pay | Admitting: Cardiovascular Disease

## 2021-07-06 ENCOUNTER — Ambulatory Visit (INDEPENDENT_AMBULATORY_CARE_PROVIDER_SITE_OTHER): Payer: Self-pay | Admitting: Cardiovascular Disease

## 2021-07-06 VITALS — BP 156/78 | HR 56 | Ht 72.0 in | Wt 220.1 lb

## 2021-07-06 DIAGNOSIS — I2510 Atherosclerotic heart disease of native coronary artery without angina pectoris: Secondary | ICD-10-CM

## 2021-07-06 DIAGNOSIS — I10 Essential (primary) hypertension: Secondary | ICD-10-CM

## 2021-07-06 DIAGNOSIS — E785 Hyperlipidemia, unspecified: Secondary | ICD-10-CM

## 2021-07-06 NOTE — Progress Notes (Signed)
Cardiology Office Note   Date:  07/06/2021   ID:  Chase Jensen, DOB Sep 05, 1966, MRN [id number]  PCP:  Kandyce Rud, MD  Cardiologist:   Chase Bears, MD   Chief Complaint  Patient presents with   Other    12 month f/u c/o zetia due to leg cramps. Meds reviewed verbally with pt.      History of Present Illness: Chase Jensen is a 55 y.o. male who is here today for follow-up visit regarding coronary artery disease.   He had his prior care in [place], [place] and then [place], [place]. He underwent balloon angioplasty without stent placement in 2006 in [place]. At that time, he was told that no stent was placed due to bifurcation location. He has known history of hypertension and severe hyperlipidemia. There is family history of premature coronary artery disease. His father had CABG at the age of 13 and did after a few months of his surgery. His mother had cancer. 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  He has been doing very well with no chest pain, shortness of breath or palpitations.  He came back from a bear hunting trip in [place] with no significant issues at all.  His blood pressure is elevated today but he brought his home blood pressure readings.  Blood pressure at home is well controlled with systolic blood pressure ranging from 120 to 130 mmHg.   Past Medical History:  Diagnosis Date   Coronary artery disease 2006    balloon angioplasty without stent placement to one-vessel done at [facility] in [facility]    Hyperlipemia    Hypertension    Hypothyroidism     Past Surgical History:  Procedure Laterality Date   CARDIAC SURGERY     CHOLECYSTECTOMY     COLONOSCOPY WITH PROPOFOL N/A 07/29/2015   Procedure: COLONOSCOPY WITH PROPOFOL;  Surgeon: Elnita Maxwell, MD;  Location: [facility] ENDOSCOPY;  Service: Endoscopy;  Laterality: N/A;   CORONARY ANGIOPLASTY     HERNIA REPAIR     NASAL SINUS SURGERY     PILONIDAL  CYST DRAINAGE     TONSILLECTOMY       Current Outpatient Medications  Medication Sig Dispense Refill   Ascorbic Acid (VITAMIN C PO) Take by mouth daily.     aspirin 81 MG EC tablet Take 81 mg by mouth daily. Swallow whole.     fluticasone (FLONASE) 50 MCG/ACT nasal spray Place into the nose as needed.     levothyroxine (SYNTHROID) 125 MCG tablet Take by mouth daily.     lisinopril (PRINIVIL,ZESTRIL) 10 MG tablet Take 5 mg by mouth.      Multiple Vitamins-Minerals (ZINC PO) Take by mouth daily.     multivitamin-lutein (OCUVITE-LUTEIN) CAPS capsule Take 1 capsule by mouth daily.     rosuvastatin (CRESTOR) 40 MG tablet Take 40 mg by mouth daily.     No current facility-administered medications for this visit.    Allergies:   Zetia [ezetimibe], Penicillins, and Propoxyphene    Social History:  The patient  reports that he has never smoked. He has never used smokeless tobacco.   Family History:  The patient's Family history is remarkable for premature coronary artery disease. Father had CABG at the age of 71.   ROS:  Please see the history of present illness.   Otherwise, review of systems are positive for none.   All other systems are reviewed and negative.    PHYSICAL  EXAM: VS:  BP (!) 156/78 (BP Location: Left Arm, Patient Position: Sitting, Cuff Size: Normal)   Pulse (!) 56   Ht 6' (1.829 m)   Wt 220 lb 2 oz (99.8 kg)   SpO2 98%   BMI 29.85 kg/m  , BMI Body mass index is 29.85 kg/m. GEN: Well nourished, well developed, in no acute distress  HEENT: normal  Neck: no JVD, carotid bruits, or masses Cardiac: RRR; no murmurs, rubs, or gallops,no edema  Respiratory:  clear to auscultation bilaterally, normal work of breathing GI: soft, nontender, nondistended, + BS MS: no deformity or atrophy  Skin: warm and dry, no rash Neuro:  Strength and sensation are intact Psych: euthymic mood, full affect   EKG:  EKG is ordered today. The ekg ordered today demonstrates sinus  bradycardia with no significant ST or T wave changes.   Recent Labs: No results found for requested labs within last 8760 hours.    Lipid Panel    Component Value Date/Time   CHOL 115 08/07/2019 0921   TRIG 87 08/07/2019 0921   HDL 47 08/07/2019 0921   CHOLHDL 2.4 08/07/2019 0921   VLDL 17 08/07/2019 0921   LDLCALC 51 08/07/2019 0921      Wt Readings from Last 3 Encounters:  07/06/21 220 lb 2 oz (99.8 kg)  06/30/20 228 lb (103.4 kg)  06/26/19 226 lb 8 oz (102.7 kg)     PAD Screen 06/24/2017  Previous PAD dx? No  Previous surgical procedure? No  Pain with walking? No  Feet/toe relief with dangling? No  Painful, non-healing ulcers? No  Extremities discolored? No      ASSESSMENT AND PLAN:  1.  Coronary artery disease involving native coronary arteries without angina: He is doing very well with no anginal symptoms. Cardiac exam is normal and baseline ECG is unremarkable. Continue medical therapy.  2. Hyperlipidemia: He stopped taking Zetia due to leg cramping but he continues to take high-dose rosuvastatin with no issues.  I reviewed his most recent labs done in March which were overall unremarkable.  Lipid profile showed an LDL of 77 which is close to target.  3. Essential hypertension: Blood pressure is elevated today but well controlled on home blood pressure readings.  Continue lisinopril for now.    Disposition:   FU with me in 1 year  Signed,  Chase Bears, MD  07/06/2021 4:14 PM    [facility]

## 2021-07-06 NOTE — Patient Instructions (Signed)

## 2022-07-10 ENCOUNTER — Ambulatory Visit: Payer: Self-pay | Attending: Cardiovascular Disease | Admitting: Cardiovascular Disease

## 2022-07-10 ENCOUNTER — Encounter: Payer: Self-pay | Admitting: Cardiovascular Disease

## 2022-07-10 VITALS — BP 118/64 | HR 65 | Ht 72.0 in | Wt 228.2 lb

## 2022-07-10 DIAGNOSIS — I10 Essential (primary) hypertension: Secondary | ICD-10-CM

## 2022-07-10 DIAGNOSIS — I2510 Atherosclerotic heart disease of native coronary artery without angina pectoris: Secondary | ICD-10-CM

## 2022-07-10 DIAGNOSIS — E785 Hyperlipidemia, unspecified: Secondary | ICD-10-CM

## 2022-07-10 NOTE — Patient Instructions (Signed)
Medication Instructions:  Your physician recommends that you continue on your current medications as directed. Please refer to the Current Medication list given to you today.  *If you need a refill on your cardiac medications before your next appointment, please call your pharmacy*   Lab Work: None ordered If you have labs (blood work) drawn today and your tests are completely normal, you will receive your results only by: MyChart Message (if you have MyChart) OR A paper copy in the mail If you have any lab test that is abnormal or we need to change your treatment, we will call you to review the results.   Testing/Procedures: None ordered   Follow-Up: At [facility], you and your health needs are our priority.  As part of our continuing mission to provide you with exceptional heart care, we have created designated Provider Care Teams.  These Care Teams include your primary Cardiologist (physician) and Advanced Practice Providers (APPs -  Physician Assistants and Nurse Practitioners) who all work together to provide you with the care you need, when you need it.  We recommend signing up for the patient portal called "MyChart".  Sign up information is provided on this After Visit Summary.  MyChart is used to connect with patients for Virtual Visits (Telemedicine).  Patients are able to view lab/test results, encounter notes, upcoming appointments, etc.  Non-urgent messages can be sent to your provider as well.   To learn more about what you can do with MyChart, go to [email or web address].    Your next appointment:   Your physician wants you to follow-up in: 1 year You will receive a reminder letter in the mail two months in advance. If you don't receive a letter, please call our office to schedule the follow-up appointment.   The format for your next appointment:   In Person  Provider:   You may see Muhammad Arida, MD or one of the following Advanced Practice Providers on  your designated Care Team:   Christopher Berge, NP Ryan Dunn, PA-C Cadence Furth, PA-C Sheri Hammock, NP    Other Instructions N/A  Important Information About Sugar       

## 2022-07-10 NOTE — Progress Notes (Signed)
Cardiology Office Note   Date:  07/10/2022   ID:  DELVIN HEDEEN, DOB 05-09-1966, MRN [id number]  PCP:  Kandyce Rud, MD  Cardiologist:   Lorine Bears, MD   Chief Complaint  Patient presents with   Other    12 Month f/u no complaints today. Meds reviewed verbally with pt.      History of Present Illness: Chase Jensen is a 56 y.o. male who is here today for follow-up visit regarding coronary artery disease.   He had his prior care in [place], [place] and then [place], [place]. He underwent balloon angioplasty without stent placement in 2006 in [place]. At that time, he was told that no stent was placed due to bifurcation location. He has known history of hypertension and severe hyperlipidemia. There is family history of premature coronary artery disease. His father had CABG at the age of 49. His mother had cancer. 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  He has been doing very well with no chest pain, shortness of breath or palpitations.    He reports leg cramps at night that did not improve with magnesium supplement.  His LDL has increased from before but he does admit that he has not been following healthy diet as before.  He continues to take rosuvastatin 40 mg daily.  Past Medical History:  Diagnosis Date   Coronary artery disease 2006    balloon angioplasty without stent placement to one-vessel done at [facility] in [facility]    Hyperlipemia    Hypertension    Hypothyroidism     Past Surgical History:  Procedure Laterality Date   CARDIAC SURGERY     CHOLECYSTECTOMY     COLONOSCOPY WITH PROPOFOL N/A 07/29/2015   Procedure: COLONOSCOPY WITH PROPOFOL;  Surgeon: Elnita Maxwell, MD;  Location: [facility] ENDOSCOPY;  Service: Endoscopy;  Laterality: N/A;   CORONARY ANGIOPLASTY     HERNIA REPAIR     NASAL SINUS SURGERY     PILONIDAL CYST DRAINAGE     TONSILLECTOMY       Current Outpatient Medications  Medication  Sig Dispense Refill   Ascorbic Acid (VITAMIN C PO) Take by mouth daily.     aspirin 81 MG EC tablet Take 81 mg by mouth daily. Swallow whole.     fluticasone (FLONASE) 50 MCG/ACT nasal spray Place into the nose as needed.     levothyroxine (SYNTHROID) 137 MCG tablet Take by mouth daily.     lisinopril (PRINIVIL,ZESTRIL) 10 MG tablet Take 5 mg by mouth.      multivitamin-lutein (OCUVITE-LUTEIN) CAPS capsule Take 1 capsule by mouth daily.     rosuvastatin (CRESTOR) 40 MG tablet Take 40 mg by mouth daily.     No current facility-administered medications for this visit.    Allergies:   Zetia [ezetimibe], Penicillins, and Propoxyphene    Social History:  The patient  reports that he has never smoked. He has never used smokeless tobacco.   Family History:  The patient's Family history is remarkable for premature coronary artery disease. Father had CABG at the age of 61.   ROS:  Please see the history of present illness.   Otherwise, review of systems are positive for none.   All other systems are reviewed and negative.    PHYSICAL EXAM: VS:  BP 118/64 (BP Location: Left Arm, Patient Position: Sitting, Cuff Size: Normal)   Pulse 65   Ht 6' (1.829 m)   Wt 228  lb 4 oz (103.5 kg)   SpO2 98%   BMI 30.96 kg/m  , BMI Body mass index is 30.96 kg/m. GEN: Well nourished, well developed, in no acute distress  HEENT: normal  Neck: no JVD, carotid bruits, or masses Cardiac: RRR; no murmurs, rubs, or gallops,no edema  Respiratory:  clear to auscultation bilaterally, normal work of breathing GI: soft, nontender, nondistended, + BS MS: no deformity or atrophy  Skin: warm and dry, no rash Neuro:  Strength and sensation are intact Psych: euthymic mood, full affect   EKG:  EKG is ordered today. The ekg ordered today demonstrates sinus bradycardia with no significant ST or T wave changes.   Recent Labs: No results found for requested labs within last 365 days.    Lipid Panel    Component  Value Date/Time   CHOL 115 08/07/2019 0921   TRIG 87 08/07/2019 0921   HDL 47 08/07/2019 0921   CHOLHDL 2.4 08/07/2019 0921   VLDL 17 08/07/2019 0921   LDLCALC 51 08/07/2019 0921      Wt Readings from Last 3 Encounters:  07/10/22 228 lb 4 oz (103.5 kg)  07/06/21 220 lb 2 oz (99.8 kg)  06/30/20 228 lb (103.4 kg)        06/24/2017    1:53 PM  PAD Screen  Previous PAD dx? No  Previous surgical procedure? No  Pain with walking? No  Feet/toe relief with dangling? No  Painful, non-healing ulcers? No  Extremities discolored? No      ASSESSMENT AND PLAN:  1.  Coronary artery disease involving native coronary arteries without angina: He is doing very well with no anginal symptoms. Cardiac exam is normal and baseline ECG is unremarkable. Continue medical therapy.  2. Hyperlipidemia: He did not tolerate statin therapy due to leg cramps.  He continues to take high-dose rosuvastatin but most recent lipid profile showed an LDL of 98.  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  I discussed with him the option of treatment with PCSK9 inhibitors but cost is a big issue at the present time does not have health insurance.  We discussed heart healthy diet and he is going to resume that.  He was able to get an LDL to 68 last year.  3. Essential hypertension: Blood pressure is well controlled.   Disposition:   FU with me in 1 year  Signed,  Kathlyn Sacramento, MD  07/10/2022 4:03 PM    [facility]

## 2022-12-27 ENCOUNTER — Encounter: Payer: Self-pay | Admitting: Family Medicine

## 2022-12-27 ENCOUNTER — Ambulatory Visit (INDEPENDENT_AMBULATORY_CARE_PROVIDER_SITE_OTHER): Payer: Self-pay

## 2022-12-27 ENCOUNTER — Ambulatory Visit (INDEPENDENT_AMBULATORY_CARE_PROVIDER_SITE_OTHER): Payer: Self-pay | Admitting: Family Medicine

## 2022-12-27 ENCOUNTER — Other Ambulatory Visit: Payer: Self-pay

## 2022-12-27 VITALS — BP 110/82 | HR 87 | Ht 72.0 in | Wt 231.8 lb

## 2022-12-27 DIAGNOSIS — G8929 Other chronic pain: Secondary | ICD-10-CM

## 2022-12-27 DIAGNOSIS — M25562 Pain in left knee: Secondary | ICD-10-CM

## 2022-12-27 NOTE — Progress Notes (Signed)
Shirlyn Goltz, PhD, LAT, ATC acting as a scribe for Lynne Leader, MD.  Subjective:    CC: L knee pain  HPI: Pt is a 57 y/o male c/o L knee pain x 8 weeks. Sx started around the time pt was installing a new sink in his home. Pt locates pain to lateral aspect of the knee and deep to patella. No visible swelling but the knee feels tight and stiff. Mechanical sx present with ambulation. Has been to see Chiro with no long-term relief. No meds for sx. Denies radicular sx.    L Knee swelling: yes Mechanical symptoms: yes Radiates: no Aggravates: flexion/extension Treatments tried: Chiro, time  Pertinent review of Systems: No fevers or chills  Relevant historical information: Hypothyroidism and hypertension   Objective:    Vitals:   12/27/22 1532  BP: 110/82  Pulse: 87  SpO2: 96%   General: Well Developed, well nourished, and in no acute distress.   MSK: Left knee: Mild swelling anterior knee.  Otherwise normal-appearing Normal knee motion. Tender palpation anterior knee and lateral knee joint line. Stable ligamentous exam.  Intact strength. Pain with resisted knee extension. Mildly positive lateral McMurray's test.  Lab and Radiology Results  Procedure: Real-time Ultrasound Guided Injection of left knee joint superior lateral patellar space Device: Philips Affiniti 50G Images permanently stored and available for review in PACS Ultrasound evaluation prior to injection reveals mild hypoechoic fluid tracking superficial to patellar tendon. Mild degeneration of the lateral meniscus. Verbal informed consent obtained.  Discussed risks and benefits of procedure. Warned about infection, bleeding, hyperglycemia damage to structures among others. Patient expresses understanding and agreement Time-out conducted.   Noted no overlying erythema, induration, or other signs of local infection.   Skin prepped in a sterile fashion.   Local anesthesia: Topical Ethyl chloride.    With sterile technique and under real time ultrasound guidance: 40 mg Kenalog 2 mL Marcaine injected into knee joint. Fluid seen entering the joint capsule.   Completed without difficulty   Pain immediately resolved suggesting accurate placement of the medication.   Advised to call if fevers/chills, erythema, induration, drainage, or persistent bleeding.   Images permanently stored and available for review in the ultrasound unit.  Impression: Technically successful ultrasound guided injection.    X-ray images left knee obtained today personally and independently interpreted Mild DJD.  No acute fractures. Await formal radiology review   Impression and Recommendations:    Assessment and Plan: 57 y.o. male with left knee pain thought to be due to patellofemoral pain syndrome and likely also a degenerative lateral meniscus tear or irritation.  There may be some exacerbation of DJD as well.  After discussion plan for trial of steroid injection. Recheck back again in 6 weeks.  PDMP not reviewed this encounter. Orders Placed This Encounter  Procedures   [place] LIMITED JOINT SPACE STRUCTURES LOW LEFT(NO LINKED CHARGES)    Order Specific Question:   Reason for Exam (SYMPTOM  OR DIAGNOSIS REQUIRED)    Answer:   left knee pain    Order Specific Question:   Preferred imaging location?    Answer:   [facility]   DG Knee AP/LAT W/Sunrise Left    Standing Status:   Future    Number of Occurrences:   1    Standing Expiration Date:   01/27/2023    Order Specific Question:   Reason for Exam (SYMPTOM  OR DIAGNOSIS REQUIRED)    Answer:   left knee pain  Order Specific Question:   Preferred imaging location?    Answer:   [facility]   No orders of the defined types were placed in this encounter.   Discussed warning signs or symptoms. Please see discharge instructions. Patient expresses understanding.   The above documentation has been reviewed and is accurate  and complete Lynne Leader, M.D.

## 2022-12-27 NOTE — Patient Instructions (Addendum)
Thank you for coming in today.   You received an injection today. Seek immediate medical attention if the joint becomes red, extremely painful, or is oozing fluid.   Please get an Xray today before you leave   Please use Voltaren gel (Generic Diclofenac Gel) up to 4x daily for pain as needed.  This is available over-the-counter as both the name brand Voltaren gel and the generic diclofenac gel.   Check back in 6 weeks 

## 2023-01-07 NOTE — Progress Notes (Signed)
Left knee x-ray looks normal to radiology

## 2023-02-07 ENCOUNTER — Ambulatory Visit: Payer: Self-pay | Admitting: Family Medicine

## 2023-07-11 ENCOUNTER — Ambulatory Visit: Payer: Self-pay | Admitting: Cardiovascular Disease

## 2023-07-20 NOTE — Progress Notes (Unsigned)
Cardiology Office Note    Date:  07/23/2023   ID:  SHINE MIKES, DOB 04-17-1966, MRN [id number]  PCP:  Kandyce Rud, MD  Cardiologist:  Lorine Bears, MD  Electrophysiologist:  None   Chief Complaint: Follow up  History of Present Illness:   Chase Jensen is a 57 y.o. male with history of CAD status post balloon angioplasty without stent in 2006 in [place], [place], HTN, severe hyperlipidemia, and family history of premature CAD with his father having CABG at age 72 who presents for follow-up of his CAD.  Prior cardiology care in [place], [place], and [place], [place].  He underwent balloon angioplasty without stent placement in 2006 in [place], [place].  At that time, no stent was placed due to bifurcation location.  He is a non-smoker.  He was last seen in the office in 07/2022 and was without symptoms of angina or cardiac decompensation.  He comes in today and is without symptoms of angina or cardiac decompensation.  No palpitations, dizziness, presyncope, or syncope.  He does note significant lower extremity nocturnal cramping that feels similar to what he experienced while on ezetimibe.  He also notes a 6 to 67-month history of lower extremity swelling that is more progressive throughout the day and improved early in the morning.  Sits at a desk for large portions of the day.  No shortness of breath, orthopnea, abdominal distention, or early satiety.  He does watch his sodium intake.  His weight is up 6 pounds today when compared to his visit in 07/2022.   Labs independently reviewed: 11/2022 - Hgb 15.9, PLT 215, potassium 4.6, BUN 18, serum creatinine 1.2, albumin 4.3, AST/ALT normal, TC 168, TG 129, HDL 52, LDL 90, TSH normal  Past Medical History:  Diagnosis Date   Coronary artery disease 2006    balloon angioplasty without stent placement to one-vessel done at [facility] in [facility]    Hyperlipemia    Hypertension    Hypothyroidism      Past Surgical History:  Procedure Laterality Date   CARDIAC SURGERY     CHOLECYSTECTOMY     COLONOSCOPY WITH PROPOFOL N/A 07/29/2015   Procedure: COLONOSCOPY WITH PROPOFOL;  Surgeon: Elnita Maxwell, MD;  Location: [facility] ENDOSCOPY;  Service: Endoscopy;  Laterality: N/A;   CORONARY ANGIOPLASTY     HERNIA REPAIR     NASAL SINUS SURGERY     PILONIDAL CYST DRAINAGE     TONSILLECTOMY      Current Medications: Current Meds  Medication Sig   Ascorbic Acid (VITAMIN C PO) Take by mouth daily.   aspirin 81 MG EC tablet Take 81 mg by mouth daily. Swallow whole.   [START ON 08/05/2023] atorvastatin (LIPITOR) 40 MG tablet Take 1 tablet (40 mg total) by mouth daily. Please start on 08/06/2023.   fluticasone (FLONASE) 50 MCG/ACT nasal spray Place into the nose as needed.   levothyroxine (SYNTHROID) 137 MCG tablet Take by mouth.   lisinopril (PRINIVIL,ZESTRIL) 10 MG tablet Take 5 mg by mouth.    multivitamin-lutein (OCUVITE-LUTEIN) CAPS capsule Take 1 capsule by mouth daily.   SUMAtriptan (IMITREX) 100 MG tablet Take by mouth.   [DISCONTINUED] rosuvastatin (CRESTOR) 40 MG tablet Take 40 mg by mouth daily.    Allergies:   Zetia [ezetimibe], Penicillins, and Propoxyphene   Social History   Socioeconomic History   Marital status: Married    Spouse name: Not on file   Number of children: Not on file   Years of  education: Not on file   Highest education level: Not on file  Occupational History   Not on file  Tobacco Use   Smoking status: Never   Smokeless tobacco: Never  Substance and Sexual Activity   Alcohol use: Not on file   Drug use: Not on file   Sexual activity: Not on file  Other Topics Concern   Not on file  Social History Narrative   Not on file   Social Determinants of Health   Financial Resource Strain: Patient Declined (12/11/2022)   Received from [facility], [facility]   Overall Financial Resource Strain (CARDIA)     Difficulty of Paying Living Expenses: Patient declined  Food Insecurity: Patient Declined (12/11/2022)   Received from [facility], [facility]   Hunger Vital Sign    Worried About Running Out of Food in the Last Year: Patient declined    Ran Out of Food in the Last Year: Patient declined  Transportation Needs: Patient Declined (12/11/2022)   Received from [facility], [facility]   PRAPARE - Transportation    In the past 12 months, has lack of transportation kept you from medical appointments or from getting medications?: Patient declined    Lack of Transportation (Non-Medical): Patient declined  Physical Activity: Not on file  Stress: Not on file  Social Connections: Not on file     Family History:  The patient's family history is not on file.  ROS:   12-point review of systems is negative unless otherwise noted in the HPI.   EKGs/Labs/Other Studies Reviewed:    Studies reviewed were summarized above. The additional studies were reviewed today:  None available for review  EKG:  EKG is ordered today.  The EKG ordered today demonstrates NSR, 64 bpm, no acute ST-T changes  Recent Labs: No results found for requested labs within last 365 days.  Recent Lipid Panel    Component Value Date/Time   CHOL 115 08/07/2019 0921   TRIG 87 08/07/2019 0921   HDL 47 08/07/2019 0921   CHOLHDL 2.4 08/07/2019 0921   VLDL 17 08/07/2019 0921   LDLCALC 51 08/07/2019 0921    PHYSICAL EXAM:    VS:  BP 119/80 (BP Location: Left Arm, Patient Position: Sitting, Cuff Size: Normal)   Pulse 64   Ht 6' (1.829 m)   Wt 234 lb (106.1 kg)   SpO2 98%   BMI 31.74 kg/m   BMI: Body mass index is 31.74 kg/m.  Physical Exam Vitals reviewed.  Constitutional:      Appearance: He is well-developed.  HENT:     Head: Normocephalic and atraumatic.  Eyes:     General:        Right eye: No discharge.        Left eye: No discharge.   Neck:     Vascular: No JVD.  Cardiovascular:     Rate and Rhythm: Normal rate and regular rhythm.     Heart sounds: Normal heart sounds, S1 normal and S2 normal. Heart sounds not distant. No midsystolic click and no opening snap. No murmur heard.    No friction rub.  Pulmonary:     Effort: Pulmonary effort is normal. No respiratory distress.     Breath sounds: Normal breath sounds. No decreased breath sounds, wheezing, rhonchi or rales.  Chest:     Chest wall: No tenderness.  Abdominal:     General: There is no distension.  Musculoskeletal:     Cervical back: Normal range of motion.     Comments: Mild bilateral pretibial edema.  Skin:    General: Skin is warm and dry.     Nails: There is no clubbing.  Neurological:     Mental Status: He is alert and oriented to person, place, and time.  Psychiatric:        Speech: Speech normal.        Behavior: Behavior normal.        Thought Content: Thought content normal.        Judgment: Judgment normal.     Wt Readings from Last 3 Encounters:  07/23/23 234 lb (106.1 kg)  12/27/22 231 lb 12.8 oz (105.1 kg)  07/10/22 228 lb 4 oz (103.5 kg)     ASSESSMENT & PLAN:   CAD involving the native coronary arteries without angina: He is doing well and without symptoms concerning for angina or cardiac decompensation.  Continue aggressive risk factor modification and secondary prevention including aspirin and lisinopril.  We are transitioning from rosuvastatin to atorvastatin due to cramping as outlined below.  Obtain echo with lower extremity swelling.  HTN: Blood pressure is well-controlled in the office.  Remains on lisinopril 5 mg.  HLD with statin intolerance: LDL 98 in 11/2022.  Reports significant lower extremity cramping similar to what he has previously experienced with ezetimibe leading to discontinuation.  Query if this is related to rosuvastatin.  We will undergo a trial of discontinuing rosuvastatin for 2 weeks followed by initiation  of atorvastatin 40 mg daily.  He reports while living in [place] he was on atorvastatin and tolerated the medication without issues.  If he tolerates atorvastatin, recommend fasting lipid panel and LFT be obtained in approximately 3 months with recommendation to escalate lipid-lowering therapy as able/indicated.  Unable to pursue PCSK9 inhibitor, bempedoic acid, or inclisiran at this time due to lack of health insurance and financial constraints.  Are healthy diet recommended.  Lower extremity swelling: Appears to be consistent with venous insufficiency.  Obtain echo to evaluate for new cardiomyopathy.  Check CMP and CBC.  Recommend leg elevation and knee-high compression socks.  Would avoid loop diuretic in an effort to preserve renal function and minimize dehydration.    Disposition: F/u with Dr. Kirke Corin or an APP in 4 months.   Medication Adjustments/Labs and Tests Ordered: Current medicines are reviewed at length with the patient today.  Concerns regarding medicines are outlined above. Medication changes, Labs and Tests ordered today are summarized above and listed in the Patient Instructions accessible in Encounters.   Signed, Eula Listen, PA-C 07/23/2023 10:23 AM     [facility] [address] [place], [place] [address] [phone]

## 2023-07-23 ENCOUNTER — Encounter: Payer: Self-pay | Admitting: Physician Assistant

## 2023-07-23 ENCOUNTER — Ambulatory Visit: Payer: Self-pay | Attending: Physician Assistant | Admitting: Physician Assistant

## 2023-07-23 VITALS — BP 119/80 | HR 64 | Ht 72.0 in | Wt 234.0 lb

## 2023-07-23 DIAGNOSIS — I10 Essential (primary) hypertension: Secondary | ICD-10-CM

## 2023-07-23 DIAGNOSIS — Z789 Other specified health status: Secondary | ICD-10-CM

## 2023-07-23 DIAGNOSIS — I2510 Atherosclerotic heart disease of native coronary artery without angina pectoris: Secondary | ICD-10-CM

## 2023-07-23 DIAGNOSIS — M7989 Other specified soft tissue disorders: Secondary | ICD-10-CM

## 2023-07-23 DIAGNOSIS — E785 Hyperlipidemia, unspecified: Secondary | ICD-10-CM

## 2023-07-23 MED ORDER — ATORVASTATIN CALCIUM 40 MG PO TABS: 40.0000 mg | ORAL_TABLET | Freq: Every day | ORAL | 3 refills | Status: DC

## 2023-07-23 NOTE — Patient Instructions (Signed)
Medication Instructions:  Your physician recommends the following medication changes.  STOP TAKING: Crestor  START TAKING: Lipitor 40 mg daily in TWO WEEKS  *If you need a refill on your cardiac medications before your next appointment, please call your pharmacy*   Lab Work: Your provider would like for you to have following labs drawn today CMET and CBC.   If you have labs (blood work) drawn today and your tests are completely normal, you will receive your results only by: MyChart Message (if you have MyChart) OR A paper copy in the mail If you have any lab test that is abnormal or we need to change your treatment, we will call you to review the results.   Testing/Procedures: Your physician has requested that you have an echocardiogram. Echocardiography is a painless test that uses sound waves to create images of your heart. It provides your doctor with information about the size and shape of your heart and how well your heart's chambers and valves are working.   You may receive an ultrasound enhancing agent through an IV if needed to better visualize your heart during the echo. This procedure takes approximately one hour.  There are no restrictions for this procedure.  This will take place at [address], [place] [address]    Follow-Up: At [facility], you and your health needs are our priority.  As part of our continuing mission to provide you with exceptional heart care, we have created designated Provider Care Teams.  These Care Teams include your primary Cardiologist (physician) and Advanced Practice Providers (APPs -  Physician Assistants and Nurse Practitioners) who all work together to provide you with the care you need, when you need it.  We recommend signing up for the patient portal called "MyChart".  Sign up information is provided on this After Visit Summary.  MyChart is used to connect with patients for Virtual Visits  (Telemedicine).  Patients are able to view lab/test results, encounter notes, upcoming appointments, etc.  Non-urgent messages can be sent to your provider as well.   To learn more about what you can do with MyChart, go to [email or web address].    Your next appointment:   4 month(s)  Provider:   You may see Lorine Bears, MD or one of the following Advanced Practice Providers on your designated Care Team:   Eula Listen, [place]

## 2023-07-24 LAB — COMPREHENSIVE METABOLIC PANEL
ALT: 25 [IU]/L (ref 0–44)
AST: 25 [IU]/L (ref 0–40)
Albumin: 3.7 g/dL — ABNORMAL LOW (ref 3.8–4.9)
Alkaline Phosphatase: 55 [IU]/L (ref 44–121)
BUN/Creatinine Ratio: 14 (ref 9–20)
BUN: 15 mg/dL (ref 6–24)
Bilirubin Total: 0.3 mg/dL (ref 0.0–1.2)
CO2: 25 mmol/L (ref 20–29)
Calcium: 9.1 mg/dL (ref 8.7–10.2)
Chloride: 108 mmol/L — ABNORMAL HIGH (ref 96–106)
Creatinine, Ser: 1.06 mg/dL (ref 0.76–1.27)
Globulin, Total: 1.9 g/dL (ref 1.5–4.5)
Glucose: 93 mg/dL (ref 70–99)
Potassium: 4.6 mmol/L (ref 3.5–5.2)
Sodium: 144 mmol/L (ref 134–144)
Total Protein: 5.6 g/dL — ABNORMAL LOW (ref 6.0–8.5)
eGFR: 81 mL/min/{1.73_m2} (ref >59)

## 2023-07-24 LAB — CBC
Hematocrit: 50.8 % (ref 37.5–51.0)
Hemoglobin: 16.3 g/dL (ref 13.0–17.7)
MCH: 28.8 pg (ref 26.6–33.0)
MCHC: 32.1 g/dL (ref 31.5–35.7)
MCV: 90 fL (ref 79–97)
Platelets: 221 10*3/uL (ref 150–450)
RBC: 5.65 x10E6/uL (ref 4.14–5.80)
RDW: 12.9 % (ref 11.6–15.4)
WBC: 4.9 10*3/uL (ref 3.4–10.8)

## 2023-08-12 ENCOUNTER — Ambulatory Visit: Payer: Self-pay | Attending: Physician Assistant

## 2023-08-12 DIAGNOSIS — I2510 Atherosclerotic heart disease of native coronary artery without angina pectoris: Secondary | ICD-10-CM

## 2023-08-12 LAB — ECHOCARDIOGRAM COMPLETE
Area-P 1/2: 3.77 cm2
S' Lateral: 3.2 cm

## 2023-08-13 ENCOUNTER — Other Ambulatory Visit: Payer: Self-pay | Admitting: Emergency Medicine

## 2023-08-13 DIAGNOSIS — M7989 Other specified soft tissue disorders: Secondary | ICD-10-CM

## 2023-08-13 DIAGNOSIS — I77810 Thoracic aortic ectasia: Secondary | ICD-10-CM

## 2023-08-18 ENCOUNTER — Encounter: Payer: Self-pay | Admitting: Cardiovascular Disease

## 2023-11-20 ENCOUNTER — Other Ambulatory Visit: Payer: Self-pay | Admitting: Emergency Medicine

## 2023-11-20 ENCOUNTER — Encounter: Payer: Self-pay | Admitting: Family Medicine

## 2023-11-20 ENCOUNTER — Other Ambulatory Visit: Payer: Self-pay

## 2023-11-20 ENCOUNTER — Ambulatory Visit: Payer: Self-pay | Admitting: Family Medicine

## 2023-11-20 VITALS — BP 110/72 | HR 63 | Ht 72.0 in | Wt 218.0 lb

## 2023-11-20 DIAGNOSIS — E7800 Pure hypercholesterolemia, unspecified: Secondary | ICD-10-CM

## 2023-11-20 DIAGNOSIS — G8929 Other chronic pain: Secondary | ICD-10-CM

## 2023-11-20 DIAGNOSIS — M25562 Pain in left knee: Secondary | ICD-10-CM

## 2023-11-20 DIAGNOSIS — Z79899 Other long term (current) drug therapy: Secondary | ICD-10-CM

## 2023-11-20 DIAGNOSIS — E785 Hyperlipidemia, unspecified: Secondary | ICD-10-CM

## 2023-11-20 NOTE — Progress Notes (Signed)
   I, Chase Jensen, CMA acting as a scribe for Chase Graham, MD.  Chase Jensen is a 58 y.o. male who presents to [facility] today for exacerbation of his L knee pain. Pt was last seen by Dr. Denyse Jensen on 12/27/22 and was given a L knee steroid injection.  Today, pt reports significant improved of knee sx s/p injection. Sx started to return around Sept 2024, progressively worsening. Compression has been helpful. Sx worse with stairs and extension. Has been taking IBU and using Voltaren Gel. Denies visible swelling. Some popping in the knee. Occasionally pain will radiate into the toes.  Some lower back pain, see's Chiro in [place].   Dx imaging: 12/27/22 L knee XR  Pertinent review of systems: No fevers or chills  Relevant historical information: Hypertension   Exam:  BP 110/72   Pulse 63   Ht 6' (1.829 m)   Wt 218 lb (98.9 kg)   SpO2 98%   BMI 29.57 kg/m  General: Well Developed, well nourished, and in no acute distress.   MSK: Left knee no significant joint effusion normal-appearing otherwise. Normal motion. Tender palpation lateral joint line.  Unable to reproduce paresthesias with pressure over the common fibular nerve. Stable ligamentous exam. Intact strength. Positive lateral McMurray's test.    Lab and Radiology Results  Procedure: Real-time Ultrasound Guided Injection of left knee joint superior lateral patella space Device: Philips Affiniti 50G/GE Logiq Images permanently stored and available for review in PACS .  Ultrasound evaluation prior to injection reveals significant degeneration of the lateral meniscus with partial extrusion. Verbal informed consent obtained.  Discussed risks and benefits of procedure. Warned about infection, bleeding, hyperglycemia damage to structures among others. Patient expresses understanding and agreement Time-out conducted.   Noted no overlying erythema, induration, or other signs of local infection.    Skin prepped in a sterile fashion.   Local anesthesia: Topical Ethyl chloride.   With sterile technique and under real time ultrasound guidance: 40 mg of Kenalog and 2 mL of Marcaine injected into knee joint. Fluid seen entering the joint capsule.   Completed without difficulty   Pain immediately resolved suggesting accurate placement of the medication.   Advised to call if fevers/chills, erythema, induration, drainage, or persistent bleeding.   Images permanently stored and available for review in the ultrasound unit.  Impression: Technically successful ultrasound guided injection.       Assessment and Plan: 58 y.o. male with chronic left knee pain with acute exacerbation.  X-ray obtained March 2024 did not show any arthritis.  Ultrasound today shows significant meniscus degeneration and partial extrusion of the lateral meniscus.  It is possible he is having exacerbation of that.  Plan for intra-articular steroid injection.  If unable to resume normal activity or if pain continues especially with mechanical symptoms next step should be MRI.   PDMP not reviewed this encounter. Orders Placed This Encounter  Procedures   [place] LIMITED JOINT SPACE STRUCTURES LOW LEFT(NO LINKED CHARGES)    Reason for Exam (SYMPTOM  OR DIAGNOSIS REQUIRED):   left knee pain    Preferred imaging location?:    [facility]   No orders of the defined types were placed in this encounter.    Discussed warning signs or symptoms. Please see discharge instructions. Patient expresses understanding.   The above documentation has been reviewed and is accurate and complete Chase Jensen, M.D.

## 2023-11-20 NOTE — Patient Instructions (Signed)
Thank you for coming in today.   You received an injection today. Seek immediate medical attention if the joint becomes red, extremely painful, or is oozing fluid.   Please use Voltaren gel (Generic Diclofenac Gel) up to 4x daily for pain as needed.  This is available over-the-counter as both the name brand Voltaren gel and the generic diclofenac gel.   I recommend you obtained a compression sleeve to help with your joint problems. There are many options on the market however I recommend obtaining a Full Knee Body Helix compression sleeve.  You can find information (including how to appropriate measure yourself for sizing) can be found at www.Body Grand[employer]ap[employer].ch.  Many of these products are health savings account (HSA) eligible.  You can use the compression sleeve at any time throughout the day but is most important to use while being active as well as for 2 hours post-activity.   It is appropriate to ice following activity with the compression sleeve in place.   If not better let me know, and I will order a MRI

## 2023-11-21 NOTE — Progress Notes (Deleted)
 Cardiology Office Note    Date:  11/21/2023   ID:  Chase Jensen, DOB 11/02/1965, MRN [id number]  PCP:  Kandyce Rud, MD  Cardiologist:  Lorine Bears, MD  Electrophysiologist:  None   Chief Complaint: Follow up  History of Present Illness:   Chase Jensen is a 58 y.o. male with history of CAD status post balloon angioplasty without stent in 2006 in [place], [place], HTN, severe hyperlipidemia, and family history of premature CAD with his father having CABG at age 66 who presents for follow-up of his CAD.   Prior cardiology care in [place], [place], and [place], [place].  He underwent balloon angioplasty without stent placement in 2006 in [place], [place].  At that time, no stent was placed due to bifurcation location.  He is a non-smoker.  He was last seen in the office in 07/2023 noting his lower extremity nocturnal cramping that felt similar to what he experienced while on ezetimibe.  He also noted a 52-month history of lower extremity swelling that was progressive throughout the day and improved early in the morning.  Was without symptoms of shortness of breath or orthopnea.  Echo in 08/2023 showed an EF of 60 to 65%, no regional wall motion abnormalities, normal LV diastolic function parameters, normal RV systolic function and ventricular cavity size, tricuspid aortic valve, and mild dilatation of the ascending aorta measuring 39 mm.  ***   Labs independently reviewed: *** 11/2022 - TSH normal  Past Medical History:  Diagnosis Date   Coronary artery disease 2006    balloon angioplasty without stent placement to one-vessel done at [facility] in [facility]    Hyperlipemia    Hypertension    Hypothyroidism     Past Surgical History:  Procedure Laterality Date   CARDIAC SURGERY     CHOLECYSTECTOMY     COLONOSCOPY WITH PROPOFOL N/A 07/29/2015   Procedure: COLONOSCOPY WITH PROPOFOL;  Surgeon: Elnita Maxwell, MD;  Location: [facility]  ENDOSCOPY;  Service: Endoscopy;  Laterality: N/A;   CORONARY ANGIOPLASTY     HERNIA REPAIR     NASAL SINUS SURGERY     PILONIDAL CYST DRAINAGE     TONSILLECTOMY      Current Medications: No outpatient medications have been marked as taking for the 11/22/23 encounter (Appointment) with Sondra Barges, PA-C.    Allergies:   Zetia [ezetimibe], Penicillins, and Propoxyphene   Social History   Socioeconomic History   Marital status: Married    Spouse name: Not on file   Number of children: Not on file   Years of education: Not on file   Highest education level: Not on file  Occupational History   Not on file  Tobacco Use   Smoking status: Never   Smokeless tobacco: Never  Substance and Sexual Activity   Alcohol use: Not on file   Drug use: Not on file   Sexual activity: Not on file  Other Topics Concern   Not on file  Social History Narrative   Not on file   Social Drivers of Health   Financial Resource Strain: Low Risk  (08/29/2023)   Received from [facility]   Overall Financial Resource Strain (CARDIA)    Difficulty of Paying Living Expenses: Not very hard  Food Insecurity: No Food Insecurity (08/29/2023)   Received from [facility]   Hunger Vital Sign    Worried About Running Out of Food in the Last Year: Never true  Ran Out of Food in the Last Year: Never true  Transportation Needs: No Transportation Needs (08/29/2023)   Received from [facility]    In the past 12 months, has lack of transportation kept you from medical appointments or from getting medications?: No    Lack of Transportation (Non-Medical): No  Physical Activity: Not on file  Stress: Not on file  Social Connections: Not on file     Family History:  The patient's family history is not on file.  ROS:   12-point review of systems is negative unless otherwise noted in the HPI.   EKGs/Labs/Other Studies Reviewed:     Studies reviewed were summarized above. The additional studies were reviewed today:  2D echo 08/12/2023: 1. Left ventricular ejection fraction, by estimation, is 60 to 65%. The  left ventricle has normal function. The left ventricle has no regional  wall motion abnormalities. Left ventricular diastolic parameters were  normal. The average left ventricular  global longitudinal strain is -16.0 %. The global longitudinal strain is  normal.   2. Right ventricular systolic function is normal. The right ventricular  size is normal.   3. The mitral valve is normal in structure. No evidence of mitral valve  regurgitation.   4. The aortic valve is tricuspid. Aortic valve regurgitation is not  visualized.   5. Aortic dilatation noted. There is mild dilatation of the ascending  aorta, measuring 39 mm.   6. The inferior vena cava is normal in size with greater than 50%  respiratory variability, suggesting right atrial pressure of 3 mmHg.    EKG:  EKG is ordered today.  The EKG ordered today demonstrates ***  Recent Labs: 07/23/2023: ALT 25; BUN 15; Creatinine, Ser 1.06; Hemoglobin 16.3; Platelets 221; Potassium 4.6; Sodium 144  Recent Lipid Panel    Component Value Date/Time   CHOL 115 08/07/2019 0921   TRIG 87 08/07/2019 0921   HDL 47 08/07/2019 0921   CHOLHDL 2.4 08/07/2019 0921   VLDL 17 08/07/2019 0921   LDLCALC 51 08/07/2019 0921    PHYSICAL EXAM:    VS:  There were no vitals taken for this visit.  BMI: There is no height or weight on file to calculate BMI.  Physical Exam  Wt Readings from Last 3 Encounters:  11/20/23 218 lb (98.9 kg)  07/23/23 234 lb (106.1 kg)  12/27/22 231 lb 12.8 oz (105.1 kg)     ASSESSMENT & PLAN:   CAD involving the native coronary arteries without angina:   HTN: Blood pressure   HLD with statin intolerance: LDL   Lower extremity swelling:    {Are you ordering a CV Procedure (e.g. stress test, cath, DCCV, TEE, etc)?   Press F2         :[id number]}     Disposition: F/u with Dr. Kirke Corin or an APP in ***.   Medication Adjustments/Labs and Tests Ordered: Current medicines are reviewed at length with the patient today.  Concerns regarding medicines are outlined above. Medication changes, Labs and Tests ordered today are summarized above and listed in the Patient Instructions accessible in Encounters.   Signed, Eula Listen, PA-C 11/21/2023 7:38 AM     [facility] [place] [address] [place], [place] [address] [phone]

## 2023-11-22 ENCOUNTER — Ambulatory Visit: Payer: Self-pay | Admitting: Physician Assistant

## 2023-11-22 LAB — LIPID PANEL
Chol/HDL Ratio: 3.2 {ratio} (ref 0.0–5.0)
Cholesterol, Total: 153 mg/dL (ref 100–199)
HDL: 48 mg/dL (ref >39)
LDL Chol Calc (NIH): 85 mg/dL (ref 0–99)
Triglycerides: 107 mg/dL (ref 0–149)
VLDL Cholesterol Cal: 20 mg/dL (ref 5–40)

## 2023-11-22 LAB — COMPREHENSIVE METABOLIC PANEL
ALT: 30 [IU]/L (ref 0–44)
AST: 39 [IU]/L (ref 0–40)
Albumin: 4 g/dL (ref 3.8–4.9)
Alkaline Phosphatase: 69 [IU]/L (ref 44–121)
BUN/Creatinine Ratio: 13 (ref 9–20)
BUN: 14 mg/dL (ref 6–24)
Bilirubin Total: 0.5 mg/dL (ref 0.0–1.2)
CO2: 22 mmol/L (ref 20–29)
Calcium: 9.6 mg/dL (ref 8.7–10.2)
Chloride: 105 mmol/L (ref 96–106)
Creatinine, Ser: 1.04 mg/dL (ref 0.76–1.27)
Globulin, Total: 1.9 g/dL (ref 1.5–4.5)
Glucose: 104 mg/dL — ABNORMAL HIGH (ref 70–99)
Potassium: 4.5 mmol/L (ref 3.5–5.2)
Sodium: 141 mmol/L (ref 134–144)
Total Protein: 5.9 g/dL — ABNORMAL LOW (ref 6.0–8.5)
eGFR: 83 mL/min/{1.73_m2} (ref >59)

## 2023-11-22 LAB — CBC
Hematocrit: 46 % (ref 37.5–51.0)
Hemoglobin: 15 g/dL (ref 13.0–17.7)
MCH: 29.8 pg (ref 26.6–33.0)
MCHC: 32.6 g/dL (ref 31.5–35.7)
MCV: 92 fL (ref 79–97)
Platelets: 271 10*3/uL (ref 150–450)
RBC: 5.03 x10E6/uL (ref 4.14–5.80)
RDW: 13.1 % (ref 11.6–15.4)
WBC: 10.1 10*3/uL (ref 3.4–10.8)

## 2023-11-29 ENCOUNTER — Other Ambulatory Visit: Payer: Self-pay | Admitting: Emergency Medicine

## 2023-11-29 DIAGNOSIS — Z79899 Other long term (current) drug therapy: Secondary | ICD-10-CM

## 2023-11-29 MED ORDER — ATORVASTATIN CALCIUM 80 MG PO TABS: 80.0000 mg | ORAL_TABLET | Freq: Every day | ORAL | 3 refills | Status: DC

## 2023-12-02 NOTE — Progress Notes (Unsigned)
 Cardiology Office Note    Date:  12/03/2023   ID:  Chase Jensen, Chase Jensen [birth year], MRN [id number]  PCP:  Kandyce Rud, MD  Cardiologist:  Lorine Bears, MD  Electrophysiologist:  None   Chief Complaint: Follow up  History of Present Illness:   Chase Jensen is a 58 y.o. male with history of CAD status post balloon angioplasty without stent in 2006 in [place], [place], dilated ascending thoracic aorta, HTN, severe hyperlipidemia, and family history of premature CAD with his father having CABG at age 7 who presents for follow-up of his CAD.   Prior cardiology care in [place], [place], and [place], [place].  He underwent balloon angioplasty without stent placement in 2006 in [place], [place].  At that time, no stent was placed due to bifurcation location.  He is a non-smoker.  He was last seen in the office in 07/2023 noting his lower extremity nocturnal cramping that felt similar to what he experienced while on ezetimibe.  He also noted lower extremity swelling that was progressive throughout the day and improved early in the morning.  He was without symptoms of shortness of breath or orthopnea.  Echo in 08/2023 showed an EF of 60 to 65%, no regional wall motion abnormalities, normal LV diastolic function parameters, normal RV systolic function and ventricular cavity size, tricuspid aortic valve, and mild dilatation of the ascending aorta measuring 39 mm.  He comes in doing overall well from a cardiac perspective.  However, he was recently walking through a trail up and down some miles and developed substernal chest pressure that improved with rest.  No associated symptoms.  He noted he felt significantly better after belching.  He was subsequently able to workout at the gym with some friends thereafter without any further symptoms.  He has been symptom-free since.  No dizziness, presyncope, or syncope.  No falls or symptoms concerning for bleeding.  With lifestyle  modification he has noticed improvement in weight with his weight being down 21 pounds today when compared to his last visit in our office in 07/2023.  This is intentional.  Currently without symptoms of angina or cardiac decompensation.   Labs independently reviewed: 11/2023 - Hgb 15.0, PLT 271, BUN 14, 0-1.04, potassium 4.5, albumin 4.0, AST/ALT normal, TC 153, TG 107, HDL 48, LDL 85 11/2022 - TSH normal  Past Medical History:  Diagnosis Date   Coronary artery disease 2006    balloon angioplasty without stent placement to one-vessel done at [facility] in [facility]    Hyperlipemia    Hypertension    Hypothyroidism     Past Surgical History:  Procedure Laterality Date   CARDIAC SURGERY     CHOLECYSTECTOMY     COLONOSCOPY WITH PROPOFOL N/A 07/29/2015   Procedure: COLONOSCOPY WITH PROPOFOL;  Surgeon: Elnita Maxwell, MD;  Location: [facility] ENDOSCOPY;  Service: Endoscopy;  Laterality: N/A;   CORONARY ANGIOPLASTY     HERNIA REPAIR     NASAL SINUS SURGERY     PILONIDAL CYST DRAINAGE     TONSILLECTOMY      Current Medications: Current Meds  Medication Sig   Ascorbic Acid (VITAMIN C PO) Take by mouth daily.   aspirin 81 MG EC tablet Take 81 mg by mouth daily. Swallow whole.   atorvastatin (LIPITOR) 80 MG tablet Take 1 tablet (80 mg total) by mouth daily.   fluticasone (FLONASE) 50 MCG/ACT nasal spray Place into the nose as needed.   ivabradine (CORLANOR) 5 MG TABS tablet Take  3 tablets (15 mg total) by mouth once for 1 dose. Take approximately 2 hours prior to CTA   levothyroxine (SYNTHROID) 137 MCG tablet Take by mouth.   lisinopril (PRINIVIL,ZESTRIL) 10 MG tablet Take 5 mg by mouth.    multivitamin-lutein (OCUVITE-LUTEIN) CAPS capsule Take 1 capsule by mouth daily.   SUMAtriptan (IMITREX) 100 MG tablet Take by mouth.    Allergies:   Zetia [ezetimibe], Penicillins, and Propoxyphene   Social History   Socioeconomic History   Marital status: Married    Spouse  name: Not on file   Number of children: Not on file   Years of education: Not on file   Highest education level: Not on file  Occupational History   Not on file  Tobacco Use   Smoking status: Never   Smokeless tobacco: Never  Substance and Sexual Activity   Alcohol use: Not on file   Drug use: Not on file   Sexual activity: Not on file  Other Topics Concern   Not on file  Social History Narrative   Not on file   Social Drivers of Health   Financial Resource Strain: Low Risk  (08/29/2023)   Received from [facility]   Overall Financial Resource Strain (CARDIA)    Difficulty of Paying Living Expenses: Not very hard  Food Insecurity: No Food Insecurity (08/29/2023)   [facility]   Hunger Vital Sign    Worried About Running Out of Food in the Last Year: Never true    Ran Out of Food in the Last Year: Never true  Transportation Needs: No Transportation Needs (08/29/2023)   Received from [facility]    In the past 12 months, has lack of transportation kept you from medical appointments or from getting medications?: No    Lack of Transportation (Non-Medical): No  Physical Activity: Not on file  Stress: Not on file  Social Connections: Not on file     Family History:  The patient's family history is not on file.  ROS:   12-point review of systems is negative unless otherwise noted in the HPI.   EKGs/Labs/Other Studies Reviewed:    Studies reviewed were summarized above. The additional studies were reviewed today:  2D echo 08/12/2023: 1. Left ventricular ejection fraction, by estimation, is 60 to 65%. The  left ventricle has normal function. The left ventricle has no regional  wall motion abnormalities. Left ventricular diastolic parameters were  normal. The average left ventricular  global longitudinal strain is -16.0 %. The global longitudinal strain is  normal.   2. Right  ventricular systolic function is normal. The right ventricular  size is normal.   3. The mitral valve is normal in structure. No evidence of mitral valve  regurgitation.   4. The aortic valve is tricuspid. Aortic valve regurgitation is not  visualized.   5. Aortic dilatation noted. There is mild dilatation of the ascending  aorta, measuring 39 mm.   6. The inferior vena cava is normal in size with greater than 50%  respiratory variability, suggesting right atrial pressure of 3 mmHg.    EKG:  EKG is ordered today.  The EKG ordered today demonstrates NSR, 66 bpm, occasional isolated PVCs, no acute ST-T changes  Recent Labs: 11/21/2023: ALT 30; BUN 14; Creatinine, Ser 1.04; Hemoglobin 15.0; Platelets 271; Potassium 4.5; Sodium 141  Recent Lipid Panel    Component Value Date/Time   CHOL 153 11/21/2023 0820  TRIG 107 11/21/2023 0820   HDL 48 11/21/2023 0820   CHOLHDL 3.2 11/21/2023 0820   CHOLHDL 2.4 08/07/2019 0921   VLDL 17 08/07/2019 0921   LDLCALC 85 11/21/2023 0820    PHYSICAL EXAM:    VS:  BP 100/70 (BP Location: Left Arm, Patient Position: Sitting, Cuff Size: Normal)   Pulse 66   Ht 6' (1.829 m)   Wt 213 lb (96.6 kg)   BMI 28.89 kg/m   BMI: Body mass index is 28.89 kg/m.  Physical Exam Vitals reviewed.  Constitutional:      Appearance: He is well-developed.  HENT:     Head: Normocephalic and atraumatic.  Eyes:     General:        Right eye: No discharge.        Left eye: No discharge.  Cardiovascular:     Rate and Rhythm: Normal rate and regular rhythm.     Heart sounds: Normal heart sounds, S1 normal and S2 normal. Heart sounds not distant. No midsystolic click and no opening snap. No murmur heard.    No friction rub.  Pulmonary:     Effort: Pulmonary effort is normal. No respiratory distress.     Breath sounds: Normal breath sounds. No decreased breath sounds, wheezing, rhonchi or rales.  Chest:     Chest wall: No tenderness.  Musculoskeletal:      Cervical back: Normal range of motion.     Right lower leg: No edema.     Left lower leg: No edema.  Skin:    General: Skin is warm and dry.     Nails: There is no clubbing.  Neurological:     Mental Status: He is alert and oriented to person, place, and time.  Psychiatric:        Speech: Speech normal.        Behavior: Behavior normal.        Thought Content: Thought content normal.        Judgment: Judgment normal.     Wt Readings from Last 3 Encounters:  12/03/23 213 lb (96.6 kg)  11/20/23 218 lb (98.9 kg)  07/23/23 234 lb (106.1 kg)     ASSESSMENT & PLAN:   CAD involving the native coronary arteries with precordial pain: Currently without symptoms of angina or cardiac decompensation.  Schedule coronary CTA to evaluate for obstructive CAD given episode of exertional chest discomfort.  Patient is status post prior POBA in 2006.  Continue aggressive risk factor modification and current pharmacotherapy including aspirin 81 mg, recently titrated dose of atorvastatin 80 mg, and lisinopril 5 mg.  HTN: Blood pressure is well-controlled in the office today.  Remains on lisinopril 5 mg daily.  HLD with statin intolerance: LDL 85 in 11/2023 with target LDL less than 70.  Tolerating atorvastatin with recent dose titration to 80 mg.  Follow-up fasting lipid panel and LFT in 2 months with recommendation to escalate lipid-lowering therapy as indicated.  Dilated ascending thoracic aorta: Mildly dilated by echo in 08/2023, measuring 39 mm.  He is scheduled for follow-up echo in 08/2024.   Disposition: F/u with Dr. Kirke Corin or an APP in 6 months, sooner if needed.   Medication Adjustments/Labs and Tests Ordered: Current medicines are reviewed at length with the patient today.  Concerns regarding medicines are outlined above. Medication changes, Labs and Tests ordered today are summarized above and listed in the Patient Instructions accessible in Encounters.   SignedEula Listen, PA-C 12/03/2023  5:34 PM  [facility] [address] [place], [place] [address] [phone]

## 2023-12-03 ENCOUNTER — Ambulatory Visit: Payer: Self-pay | Attending: Physician Assistant | Admitting: Physician Assistant

## 2023-12-03 ENCOUNTER — Encounter: Payer: Self-pay | Admitting: Physician Assistant

## 2023-12-03 VITALS — BP 100/70 | HR 66 | Ht 72.0 in | Wt 213.0 lb

## 2023-12-03 DIAGNOSIS — R072 Precordial pain: Secondary | ICD-10-CM

## 2023-12-03 DIAGNOSIS — E785 Hyperlipidemia, unspecified: Secondary | ICD-10-CM

## 2023-12-03 DIAGNOSIS — Z789 Other specified health status: Secondary | ICD-10-CM

## 2023-12-03 DIAGNOSIS — I10 Essential (primary) hypertension: Secondary | ICD-10-CM

## 2023-12-03 DIAGNOSIS — I77810 Thoracic aortic ectasia: Secondary | ICD-10-CM

## 2023-12-03 DIAGNOSIS — I25118 Atherosclerotic heart disease of native coronary artery with other forms of angina pectoris: Secondary | ICD-10-CM

## 2023-12-03 MED ORDER — IVABRADINE HCL 5 MG PO TABS: 15.0000 mg | ORAL_TABLET | Freq: Once | ORAL | 0 refills | Status: AC

## 2023-12-03 NOTE — Patient Instructions (Signed)
 Medication Instructions:  Your physician recommends the following medication changes.  TAKE: Corlanor 5 mg tablet x 3 (total of 15 mg) approx 2 hours prior to your CTA  *If you need a refill on your cardiac medications before your next appointment, please call your pharmacy*   Lab Work: Your provider would like for you to return in 2 months to have labs drawn.   Please go to [facility] [address] ([facility] [address], [place] [address] You do not need an appointment.  They are open from 8 am- 4:30 pm.  Lunch from 1:00 pm- 2:00 pm You DO need to be fasting.   You may also go to one of the following [facility]:  [address] [place], [place] [address] Phone: [phone] Lab hours: Mon-Fri 8 am- 5 pm    Lunch 12 pm- 1 pm  [address] [place],  [place]  [address]  [place] Phone: [phone] Lab hours: 7 am- 4 pm Lunch 12 pm-1 pm   [address] [place],  [place]  [address]  [place] Phone: [phone] Lab hours: Mon-Fri 8 am- 5 pm    Lunch 12 pm- 1 pm  If you have labs (blood work) drawn today and your tests are completely normal, you will receive your results only by: MyChart Message (if you have MyChart) OR A paper copy in the mail If you have any lab test that is abnormal or we need to change your treatment, we will call you to review the results.   Testing/Procedures:   Your cardiac CT will be scheduled at:   [facility] [address] [place], [place] [address] [phone]  If scheduled at [facility] or [facility], please arrive 15 mins early for check-in and test prep.  Please follow these instructions carefully (unless otherwise directed):  An IV will be required for this test and Nitroglycerin will be given.  Hold all erectile dysfunction medications at least 3 days (72 hrs) prior to test. (Ie viagra, cialis, sildenafil, tadalafil, etc)   On  the Night Before the Test: Be sure to Drink plenty of water. Do not consume any caffeinated/decaffeinated beverages or chocolate 12 hours prior to your test. Do not take any antihistamines 12 hours prior to your test.  On the Day of the Test: Drink plenty of water until 1 hour prior to the test. Do not eat any food 1 hour prior to test. You may take your regular medications prior to the test.  Take metoprolol (Lopressor) two hours prior to test. If you take Furosemide/Hydrochlorothiazide/Spironolactone/Chlorthalidone, please HOLD on the morning of the test.      After the Test: Drink plenty of water. After receiving IV contrast, you may experience a mild flushed feeling. This is normal. On occasion, you may experience a mild rash up to 24 hours after the test. This is not dangerous. If this occurs, you can take Benadryl 25 mg, Zyrtec, Claritin, or Allegra and increase your fluid intake. (Patients taking Tikosyn should avoid Benadryl, and may take Zyrtec, Claritin, or Allegra) If you experience trouble breathing, this can be serious. If it is severe call 911 IMMEDIATELY. If it is mild, please call our office.  We will call to schedule your test 2-4 weeks out understanding that some insurance companies will need an authorization prior to the service being performed.   For more information and frequently asked questions, please visit our website : [email or web address]  For non-scheduling  related questions, please contact the cardiac imaging nurse navigator should you have any questions/concerns: Cardiac Imaging Nurse Navigators Direct Office Dial: [phone]   For scheduling needs, including cancellations and rescheduling, please call [place], [phone].   Follow-Up: At [facility], you and your health needs are our priority.  As part of our continuing mission to provide you with exceptional heart care, we have created designated Provider Care Teams.  These  Care Teams include your primary Cardiologist (physician) and Advanced Practice Providers (APPs -  Physician Assistants and Nurse Practitioners) who all work together to provide you with the care you need, when you need it.    Your next appointment:   6 month(s) call back in April for the appt schedule  Provider:   You may see Lorine Bears, MD or one of the following Advanced Practice Providers on your designated Care Team:   Eula Listen, [place]

## 2023-12-10 ENCOUNTER — Encounter (HOSPITAL_COMMUNITY): Payer: Self-pay

## 2023-12-12 ENCOUNTER — Other Ambulatory Visit: Payer: Self-pay | Admitting: Physician Assistant

## 2023-12-12 ENCOUNTER — Ambulatory Visit
Admission: RE | Admit: 2023-12-12 | Discharge: 2023-12-12 | Disposition: A | Discharge status: Home / Self Care | Payer: Self-pay | Source: Ambulatory Visit | Attending: Physician Assistant | Admitting: Physician Assistant

## 2023-12-12 DIAGNOSIS — R072 Precordial pain: Secondary | ICD-10-CM | POA: Insufficient documentation

## 2023-12-12 DIAGNOSIS — R931 Abnormal findings on diagnostic imaging of heart and coronary circulation: Secondary | ICD-10-CM

## 2023-12-12 DIAGNOSIS — I2510 Atherosclerotic heart disease of native coronary artery without angina pectoris: Secondary | ICD-10-CM

## 2023-12-12 MED ORDER — SODIUM CHLORIDE 0.9 % IV SOLN: INTRAVENOUS | Status: DC

## 2023-12-12 MED ORDER — NITROGLYCERIN 0.4 MG SL SUBL
0.8000 mg | SUBLINGUAL_TABLET | Freq: Once | SUBLINGUAL | Status: AC
  Administered 2023-12-12: 0.8 mg via SUBLINGUAL

## 2023-12-12 MED ORDER — DILTIAZEM HCL 25 MG/5ML IV SOLN: 10.0000 mg | INTRAVENOUS | Status: DC | PRN

## 2023-12-12 MED ORDER — IOHEXOL 350 MG/ML SOLN
75.0000 mL | Freq: Once | INTRAVENOUS | Status: AC | PRN
Start: 2023-12-12 — End: 2023-12-12
  Administered 2023-12-12: 75 mL via INTRAVENOUS

## 2023-12-12 MED ORDER — METOPROLOL TARTRATE 5 MG/5ML IV SOLN
10.0000 mg | INTRAVENOUS | Status: DC | PRN
  Administered 2023-12-12: 5 mg via INTRAVENOUS

## 2023-12-12 NOTE — Progress Notes (Signed)
 Patient tolerated procedure well. Ambulate w/o difficulty. Denies light headedness or being dizzy. Sitting up drinking water provided. Encouraged to drink extra water today and reasoning explained. Verbalized understanding. All questions answered. ABC intact. No further needs. Discharge from procedure area w/o issues.

## 2023-12-16 ENCOUNTER — Telehealth: Payer: Self-pay | Admitting: Cardiovascular Disease

## 2023-12-16 NOTE — Telephone Encounter (Signed)
 Spoke with the patient and scheduled a follow-up appointment for tomorrow, 12/17/23, with Eula Listen, PA

## 2023-12-16 NOTE — Telephone Encounter (Signed)
 Patient is following-up regarding test results.  Patient stated he wants Dr. Kirke Corin only to do any procedures.  Patient stated he does not want work done at [facility].

## 2023-12-16 NOTE — Telephone Encounter (Signed)
 The patient called and stated that if a catheterization procedure is required, he would prefer Dr. Kirke Corin to perform it. The patient was informed that Alycia Rossetti would like to see him in the office this week to discuss results and next steps. The nurse also informed the patient that there may be an opening in Ryan's schedule this week and will call back to schedule an appointment time."

## 2023-12-17 ENCOUNTER — Other Ambulatory Visit
Admission: RE | Admit: 2023-12-17 | Discharge: 2023-12-17 | Disposition: A | Discharge status: Home / Self Care | Payer: Self-pay | Source: Ambulatory Visit | Attending: Physician Assistant | Admitting: Physician Assistant

## 2023-12-17 ENCOUNTER — Encounter: Payer: Self-pay | Admitting: Physician Assistant

## 2023-12-17 ENCOUNTER — Ambulatory Visit: Payer: Self-pay | Attending: Physician Assistant | Admitting: Physician Assistant

## 2023-12-17 VITALS — BP 130/86 | HR 65 | Ht 72.0 in | Wt 207.0 lb

## 2023-12-17 DIAGNOSIS — R9430 Abnormal result of cardiovascular function study, unspecified: Secondary | ICD-10-CM

## 2023-12-17 DIAGNOSIS — I25110 Atherosclerotic heart disease of native coronary artery with unstable angina pectoris: Secondary | ICD-10-CM | POA: Insufficient documentation

## 2023-12-17 DIAGNOSIS — E785 Hyperlipidemia, unspecified: Secondary | ICD-10-CM

## 2023-12-17 DIAGNOSIS — I493 Ventricular premature depolarization: Secondary | ICD-10-CM | POA: Insufficient documentation

## 2023-12-17 DIAGNOSIS — I77810 Thoracic aortic ectasia: Secondary | ICD-10-CM

## 2023-12-17 DIAGNOSIS — I10 Essential (primary) hypertension: Secondary | ICD-10-CM

## 2023-12-17 DIAGNOSIS — Z789 Other specified health status: Secondary | ICD-10-CM

## 2023-12-17 LAB — CBC
HCT: 43.4 % (ref 39.0–52.0)
Hemoglobin: 14.2 g/dL (ref 13.0–17.0)
MCH: 30 pg (ref 26.0–34.0)
MCHC: 32.7 g/dL (ref 30.0–36.0)
MCV: 91.8 fL (ref 80.0–100.0)
Platelets: 218 10*3/uL (ref 150–400)
RBC: 4.73 MIL/uL (ref 4.22–5.81)
RDW: 13.1 % (ref 11.5–15.5)
WBC: 5.8 10*3/uL (ref 4.0–10.5)
nRBC: 0 % (ref 0.0–0.2)

## 2023-12-17 LAB — BASIC METABOLIC PANEL
Anion gap: 8 (ref 5–15)
BUN: 18 mg/dL (ref 6–20)
CO2: 25 mmol/L (ref 22–32)
Calcium: 9.2 mg/dL (ref 8.9–10.3)
Chloride: 106 mmol/L (ref 98–111)
Creatinine, Ser: 0.98 mg/dL (ref 0.61–1.24)
GFR, Estimated: >60 mL/min (ref >60)
Glucose, Bld: 108 mg/dL — ABNORMAL HIGH (ref 70–99)
Potassium: 4 mmol/L (ref 3.5–5.1)
Sodium: 139 mmol/L (ref 135–145)

## 2023-12-17 NOTE — H&P (View-Only) (Signed)
 Cardiology Office Note    Date:  12/17/2023   ID:  BLADE SCHEFF, DOB 10-10-1965, MRN [id number]  PCP:  Kandyce Rud, MD  Cardiologist:  Lorine Bears, MD  Electrophysiologist:  None   Chief Complaint: Follow-up  History of Present Illness:   Chase Jensen is a 58 y.o. male with history of CAD status post balloon angioplasty without stent in 2006 in [place], [place], dilated ascending thoracic aorta, HTN, severe hyperlipidemia, and family history of premature CAD with his father having CABG at age 44 who presents for follow-up of abnormal coronary CTA.   Prior cardiology care in [place], [place], and [employer], [place].  He underwent balloon angioplasty without stent placement in 2006 in [place], [place].  At that time, no stent was placed due to bifurcation location.  He is a non-smoker.  He was last seen in the office in 07/2023 noting his lower extremity nocturnal cramping that felt similar to what he experienced while on ezetimibe.  He also noted lower extremity swelling that was progressive throughout the day and improved early in the morning.  He was without symptoms of shortness of breath or orthopnea.  Echo in 08/2023 showed an EF of 60 to 65%, no regional wall motion abnormalities, normal LV diastolic function parameters, normal RV systolic function and ventricular cavity size, tricuspid aortic valve, and mild dilatation of the ascending aorta measuring 39 mm.  He was most recently seen in the office on 12/03/2023 noting an episode of exertional chest discomfort several days prior that improved after belching and rest.  In this setting he underwent coronary CTA on 12/12/2023 that showed a calcium score of 267, which was the 85th percentile.  There was severe (greater than 70%) proximal LAD stenosis with less than 25% proximal LCx stenosis.  ctFFR 0.65 in the proximal LAD.  He comes in accompanied by his wife today.  Since he was last seen he has had a couple  episodes of exertional chest discomfort/fatigue, once while exercising this past weekend, and most recently while walking in from the parking lot this afternoon at a brisk pace.  With rest, symptoms resolved.  No palpitations, presyncope, or syncope.  He also notes an approximate 89-month history of bilateral lower extremity swelling.  His weight is down 6 pounds today when compared to his visit on 12/03/2023, which was down 21 pounds when compared to his visit in 07/2023.  He previously indicated this was intentional with lifestyle modification.  No progressive orthopnea.  Currently without angina.    Labs independently reviewed: 11/2023 - TSH 6.377, Hgb 15.0, PLT 271, BUN 14, 0-1.04, potassium 4.5, albumin 4.0, AST/ALT normal, TC 153, TG 107, HDL 48, LDL 85   Past Medical History:  Diagnosis Date   Coronary artery disease 2006    balloon angioplasty without stent placement to one-vessel done at [facility] in [facility]    Hyperlipemia    Hypertension    Hypothyroidism     Past Surgical History:  Procedure Laterality Date   CARDIAC SURGERY     CHOLECYSTECTOMY     COLONOSCOPY WITH PROPOFOL N/A 07/29/2015   Procedure: COLONOSCOPY WITH PROPOFOL;  Surgeon: Elnita Maxwell, MD;  Location: [facility] ENDOSCOPY;  Service: Endoscopy;  Laterality: N/A;   CORONARY ANGIOPLASTY     HERNIA REPAIR     NASAL SINUS SURGERY     PILONIDAL CYST DRAINAGE     TONSILLECTOMY      Current Medications: Current Meds  Medication Sig   Ascorbic  Acid (VITAMIN C PO) Take by mouth daily.   aspirin 81 MG EC tablet Take 81 mg by mouth daily. Swallow whole.   atorvastatin (LIPITOR) 80 MG tablet Take 1 tablet (80 mg total) by mouth daily.   levothyroxine (SYNTHROID) 150 MCG tablet Take 150 mcg by mouth daily before breakfast.   lisinopril (PRINIVIL,ZESTRIL) 10 MG tablet Take 5 mg by mouth.    multivitamin-lutein (OCUVITE-LUTEIN) CAPS capsule Take 1 capsule by mouth daily.   SUMAtriptan (IMITREX) 100 MG  tablet Take by mouth.    Allergies:   Zetia [ezetimibe], Penicillins, and Propoxyphene   Social History   Socioeconomic History   Marital status: Married    Spouse name: Not on file   Number of children: Not on file   Years of education: Not on file   Highest education level: Not on file  Occupational History   Not on file  Tobacco Use   Smoking status: Never   Smokeless tobacco: Never  Substance and Sexual Activity   Alcohol use: Not on file   Drug use: Not on file   Sexual activity: Not on file  Other Topics Concern   Not on file  Social History Narrative   Not on file   Social Drivers of Health   Financial Resource Strain: Low Risk  (12/13/2023)   Received from [facility]   Overall Financial Resource Strain (CARDIA)    Difficulty of Paying Living Expenses: Not hard at all  Food Insecurity: No Food Insecurity (12/13/2023)   Received from [facility]   Hunger Vital Sign    Worried About Running Out of Food in the Last Year: Never true    Ran Out of Food in the Last Year: Never true  Transportation Needs: No Transportation Needs (12/13/2023)   Received from [facility] - Transportation    In the past 12 months, has lack of transportation kept you from medical appointments or from getting medications?: No    Lack of Transportation (Non-Medical): No  Physical Activity: Not on file  Stress: Not on file  Social Connections: Not on file     Family History:  The patient's family history is not on file.  ROS:   12-point review of systems is negative unless otherwise noted in the HPI.   EKGs/Labs/Other Studies Reviewed:    Studies reviewed were summarized above. The additional studies were reviewed today:  2D echo 08/12/2023: 1. Left ventricular ejection fraction, by estimation, is 60 to 65%. The  left ventricle has normal function. The left ventricle has no regional  wall motion abnormalities. Left  ventricular diastolic parameters were  normal. The average left ventricular  global longitudinal strain is -16.0 %. The global longitudinal strain is  normal.   2. Right ventricular systolic function is normal. The right ventricular  size is normal.   3. The mitral valve is normal in structure. No evidence of mitral valve  regurgitation.   4. The aortic valve is tricuspid. Aortic valve regurgitation is not  visualized.   5. Aortic dilatation noted. There is mild dilatation of the ascending  aorta, measuring 39 mm.   6. The inferior vena cava is normal in size with greater than 50%  respiratory variability, suggesting right atrial pressure of 3 mmHg.  __________  Coronary CTA 12/12/2023: FINDINGS: Aorta:  Normal size.  No calcifications.  No dissection.   Aortic Valve:  Trileaflet.  No calcifications.   Coronary Arteries:  Normal coronary  origin.  Right dominance.   RCA is a dominant artery. There is calcified plaque in the distal RCA causing mild stenosis (25-49%).   Left main gives rise to LAD and LCX arteries. LM has no disease.   LAD has non calcified plaque proximally causing severe stenosis (>70%).   LCX is a non-dominant artery. There is calcified plaque in the proximal LCx causing minimal stenosis (<25%).   Other findings:   Normal pulmonary vein drainage into the left atrium.   Normal left atrial appendage without a thrombus.   Normal size of the pulmonary artery.   IMPRESSION: 1. Coronary calcium score of 267. This was 85th percentile for age and sex matched control.   2. Normal coronary origin with right dominance.   3. Severe proximal LAD stenosis (>70%).   4. Minimal proximal LCx stenosis (<25%).   5. CAD-RADS 4 Severe stenosis. (70-99% or > 50% left main). Cardiac catheterization is recommended. Consider symptom-guided anti-ischemic pharmacotherapy as well as risk factor modification per guideline directed care.   6. Additional analysis with CT FFR  will be submitted and reported separately.   CT FFR: 1. Left Main:  No significant stenosis.   2. LAD: significant stenosis in proximal LAD.  FFRct 0.65 3. LCX: No significant stenosis.  FFRct 0.80 4. RCA: No significant stenosis.  FFRct 0.90   IMPRESSION: 1. CT FFR analysis showed significant stenosis in the proximal LAD. FFRct 0.65.   2.  Cardiac catheterization recommended.    EKG:  EKG is ordered today.  The EKG ordered today demonstrates NSR with frequent PVCs in a pattern of ventricular bigeminy, no acute ST-T changes NSR, 65 bpm, frequent PVCs in a pattern of ventricular bigeminy with a rare multiform PVC, no acute ST-T changes  Recent Labs: 11/21/2023: ALT 30; BUN 14; Creatinine, Ser 1.04; Potassium 4.5; Sodium 141 12/17/2023: Hemoglobin 14.2; Platelets 218  Recent Lipid Panel    Component Value Date/Time   CHOL 153 11/21/2023 0820   TRIG 107 11/21/2023 0820   HDL 48 11/21/2023 0820   CHOLHDL 3.2 11/21/2023 0820   CHOLHDL 2.4 08/07/2019 0921   VLDL 17 08/07/2019 0921   LDLCALC 85 11/21/2023 0820    PHYSICAL EXAM:    VS:  BP 130/86 (BP Location: Left Arm, Patient Position: Sitting, Cuff Size: Large)   Pulse 65   Ht 6' (1.829 m)   Wt 207 lb (93.9 kg)   SpO2 98%   BMI 28.07 kg/m   BMI: Body mass index is 28.07 kg/m.  Physical Exam Constitutional:      Appearance: He is well-developed.  HENT:     Head: Normocephalic and atraumatic.  Eyes:     General:        Right eye: No discharge.        Left eye: No discharge.  Cardiovascular:     Rate and Rhythm: Normal rate and regular rhythm. Frequent Extrasystoles are present.    Heart sounds: Normal heart sounds, S1 normal and S2 normal. Heart sounds not distant. No midsystolic click and no opening snap. No murmur heard.    No friction rub.  Pulmonary:     Effort: Pulmonary effort is normal. No respiratory distress.     Breath sounds: Normal breath sounds. No decreased breath sounds, wheezing, rhonchi or rales.   Chest:     Chest wall: No tenderness.  Musculoskeletal:     Cervical back: Normal range of motion.     Right lower leg: Edema present.  Left lower leg: Edema present.     Comments: 1+ bilateral pretibial edema.  Skin:    General: Skin is warm and dry.     Nails: There is no clubbing.  Neurological:     Mental Status: He is alert and oriented to person, place, and time.  Psychiatric:        Speech: Speech normal.        Behavior: Behavior normal.        Thought Content: Thought content normal.        Judgment: Judgment normal.     Wt Readings from Last 3 Encounters:  12/17/23 207 lb (93.9 kg)  12/03/23 213 lb (96.6 kg)  11/20/23 218 lb (98.9 kg)     ASSESSMENT & PLAN:   CAD involving the native coronary arteries with unstable angina: Recent coronary CTA with significant proximal LAD stenosis that was positive by CT FFR.  Continues to note intermittent exertional angina and a stuttering pattern.  Currently without symptoms of angina.  Schedule LHC with primary cardiologist at [facility] on 12/18/2023.  Continue aspirin 81 mg and atorvastatin 80 mg.  Schedule echo to evaluate for ICM with recommendation to escalate GDMT as indicated.  ED precautions.  Frequent PVCs: Possibly in the setting of ischemia based on coronary CTA.  If ventricular ectopy persists following potential PCI, will need to pursue Zio patch to quantify burden and also escalate pharmacotherapy.  HTN: Blood pressure is well-controlled in the office today.  Remains on lisinopril 5 mg daily.  HLD with statin intolerance: LDL 85 in 11/2023 with target LDL less than 70.  Currently on atorvastatin 80 mg.  Previously intolerant to ezetimibe secondary to leg cramps.  Following cardiac cath, will need to discuss PCSK9 inhibitor for further risk modification.  Dilated ascending thoracic aorta: Mildly dilated by echo in 08/2023, measuring 39 mm. He is scheduled for follow-up echo in 08/2024.    Informed Consent   Shared  Decision Making/Informed Consent{  The risks [stroke (1 in 1000), death (1 in 1000), kidney failure [usually temporary] (1 in 500), bleeding (1 in 200), allergic reaction [possibly serious] (1 in 200)], benefits (diagnostic support and management of coronary artery disease) and alternatives of a cardiac catheterization were discussed in detail with Mr. Hagins and he is willing to proceed.        Disposition: F/u with Dr. Kirke Corin or an APP after [facility].   Medication Adjustments/Labs and Tests Ordered: Current medicines are reviewed at length with the patient today.  Concerns regarding medicines are outlined above. Medication changes, Labs and Tests ordered today are summarized above and listed in the Patient Instructions accessible in Encounters.   Signed, Eula Listen, PA-C 12/17/2023 5:19 PM     [facility] [place] [address] [place], [place] [address] [phone]

## 2023-12-17 NOTE — Patient Instructions (Signed)
 Medication Instructions:  Your Physician recommend you continue on your current medication as directed.    *If you need a refill on your cardiac medications before your next appointment, please call your pharmacy*   Lab Work: Your provider would like for you to have following labs drawn today CBC and BMeT.   If you have labs (blood work) drawn today and your tests are completely normal, you will receive your results only by: MyChart Message (if you have MyChart) OR A paper copy in the mail If you have any lab test that is abnormal or we need to change your treatment, we will call you to review the results.   Testing/Procedures:  [place] [facility] DEPT OF [facility] [place] [address] [place], [address] [place] [address] Dept: [phone] Loc: [phone]  Chase Jensen  12/17/2023  You are scheduled for a Cardiac Catheterization on Wednesday, March 12 with Dr. Lorine Jensen.  1. Please arrive at the [facility] (Main Entrance A) at [facility]: [address] [place], [place] [address] at 7:30 AM (This time is 2 hour(s) before your procedure to ensure your preparation).   Free valet parking service is available. You will check in at ADMITTING. The support person will be asked to wait in the waiting room.  It is OK to have someone drop you off and come back when you are ready to be discharged.    Special note: Every effort is made to have your procedure done on time. Please understand that emergencies sometimes delay scheduled procedures.  2. Diet: Do not eat solid foods after midnight.  The patient may have clear liquids until 5am upon the day of the procedure.  3. Labs: You will need to have blood drawn today.  4. Medication instructions in preparation for your procedure: Hold Lisinopril on the morning of your procedure - 3/12.   Contrast Allergy: No   On the morning of your procedure, take your  Aspirin 81 mg and any morning medicines NOT listed above.  You may use sips of water.  5. Plan to go home the same day, you will only stay overnight if medically necessary. 6. Bring a current list of your medications and current insurance cards. 7. You MUST have a responsible person to drive you home. 8. Someone MUST be with you the first 24 hours after you arrive home or your discharge will be delayed. 9. Please wear clothes that are easy to get on and off and wear slip-on shoes.  Thank you for allowing [place] to care for you!   --  Invasive Cardiovascular services  Your physician has requested that you have an echocardiogram. Echocardiography is a painless test that uses sound waves to create images of your heart. It provides your doctor with information about the size and shape of your heart and how well your heart's chambers and valves are working.   You may receive an ultrasound enhancing agent through an IV if needed to better visualize your heart during the echo. This procedure takes approximately one hour.  There are no restrictions for this procedure.  This will take place at [address] ([facility] [address], [place] [address]   Follow-Up: At [facility], you and your health needs are our priority.  As part of our continuing mission to provide you with exceptional heart care, we have created designated Provider Care Teams.  These Care Teams include your  primary Cardiologist (physician) and Advanced Practice Providers (APPs -  Physician Assistants and Nurse Practitioners) who all work together to provide you with the care you need, when you need it.    Your next appointment:   2-3 week(s)  Provider:   You may see Chase Bears, MD or Eula Listen, PA-C

## 2023-12-17 NOTE — Progress Notes (Signed)
 Cardiology Office Note    Date:  12/17/2023   ID:  BLADE SCHEFF, DOB 10-10-1965, MRN [id number]  PCP:  Kandyce Rud, MD  Cardiologist:  Lorine Bears, MD  Electrophysiologist:  None   Chief Complaint: Follow-up  History of Present Illness:   Chase Jensen is a 58 y.o. male with history of CAD status post balloon angioplasty without stent in 2006 in [place], [place], dilated ascending thoracic aorta, HTN, severe hyperlipidemia, and family history of premature CAD with his father having CABG at age 44 who presents for follow-up of abnormal coronary CTA.   Prior cardiology care in [place], [place], and [place], [place].  He underwent balloon angioplasty without stent placement in 2006 in [place], [place].  At that time, no stent was placed due to bifurcation location.  He is a non-smoker.  He was last seen in the office in 07/2023 noting his lower extremity nocturnal cramping that felt similar to what he experienced while on ezetimibe.  He also noted lower extremity swelling that was progressive throughout the day and improved early in the morning.  He was without symptoms of shortness of breath or orthopnea.  Echo in 08/2023 showed an EF of 60 to 65%, no regional wall motion abnormalities, normal LV diastolic function parameters, normal RV systolic function and ventricular cavity size, tricuspid aortic valve, and mild dilatation of the ascending aorta measuring 39 mm.  He was most recently seen in the office on 12/03/2023 noting an episode of exertional chest discomfort several days prior that improved after belching and rest.  In this setting he underwent coronary CTA on 12/12/2023 that showed a calcium score of 267, which was the 85th percentile.  There was severe (greater than 70%) proximal LAD stenosis with less than 25% proximal LCx stenosis.  ctFFR 0.65 in the proximal LAD.  He comes in accompanied by his wife today.  Since he was last seen he has had a couple  episodes of exertional chest discomfort/fatigue, once while exercising this past weekend, and most recently while walking in from the parking lot this afternoon at a brisk pace.  With rest, symptoms resolved.  No palpitations, presyncope, or syncope.  He also notes an approximate 89-month history of bilateral lower extremity swelling.  His weight is down 6 pounds today when compared to his visit on 12/03/2023, which was down 21 pounds when compared to his visit in 07/2023.  He previously indicated this was intentional with lifestyle modification.  No progressive orthopnea.  Currently without angina.    Labs independently reviewed: 11/2023 - TSH 6.377, Hgb 15.0, PLT 271, BUN 14, 0-1.04, potassium 4.5, albumin 4.0, AST/ALT normal, TC 153, TG 107, HDL 48, LDL 85   Past Medical History:  Diagnosis Date   Coronary artery disease 2006    balloon angioplasty without stent placement to one-vessel done at [facility] in [facility]    Hyperlipemia    Hypertension    Hypothyroidism     Past Surgical History:  Procedure Laterality Date   CARDIAC SURGERY     CHOLECYSTECTOMY     COLONOSCOPY WITH PROPOFOL N/A 07/29/2015   Procedure: COLONOSCOPY WITH PROPOFOL;  Surgeon: Elnita Maxwell, MD;  Location: [facility] ENDOSCOPY;  Service: Endoscopy;  Laterality: N/A;   CORONARY ANGIOPLASTY     HERNIA REPAIR     NASAL SINUS SURGERY     PILONIDAL CYST DRAINAGE     TONSILLECTOMY      Current Medications: Current Meds  Medication Sig   Ascorbic  Acid (VITAMIN C PO) Take by mouth daily.   aspirin 81 MG EC tablet Take 81 mg by mouth daily. Swallow whole.   atorvastatin (LIPITOR) 80 MG tablet Take 1 tablet (80 mg total) by mouth daily.   levothyroxine (SYNTHROID) 150 MCG tablet Take 150 mcg by mouth daily before breakfast.   lisinopril (PRINIVIL,ZESTRIL) 10 MG tablet Take 5 mg by mouth.    multivitamin-lutein (OCUVITE-LUTEIN) CAPS capsule Take 1 capsule by mouth daily.   SUMAtriptan (IMITREX) 100 MG  tablet Take by mouth.    Allergies:   Zetia [ezetimibe], Penicillins, and Propoxyphene   Social History   Socioeconomic History   Marital status: Married    Spouse name: Not on file   Number of children: Not on file   Years of education: Not on file   Highest education level: Not on file  Occupational History   Not on file  Tobacco Use   Smoking status: Never   Smokeless tobacco: Never  Substance and Sexual Activity   Alcohol use: Not on file   Drug use: Not on file   Sexual activity: Not on file  Other Topics Concern   Not on file  Social History Narrative   Not on file   Social Drivers of Health   Financial Resource Strain: Low Risk  (12/13/2023)   Received from [facility]   Overall Financial Resource Strain (CARDIA)    Difficulty of Paying Living Expenses: Not hard at all  Food Insecurity: No Food Insecurity (12/13/2023)   Received from [facility]   Hunger Vital Sign    Worried About Running Out of Food in the Last Year: Never true    Ran Out of Food in the Last Year: Never true  Transportation Needs: No Transportation Needs (12/13/2023)   Received from [facility] - Transportation    In the past 12 months, has lack of transportation kept you from medical appointments or from getting medications?: No    Lack of Transportation (Non-Medical): No  Physical Activity: Not on file  Stress: Not on file  Social Connections: Not on file     Family History:  The patient's family history is not on file.  ROS:   12-point review of systems is negative unless otherwise noted in the HPI.   EKGs/Labs/Other Studies Reviewed:    Studies reviewed were summarized above. The additional studies were reviewed today:  2D echo 08/12/2023: 1. Left ventricular ejection fraction, by estimation, is 60 to 65%. The  left ventricle has normal function. The left ventricle has no regional  wall motion abnormalities. Left  ventricular diastolic parameters were  normal. The average left ventricular  global longitudinal strain is -16.0 %. The global longitudinal strain is  normal.   2. Right ventricular systolic function is normal. The right ventricular  size is normal.   3. The mitral valve is normal in structure. No evidence of mitral valve  regurgitation.   4. The aortic valve is tricuspid. Aortic valve regurgitation is not  visualized.   5. Aortic dilatation noted. There is mild dilatation of the ascending  aorta, measuring 39 mm.   6. The inferior vena cava is normal in size with greater than 50%  respiratory variability, suggesting right atrial pressure of 3 mmHg.  __________  Coronary CTA 12/12/2023: FINDINGS: Aorta:  Normal size.  No calcifications.  No dissection.   Aortic Valve:  Trileaflet.  No calcifications.   Coronary Arteries:  Normal coronary  origin.  Right dominance.   RCA is a dominant artery. There is calcified plaque in the distal RCA causing mild stenosis (25-49%).   Left main gives rise to LAD and LCX arteries. LM has no disease.   LAD has non calcified plaque proximally causing severe stenosis (>70%).   LCX is a non-dominant artery. There is calcified plaque in the proximal LCx causing minimal stenosis (<25%).   Other findings:   Normal pulmonary vein drainage into the left atrium.   Normal left atrial appendage without a thrombus.   Normal size of the pulmonary artery.   IMPRESSION: 1. Coronary calcium score of 267. This was 85th percentile for age and sex matched control.   2. Normal coronary origin with right dominance.   3. Severe proximal LAD stenosis (>70%).   4. Minimal proximal LCx stenosis (<25%).   5. CAD-RADS 4 Severe stenosis. (70-99% or > 50% left main). Cardiac catheterization is recommended. Consider symptom-guided anti-ischemic pharmacotherapy as well as risk factor modification per guideline directed care.   6. Additional analysis with CT FFR  will be submitted and reported separately.   CT FFR: 1. Left Main:  No significant stenosis.   2. LAD: significant stenosis in proximal LAD.  FFRct 0.65 3. LCX: No significant stenosis.  FFRct 0.80 4. RCA: No significant stenosis.  FFRct 0.90   IMPRESSION: 1. CT FFR analysis showed significant stenosis in the proximal LAD. FFRct 0.65.   2.  Cardiac catheterization recommended.    EKG:  EKG is ordered today.  The EKG ordered today demonstrates NSR with frequent PVCs in a pattern of ventricular bigeminy, no acute ST-T changes NSR, 65 bpm, frequent PVCs in a pattern of ventricular bigeminy with a rare multiform PVC, no acute ST-T changes  Recent Labs: 11/21/2023: ALT 30; BUN 14; Creatinine, Ser 1.04; Potassium 4.5; Sodium 141 12/17/2023: Hemoglobin 14.2; Platelets 218  Recent Lipid Panel    Component Value Date/Time   CHOL 153 11/21/2023 0820   TRIG 107 11/21/2023 0820   HDL 48 11/21/2023 0820   CHOLHDL 3.2 11/21/2023 0820   CHOLHDL 2.4 08/07/2019 0921   VLDL 17 08/07/2019 0921   LDLCALC 85 11/21/2023 0820    PHYSICAL EXAM:    VS:  BP 130/86 (BP Location: Left Arm, Patient Position: Sitting, Cuff Size: Large)   Pulse 65   Ht 6' (1.829 m)   Wt 207 lb (93.9 kg)   SpO2 98%   BMI 28.07 kg/m   BMI: Body mass index is 28.07 kg/m.  Physical Exam Constitutional:      Appearance: He is well-developed.  HENT:     Head: Normocephalic and atraumatic.  Eyes:     General:        Right eye: No discharge.        Left eye: No discharge.  Cardiovascular:     Rate and Rhythm: Normal rate and regular rhythm. Frequent Extrasystoles are present.    Heart sounds: Normal heart sounds, S1 normal and S2 normal. Heart sounds not distant. No midsystolic click and no opening snap. No murmur heard.    No friction rub.  Pulmonary:     Effort: Pulmonary effort is normal. No respiratory distress.     Breath sounds: Normal breath sounds. No decreased breath sounds, wheezing, rhonchi or rales.   Chest:     Chest wall: No tenderness.  Musculoskeletal:     Cervical back: Normal range of motion.     Right lower leg: Edema present.  Left lower leg: Edema present.     Comments: 1+ bilateral pretibial edema.  Skin:    General: Skin is warm and dry.     Nails: There is no clubbing.  Neurological:     Mental Status: He is alert and oriented to person, place, and time.  Psychiatric:        Speech: Speech normal.        Behavior: Behavior normal.        Thought Content: Thought content normal.        Judgment: Judgment normal.     Wt Readings from Last 3 Encounters:  12/17/23 207 lb (93.9 kg)  12/03/23 213 lb (96.6 kg)  11/20/23 218 lb (98.9 kg)     ASSESSMENT & PLAN:   CAD involving the native coronary arteries with unstable angina: Recent coronary CTA with significant proximal LAD stenosis that was positive by CT FFR.  Continues to note intermittent exertional angina and a stuttering pattern.  Currently without symptoms of angina.  Schedule LHC with primary cardiologist at [facility] on 12/18/2023.  Continue aspirin 81 mg and atorvastatin 80 mg.  Schedule echo to evaluate for ICM with recommendation to escalate GDMT as indicated.  ED precautions.  Frequent PVCs: Possibly in the setting of ischemia based on coronary CTA.  If ventricular ectopy persists following potential PCI, will need to pursue Zio patch to quantify burden and also escalate pharmacotherapy.  HTN: Blood pressure is well-controlled in the office today.  Remains on lisinopril 5 mg daily.  HLD with statin intolerance: LDL 85 in 11/2023 with target LDL less than 70.  Currently on atorvastatin 80 mg.  Previously intolerant to ezetimibe secondary to leg cramps.  Following cardiac cath, will need to discuss PCSK9 inhibitor for further risk modification.  Dilated ascending thoracic aorta: Mildly dilated by echo in 08/2023, measuring 39 mm. He is scheduled for follow-up echo in 08/2024.    Informed Consent   Shared  Decision Making/Informed Consent{  The risks [stroke (1 in 1000), death (1 in 1000), kidney failure [usually temporary] (1 in 500), bleeding (1 in 200), allergic reaction [possibly serious] (1 in 200)], benefits (diagnostic support and management of coronary artery disease) and alternatives of a cardiac catheterization were discussed in detail with Mr. Hagins and he is willing to proceed.        Disposition: F/u with Dr. Kirke Corin or an APP after [facility].   Medication Adjustments/Labs and Tests Ordered: Current medicines are reviewed at length with the patient today.  Concerns regarding medicines are outlined above. Medication changes, Labs and Tests ordered today are summarized above and listed in the Patient Instructions accessible in Encounters.   Signed, Eula Listen, PA-C 12/17/2023 5:19 PM     [facility] [place] [address] [place], [place] [address] [phone]

## 2023-12-18 ENCOUNTER — Encounter (HOSPITAL_COMMUNITY)
Admission: AD | Disposition: A | Discharge status: Home / Self Care | Payer: Self-pay | Source: Ambulatory Visit | Attending: Cardiovascular Disease

## 2023-12-18 ENCOUNTER — Other Ambulatory Visit: Payer: Self-pay

## 2023-12-18 ENCOUNTER — Other Ambulatory Visit (HOSPITAL_COMMUNITY): Payer: Self-pay

## 2023-12-18 ENCOUNTER — Ambulatory Visit (HOSPITAL_COMMUNITY)
Admission: AD | Admit: 2023-12-18 | Discharge: 2023-12-18 | Disposition: A | Discharge status: Home / Self Care | Payer: Self-pay | Source: Ambulatory Visit | Attending: Cardiovascular Disease | Admitting: Cardiovascular Disease

## 2023-12-18 DIAGNOSIS — I25110 Atherosclerotic heart disease of native coronary artery with unstable angina pectoris: Secondary | ICD-10-CM | POA: Insufficient documentation

## 2023-12-18 DIAGNOSIS — I10 Essential (primary) hypertension: Secondary | ICD-10-CM | POA: Diagnosis present

## 2023-12-18 DIAGNOSIS — Z79899 Other long term (current) drug therapy: Secondary | ICD-10-CM | POA: Insufficient documentation

## 2023-12-18 DIAGNOSIS — Z8249 Family history of ischemic heart disease and other diseases of the circulatory system: Secondary | ICD-10-CM | POA: Insufficient documentation

## 2023-12-18 DIAGNOSIS — I493 Ventricular premature depolarization: Secondary | ICD-10-CM | POA: Insufficient documentation

## 2023-12-18 DIAGNOSIS — E7800 Pure hypercholesterolemia, unspecified: Secondary | ICD-10-CM | POA: Diagnosis present

## 2023-12-18 DIAGNOSIS — Z7982 Long term (current) use of aspirin: Secondary | ICD-10-CM | POA: Insufficient documentation

## 2023-12-18 DIAGNOSIS — I2510 Atherosclerotic heart disease of native coronary artery without angina pectoris: Secondary | ICD-10-CM | POA: Diagnosis present

## 2023-12-18 DIAGNOSIS — I25118 Atherosclerotic heart disease of native coronary artery with other forms of angina pectoris: Secondary | ICD-10-CM

## 2023-12-18 DIAGNOSIS — Z955 Presence of coronary angioplasty implant and graft: Secondary | ICD-10-CM

## 2023-12-18 HISTORY — PX: LEFT HEART CATH AND CORONARY ANGIOGRAPHY: CATH118249

## 2023-12-18 HISTORY — PX: CORONARY STENT INTERVENTION: CATH118234

## 2023-12-18 LAB — POCT ACTIVATED CLOTTING TIME
Activated Clotting Time: 268 s
Activated Clotting Time: 320 s

## 2023-12-18 SURGERY — LEFT HEART CATH AND CORONARY ANGIOGRAPHY
Anesthesia: LOCAL

## 2023-12-18 MED ORDER — ACETAMINOPHEN 325 MG PO TABS: 650.0000 mg | ORAL_TABLET | ORAL | Status: DC | PRN

## 2023-12-18 MED ORDER — NITROGLYCERIN 0.4 MG SL SUBL
0.4000 mg | SUBLINGUAL_TABLET | SUBLINGUAL | 2 refills | Status: AC | PRN
  Filled 2023-12-18: qty 25, 8d supply, fill #0

## 2023-12-18 MED ORDER — MIDAZOLAM HCL 2 MG/2ML IJ SOLN
INTRAMUSCULAR | Status: DC | PRN
Start: 2023-12-18 — End: 2023-12-18
  Administered 2023-12-18: 1 mg via INTRAVENOUS

## 2023-12-18 MED ORDER — SODIUM CHLORIDE 0.9 % WEIGHT BASED INFUSION: 3.0000 mL/kg/h | INTRAVENOUS | Status: DC

## 2023-12-18 MED ORDER — ONDANSETRON HCL 4 MG/2ML IJ SOLN: 4.0000 mg | Freq: Four times a day (QID) | INTRAMUSCULAR | Status: DC | PRN

## 2023-12-18 MED ORDER — OCUVITE-LUTEIN PO CAPS: 1.0000 | ORAL_CAPSULE | Freq: Every day | ORAL | Status: DC

## 2023-12-18 MED ORDER — SODIUM CHLORIDE 0.9 % WEIGHT BASED INFUSION: 1.0000 mL/kg/h | INTRAVENOUS | Status: DC

## 2023-12-18 MED ORDER — LEVOTHYROXINE SODIUM 150 MCG PO TABS: 150.0000 ug | ORAL_TABLET | Freq: Every day | ORAL | Status: DC

## 2023-12-18 MED ORDER — SODIUM CHLORIDE 0.9% FLUSH: 3.0000 mL | Freq: Two times a day (BID) | INTRAVENOUS | Status: DC

## 2023-12-18 MED ORDER — CLOPIDOGREL BISULFATE 300 MG PO TABS
ORAL_TABLET | ORAL | Status: DC | PRN
  Administered 2023-12-18: 600 mg via ORAL

## 2023-12-18 MED ORDER — FENTANYL CITRATE (PF) 100 MCG/2ML IJ SOLN
INTRAMUSCULAR | Status: AC
  Filled 2023-12-18: qty 2

## 2023-12-18 MED ORDER — HEPARIN (PORCINE) IN NACL 1000-0.9 UT/500ML-% IV SOLN
INTRAVENOUS | Status: DC | PRN
Start: 2023-12-18 — End: 2023-12-18
  Administered 2023-12-18: 500 mL

## 2023-12-18 MED ORDER — LIDOCAINE HCL (PF) 1 % IJ SOLN
INTRAMUSCULAR | Status: DC | PRN
  Administered 2023-12-18: 2 mL via INTRADERMAL

## 2023-12-18 MED ORDER — NITROGLYCERIN 1 MG/10 ML FOR IR/CATH LAB
INTRA_ARTERIAL | Status: DC | PRN
  Administered 2023-12-18: 200 ug via INTRACORONARY

## 2023-12-18 MED ORDER — MIDAZOLAM HCL 2 MG/2ML IJ SOLN
INTRAMUSCULAR | Status: AC
  Filled 2023-12-18: qty 2

## 2023-12-18 MED ORDER — HEPARIN SODIUM (PORCINE) 1000 UNIT/ML IJ SOLN
INTRAMUSCULAR | Status: AC
  Filled 2023-12-18: qty 20

## 2023-12-18 MED ORDER — FENTANYL CITRATE (PF) 100 MCG/2ML IJ SOLN
INTRAMUSCULAR | Status: DC | PRN
  Administered 2023-12-18 (×2): 25 ug via INTRAVENOUS

## 2023-12-18 MED ORDER — CLOPIDOGREL BISULFATE 75 MG PO TABS
75.0000 mg | ORAL_TABLET | Freq: Every day | ORAL | Status: DC
Start: 2023-12-19 — End: 2023-12-18

## 2023-12-18 MED ORDER — LISINOPRIL 10 MG PO TABS: 5.0000 mg | ORAL_TABLET | Freq: Every day | ORAL | Status: DC

## 2023-12-18 MED ORDER — SODIUM CHLORIDE 0.9 % IV SOLN: INTRAVENOUS | Status: AC

## 2023-12-18 MED ORDER — HEPARIN SODIUM (PORCINE) 1000 UNIT/ML IJ SOLN
INTRAMUSCULAR | Status: DC | PRN
  Administered 2023-12-18: 4500 [IU] via INTRAVENOUS
  Administered 2023-12-18: 5000 [IU] via INTRAVENOUS
  Administered 2023-12-18: 3000 [IU] via INTRAVENOUS

## 2023-12-18 MED ORDER — CLOPIDOGREL BISULFATE 75 MG PO TABS
75.0000 mg | ORAL_TABLET | Freq: Every day | ORAL | 2 refills | Status: DC
  Filled 2023-12-18: qty 90, 90d supply, fill #0

## 2023-12-18 MED ORDER — ASPIRIN 81 MG PO CHEW: 81.0000 mg | CHEWABLE_TABLET | ORAL | Status: DC

## 2023-12-18 MED ORDER — SODIUM CHLORIDE 0.9% FLUSH: 3.0000 mL | INTRAVENOUS | Status: DC | PRN

## 2023-12-18 MED ORDER — NITROGLYCERIN 1 MG/10 ML FOR IR/CATH LAB
INTRA_ARTERIAL | Status: AC
  Filled 2023-12-18: qty 10

## 2023-12-18 MED ORDER — VERAPAMIL HCL 2.5 MG/ML IV SOLN
INTRAVENOUS | Status: DC | PRN
  Administered 2023-12-18: 10 mL via INTRA_ARTERIAL

## 2023-12-18 MED ORDER — IOHEXOL 350 MG/ML SOLN
INTRAVENOUS | Status: DC | PRN
  Administered 2023-12-18: 130 mL

## 2023-12-18 MED ORDER — CLOPIDOGREL BISULFATE 300 MG PO TABS
ORAL_TABLET | ORAL | Status: AC
  Filled 2023-12-18: qty 2

## 2023-12-18 MED ORDER — ASPIRIN 81 MG PO TBEC: 81.0000 mg | DELAYED_RELEASE_TABLET | Freq: Every day | ORAL | Status: DC

## 2023-12-18 MED ORDER — SODIUM CHLORIDE 0.9 % IV SOLN: 250.0000 mL | INTRAVENOUS | Status: DC | PRN

## 2023-12-18 MED ORDER — ATORVASTATIN CALCIUM 80 MG PO TABS
80.0000 mg | ORAL_TABLET | Freq: Every day | ORAL | Status: DC
Start: 2023-12-18 — End: 2023-12-18

## 2023-12-18 SURGICAL SUPPLY — 20 items
BALLN EMERGE MR 2.5X12 (BALLOONS) ×1 IMPLANT
BALLN ~~LOC~~ EMERGE MR 4.0X12 (BALLOONS) ×1 IMPLANT
BALLN ~~LOC~~ EMERGE MR 4.5X12 (BALLOONS) ×1 IMPLANT
BALLOON EMERGE MR 2.5X12 (BALLOONS) IMPLANT
BALLOON ~~LOC~~ EMERGE MR 4.0X12 (BALLOONS) IMPLANT
BALLOON ~~LOC~~ EMERGE MR 4.5X12 (BALLOONS) IMPLANT
CATH INFINITI JR4 5F (CATHETERS) IMPLANT
CATH OPTITORQUE JACKY 4.0 5F (CATHETERS) IMPLANT
CATH VISTA GUIDE 6FR XBLAD3.5 (CATHETERS) IMPLANT
DEVICE RAD TR BAND REGULAR (VASCULAR PRODUCTS) IMPLANT
GLIDESHEATH SLEND A-KIT 6F 22G (SHEATH) IMPLANT
GUIDEWIRE INQWIRE 1.5J.035X260 (WIRE) IMPLANT
INQWIRE 1.5J .035X260CM (WIRE) ×1 IMPLANT
KIT ANGIOTOUCH AT X65 (KITS) IMPLANT
KIT ENCORE 26 ADVANTAGE (KITS) IMPLANT
KIT ESSENTIALS PG (KITS) IMPLANT
PACK CARDIAC CATHETERIZATION (CUSTOM PROCEDURE TRAY) ×1 IMPLANT
STENT SYNERGY XD 3.50X20 (Permanent Stent) IMPLANT
STENT SYS SYNERGY XD 3.5X20 (Permanent Stent) ×1 IMPLANT
WIRE RUNTHROUGH .014X180CM (WIRE) IMPLANT

## 2023-12-18 NOTE — Discharge Instructions (Signed)

## 2023-12-18 NOTE — Discharge Summary (Signed)
 Discharge Summary for Same Day PCI   Patient ID: Chase Jensen MRN: [id number]; DOB: [birth year]  Admit date: 12/18/2023 Discharge date: 12/18/2023  Primary Care Provider: Kandyce Rud, MD  Primary Cardiologist: Lorine Bears, MD  Primary Electrophysiologist:  None   Discharge Diagnoses    Principal Problem:   Coronary artery disease involving native coronary artery Active Problems:   Benign essential hypertension   Pure hypercholesterolemia   S/P drug eluting coronary stent placement  Diagnostic Studies/Procedures    Cardiac Catheterization 12/18/2023:   Ramus-2 lesion is 95% stenosed.   Ramus-1 lesion is 80% stenosed.   Prox RCA lesion is 20% stenosed.   RPAV lesion is 30% stenosed.   Prox LAD to Mid LAD lesion is 95% stenosed.   Dist LAD lesion is 40% stenosed.   1st Sept lesion is 90% stenosed.   A drug-eluting stent was successfully placed using a STENT SYS SYNERGY XD 3.5X20.   Post intervention, there is a 0% residual stenosis.   The left ventricular systolic function is normal.   LV end diastolic pressure is mildly elevated.   The left ventricular ejection fraction is 55-65% by visual estimate.   1.  Severe two-vessel coronary artery disease involving the proximal/mid LAD and the superior branch of the ramus artery. 2.  Normal LV systolic function and mildly elevated left ventricular end-diastolic pressure. 3.  Successful angioplasty and drug-eluting stent placement to the LAD using a 3.5 mm stent which was postdilated to 4.2 millimeter distally and 4.5 mm proximally   Recommendations: Dual antiplatelet therapy for at least 6 months. Aggressive treatment of risk factors. Will treat the ramus branch medically for now given diameter of 2.5 and being diseased in 2 different areas.  However, if the patient has residual angina, this can be treated with PCI.   History of Present Illness     Chase Jensen is a 58 y.o. male with past medical history of CAD s/p  PCI 2006, dilated ascending thoracic aorta, hypertension, severe hyperlipidemia.   He had prior cardiology care in [place], [place], and [place], [place] but was seen in our office in 11/2023 noting an episode of exertional chest pain that he noted improved with rest. He underwear a coronary CTA on 12/12/2023 that showed calcium score of 267 (85th percentile), severe (> 70%) stenosis of proximal LAD with ctFFR 0.65 in the proximal LAD and < 25% stenosis of proximal LCx.   He was seen in office by Eula Listen, PA-C on 12/17/2023 complaining of a couple episodes of exertional chest discomfort along with fatigue. Reported the symptoms resolved at rest. He was then scheduled for [facility] on 3/12. His treatment regimen included continuing ASA 81 mg daily, Lipitor 80 mg daily, lisinopril 5 mg daily for HTN. Cardiac catheterization was arranged for further evaluation and was completed on 12/18/2023.  Hospital Course     The patient underwent cardiac cath with DES to LAD as noted above with Dr. Kirke Corin. Plan for DAPT with ASA/Plavix for at least 6 months. The patient was seen by cardiac rehab while in short stay. There were no observed complications post cath. Right radial cath site was re-evaluated prior to discharge and found to be stable without any complications. Instructions/precautions regarding cath site care were given prior to discharge. Given instructions on how to take new medications. Discussed interactions of nitroglycerin with PDE5i, while he is not currently prescribed any reviewed interactions if he were to be started on them in the future.   Chase John  C Jensen was seen by Dr. Kirke Corin and determined stable for discharge home. Follow up with our office has been arranged. Medications are listed below. Pertinent changes include addition of Plavix 75 mg daily and SL nitroglycerin PRN   _____________  Cath/PCI Registry Performance & Quality Measures: Aspirin prescribed? - Yes ADP Receptor Inhibitor  (Plavix/Clopidogrel, Brilinta/Ticagrelor or Effient/Prasugrel) prescribed (includes medically managed patients)? - Yes High Intensity Statin (Lipitor 40-80mg  or Crestor 20-40mg ) prescribed? - Yes For EF <40%, was ACEI/ARB prescribed? - Not Applicable (EF >/= 40%) For EF <40%, Aldosterone Antagonist (Spironolactone or Eplerenone) prescribed? - Not Applicable (EF >/= 40%) Cardiac Rehab Phase II ordered (Included Medically managed Patients)? - Yes _____________  Discharge Vitals Blood pressure 129/82, pulse 70, temperature 97.9 F (36.6 C), temperature source Oral, resp. rate 15, height 6' (1.829 m), weight 93 kg, SpO2 97%.  Filed Weights   12/18/23 0730  Weight: 93 kg   Last Labs & Radiologic Studies    CBC Recent Labs    12/17/23 1713  WBC 5.8  HGB 14.2  HCT 43.4  MCV 91.8  PLT 218   Basic Metabolic Panel Recent Labs    40/98/11 1713  NA 139  K 4.0  CL 106  CO2 25  GLUCOSE 108*  BUN 18  CREATININE 0.98  CALCIUM 9.2   Liver Function Tests No results for input(s): "AST", "ALT", "ALKPHOS", "BILITOT", "PROT", "ALBUMIN" in the last 72 hours. No results for input(s): "LIPASE", "AMYLASE" in the last 72 hours. High Sensitivity Troponin:   No results for input(s): "TROPONINIHS" in the last 720 hours.  BNP Invalid input(s): "POCBNP" D-Dimer No results for input(s): "DDIMER" in the last 72 hours. Hemoglobin A1C No results for input(s): "HGBA1C" in the last 72 hours. Fasting Lipid Panel No results for input(s): "CHOL", "HDL", "LDLCALC", "TRIG", "CHOLHDL", "LDLDIRECT" in the last 72 hours. Thyroid Function Tests No results for input(s): "TSH", "T4TOTAL", "T3FREE", "THYROIDAB" in the last 72 hours.  Invalid input(s): "FREET3" _____________  CARDIAC CATHETERIZATION Addendum Date: 12/18/2023   Ramus-2 lesion is 95% stenosed.   Ramus-1 lesion is 80% stenosed.   Prox RCA lesion is 20% stenosed.   RPAV lesion is 30% stenosed.   Prox LAD to Mid LAD lesion is 95% stenosed.   Dist  LAD lesion is 40% stenosed.   1st Sept lesion is 90% stenosed.   A drug-eluting stent was successfully placed using a STENT SYS SYNERGY XD 3.5X20.   Post intervention, there is a 0% residual stenosis.   The left ventricular systolic function is normal.   LV end diastolic pressure is mildly elevated.   The left ventricular ejection fraction is 55-65% by visual estimate. 1.  Severe two-vessel coronary artery disease involving the proximal/mid LAD and the superior branch of the ramus artery. 2.  Normal LV systolic function and mildly elevated left ventricular end-diastolic pressure. 3.  Successful angioplasty and drug-eluting stent placement to the LAD using a 3.5 mm stent which was postdilated to 4.2 millimeter distally and 4.5 mm proximally Recommendations: Dual antiplatelet therapy for at least 6 months. Aggressive treatment of risk factors. Will treat the ramus branch medically for now given diameter of 2.5 and being diseased in 2 different areas.  However, if the patient has residual angina, this can be treated with PCI.  Result Date: 12/18/2023   Ramus-2 lesion is 95% stenosed.   Ramus-1 lesion is 80% stenosed.   Prox RCA lesion is 20% stenosed.   RPAV lesion is 30% stenosed.   Prox LAD to  Mid LAD lesion is 95% stenosed.   Dist LAD lesion is 40% stenosed.   A drug-eluting stent was successfully placed using a STENT SYS SYNERGY XD 3.5X20.   Post intervention, there is a 0% residual stenosis.   The left ventricular systolic function is normal.   LV end diastolic pressure is mildly elevated.   The left ventricular ejection fraction is 55-65% by visual estimate. 1.  Severe two-vessel coronary artery disease involving the proximal/mid LAD and the superior branch of the ramus artery. 2.  Normal LV systolic function and mildly elevated left ventricular end-diastolic pressure. 3.  Successful angioplasty and drug-eluting stent placement to the LAD using a 3.5 mm stent which was postdilated to 4.2 millimeter distally and  4.5 mm proximally Recommendations: Dual antiplatelet therapy for at least 6 months. Aggressive treatment of risk factors. Will treat the ramus branch medically for now given diameter of 2.5 and being diseased in 2 different areas.  However, if the patient has residual angina, this can be treated with PCI.   CT CORONARY FFR DATA PREP & FLUID ANALYSIS Result Date: 12/13/2023 EXAM: CT FFR ANALYSIS CLINICAL DATA:  Abnormal CCTA FINDINGS: FFRct analysis was performed on the original cardiac CT angiogram dataset. Diagrammatic representation of the FFRct analysis is provided in a separate PDF document in PACS. This dictation was created using the PDF document and an interactive 3D model of the results. 3D model is not available in the EMR/PACS. Normal FFR range is >0.80. 1. Left Main:  No significant stenosis. 2. LAD: significant stenosis in proximal LAD.  FFRct 0.65 3. LCX: No significant stenosis.  FFRct 0.80 4. RCA: No significant stenosis.  FFRct 0.90 IMPRESSION: 1. CT FFR analysis showed significant stenosis in the proximal LAD. FFRct 0.65. 2.  Cardiac catheterization recommended. Electronically Signed   By: Debbe Odea M.D.   On: 12/13/2023 09:29   CT CORONARY MORPH W/CTA COR W/SCORE W/CA W/CM &/OR WO/CM Result Date: 12/12/2023 CLINICAL DATA:  Chest pain EXAM: Cardiac/Coronary  CTA TECHNIQUE: The patient was scanned on a Siemens Somatom go.Top scanner. : A retrospective scan was triggered in the ascending thoracic aorta. Axial non-contrast 3 mm slices were carried out through the heart. The data set was analyzed on a dedicated work station and scored using the Agatson method. Gantry rotation speed was 330 msecs and collimation was .6 mm. 10mg  of metoprolol iv and 0.8 mg of sl NTG was given. The 3D data set was reconstructed in 5% intervals of the 60-95 % of the R-R cycle. Diastolic phases were analyzed on a dedicated work station using MPR, MIP and VRT modes. The patient received 75 cc of contrast.  FINDINGS: Aorta:  Normal size.  No calcifications.  No dissection. Aortic Valve:  Trileaflet.  No calcifications. Coronary Arteries:  Normal coronary origin.  Right dominance. RCA is a dominant artery. There is calcified plaque in the distal RCA causing mild stenosis (25-49%). Left main gives rise to LAD and LCX arteries. LM has no disease. LAD has non calcified plaque proximally causing severe stenosis (>70%). LCX is a non-dominant artery. There is calcified plaque in the proximal LCx causing minimal stenosis (<25%). Other findings: Normal pulmonary vein drainage into the left atrium. Normal left atrial appendage without a thrombus. Normal size of the pulmonary artery. IMPRESSION: 1. Coronary calcium score of 267. This was 85th percentile for age and sex matched control. 2. Normal coronary origin with right dominance. 3. Severe proximal LAD stenosis (>70%). 4. Minimal proximal LCx stenosis (<25%). 5.  CAD-RADS 4 Severe stenosis. (70-99% or > 50% left main). Cardiac catheterization is recommended. Consider symptom-guided anti-ischemic pharmacotherapy as well as risk factor modification per guideline directed care. 6. Additional analysis with CT FFR will be submitted and reported separately. Electronically Signed   By: Debbe Odea M.D.   On: 12/12/2023 14:46   [place] LIMITED JOINT SPACE STRUCTURES LOW LEFT(NO LINKED CHARGES) Result Date: 12/06/2023 Procedure: Real-time Ultrasound Guided Injection of left knee joint superior lateral patella space Device: Philips Affiniti 50G/GE Logiq Images permanently stored and available for review in PACS .  Ultrasound evaluation prior to injection reveals significant degeneration of the lateral meniscus with partial extrusion. Verbal informed consent obtained.  Discussed risks and benefits of procedure. Warned about infection, bleeding, hyperglycemia damage to structures among others. Patient expresses understanding and agreement Time-out conducted.  Noted no overlying  erythema, induration, or other signs of local infection.  Skin prepped in a sterile fashion.  Local anesthesia: Topical Ethyl chloride.  With sterile technique and under real time ultrasound guidance: 40 mg of Kenalog and 2 mL of Marcaine injected into knee joint. Fluid seen entering the joint capsule.  Completed without difficulty  Pain immediately resolved suggesting accurate placement of the medication.  Advised to call if fevers/chills, erythema, induration, drainage, or persistent bleeding.  Images permanently stored and available for review in the ultrasound unit. Impression: Technically successful ultrasound guided injection.     Disposition   Pt is being discharged home today in good condition per MD. Molli Knock to discharge 12/18/2023 at 5 PM  Follow-up Plans & Appointments  Follow up made with Eula Listen, PA-C 01/14/2024 at 2:45pm  Discharge Instructions     Amb Referral to Cardiac Rehabilitation   Complete by: As directed    Diagnosis: Coronary Stents   After initial evaluation and assessments completed: Virtual Based Care may be provided alone or in conjunction with Phase 2 Cardiac Rehab based on patient barriers.: Yes   Intensive Cardiac Rehabilitation (ICR) [facility] location only OR Traditional Cardiac Rehabilitation (TCR) *If criteria for ICR are not met will enroll in TCR [facility] only): Yes   Call MD for:  extreme fatigue   Complete by: As directed    Call MD for:  redness, tenderness, or signs of infection (pain, swelling, redness, odor or green/yellow discharge around incision site)   Complete by: As directed    Diet - low sodium heart healthy   Complete by: As directed    Discharge instructions   Complete by: As directed    PLEASE DO NOT MISS ANY DOSES OF YOUR PLAVIX!!!!! Also keep a log of you blood pressures and bring back to your follow up appt. Please call the office with any questions.   Patients taking blood thinners should generally stay away from medicines like ibuprofen, Advil,  Motrin, naproxen, and Aleve due to risk of stomach bleeding. You may take Tylenol as directed or talk to your primary doctor about alternatives.  PLEASE ENSURE THAT YOU DO NOT RUN OUT OF YOUR PLAVIX. This medication is very important to remain on for at least 6 months. IF you have issues obtaining this medication due to cost please CALL the office 3-5 business days prior to running out in order to prevent missing doses of this medication.   Radial Site Care Refer to this sheet in the next few weeks. These instructions provide you with information on caring for yourself after your procedure. Your caregiver may also give you more specific instructions. Your treatment has been planned  according to current medical practices, but problems sometimes occur. Call your caregiver if you have any problems or questions after your procedure.  HOME CARE INSTRUCTIONS You may shower the day after the procedure. Remove the bandage (dressing) and gently wash the site with plain soap and water. Gently pat the site dry.  Do not apply powder or lotion to the site.  Do not submerge the affected site in water for 3 to 5 days.  Inspect the site at least twice daily.  Do not flex or bend the affected arm for 24 hours.  No lifting over 5 pounds (2.3 kg) for 5 days after your procedure.  Do not drive home if you are discharged the same day of the procedure. Have someone else drive you.  You may drive 24 hours after the procedure unless otherwise instructed by your caregiver.  What to expect: Any bruising will usually fade within 1 to 2 weeks.  Blood that collects in the tissue (hematoma) may be painful to the touch. It should usually decrease in size and tenderness within 1 to 2 weeks.   SEEK IMMEDIATE MEDICAL CARE IF: You have unusual pain at the radial site.  You have redness, warmth, swelling, or pain at the radial site.  You have drainage (other than a small amount of blood on the dressing).  You have chills.   You have a fever or persistent symptoms for more than 72 hours.  You have a fever and your symptoms suddenly get worse.  Your arm becomes pale, cool, tingly, or numb.  You have heavy bleeding from the site. Hold pressure on the site.   Increase activity slowly   Complete by: As directed       Discharge Medications   Allergies as of 12/18/2023       Reactions   Zetia [ezetimibe]    Leg cramps   Penicillins Rash   Propoxyphene Rash   Darvocet N Acetaminophen        Medication List     TAKE these medications    aspirin EC 81 MG tablet Take 81 mg by mouth daily. Swallow whole.   atorvastatin 80 MG tablet Commonly known as: LIPITOR Take 1 tablet (80 mg total) by mouth daily.   clopidogrel 75 MG tablet Commonly known as: Plavix Take 1 tablet (75 mg total) by mouth daily.   fluticasone 50 MCG/ACT nasal spray Commonly known as: FLONASE Place into the nose as needed.   levothyroxine 150 MCG tablet Commonly known as: SYNTHROID Take 150 mcg by mouth daily before breakfast.   lisinopril 10 MG tablet Commonly known as: ZESTRIL Take 5 mg by mouth.   multivitamin-lutein Caps capsule Take 1 capsule by mouth daily.   nitroGLYCERIN 0.4 MG SL tablet Commonly known as: Nitrostat Place 1 tablet (0.4 mg total) under the tongue every 5 (five) minutes as needed for chest pain.   SUMAtriptan 100 MG tablet Commonly known as: IMITREX Take by mouth.   VITAMIN C PO Take by mouth daily.      Allergies Allergies  Allergen Reactions   Zetia [Ezetimibe]     Leg cramps   Penicillins Rash   Propoxyphene Rash    Darvocet N Acetaminophen   Duration of Discharge Encounter  APP time: 20 minutes   Signed, Olena Leatherwood, PA-C 12/18/2023, 2:40 PM

## 2023-12-18 NOTE — Interval H&P Note (Signed)
 Cath Lab Visit (complete for each Cath Lab visit)  Clinical Evaluation Leading to the Procedure:   ACS: No.  Non-ACS:    Anginal Classification: CCS III  Anti-ischemic medical therapy: Minimal Therapy (1 class of medications)  Non-Invasive Test Results: High-risk stress test findings: cardiac mortality >3%/year  Prior CABG: No previous CABG      History and Physical Interval Note:  12/18/2023 9:44 AM  Chase Jensen  has presented today for surgery, with the diagnosis of CHEST PAIN.  The various methods of treatment have been discussed with the patient and family. After consideration of risks, benefits and other options for treatment, the patient has consented to  Procedure(s): LEFT HEART CATH AND CORONARY ANGIOGRAPHY (N/A) as a surgical intervention.  The patient's history has been reviewed, patient examined, no change in status, stable for surgery.  I have reviewed the patient's chart and labs.  Questions were answered to the patient's satisfaction.     Lorine Bears

## 2023-12-18 NOTE — Progress Notes (Signed)
 CARDIAC REHAB PHASE I     Post stent education including site care, restrictions, risk factors, exercise guidelines, NTG use, antiplatelet therapy importance, heart healthy diet and CRP2 reviewed. All questions and concerns addressed. Will refer to [facility] for CRP2. Plan for home later today.    [phone] Woodroe Chen, RN BSN 12/18/2023 2:30 PM

## 2023-12-18 NOTE — Progress Notes (Signed)
 TR band removed at 1330. Gauze dressing applied. Right radial level 0, clean, dry, and intact. Patient walked to the bathroom without difficulties.

## 2023-12-19 ENCOUNTER — Encounter (HOSPITAL_COMMUNITY): Payer: Self-pay | Admitting: Cardiovascular Disease

## 2023-12-22 DIAGNOSIS — I25110 Atherosclerotic heart disease of native coronary artery with unstable angina pectoris: Secondary | ICD-10-CM

## 2023-12-24 NOTE — Telephone Encounter (Signed)
 Please place future orders for BMP and CBC.  Diagnosis CAD.

## 2023-12-24 NOTE — Addendum Note (Signed)
 Addended by: Ursula Alert on: 12/24/2023 05:36 PM   Modules accepted: Orders

## 2024-01-02 ENCOUNTER — Encounter: Payer: Self-pay | Admitting: *Deleted

## 2024-01-02 ENCOUNTER — Encounter: Payer: Self-pay | Attending: Cardiovascular Disease | Admitting: *Deleted

## 2024-01-02 DIAGNOSIS — Z955 Presence of coronary angioplasty implant and graft: Secondary | ICD-10-CM

## 2024-01-02 NOTE — Progress Notes (Signed)
 Virtual orientation call completed today. he has an appointment on Date: 01/21/2024  for EP eval and gym Orientation.  Documentation of diagnosis can be found in [facility] Date: 12/18/2023 .

## 2024-01-08 ENCOUNTER — Ambulatory Visit: Payer: Self-pay

## 2024-01-09 ENCOUNTER — Other Ambulatory Visit: Payer: Self-pay

## 2024-01-13 NOTE — Progress Notes (Unsigned)
 Cardiology Office Note    Date:  01/14/2024   ID:  CEPHUS TUPY, DOB [birth year], MRN [id number]  PCP:  Kandyce Rud, MD  Cardiologist:  Lorine Bears, MD  Electrophysiologist:  None   Chief Complaint: Follow up  History of Present Illness:   Chase Jensen is a 58 y.o. male with history of CAD status post balloon angioplasty without stent in 2006 in [place], [place] s/p PCI/DES to the LAD in 12/2023, dilated ascending thoracic aorta, HTN, severe hyperlipidemia, and family history of premature CAD with his father having CABG at age 6 who presents for follow-up of LHC.   Prior cardiology care in [place], [place], and [place], [place].  He underwent balloon angioplasty without stent placement in 2006 in [place], [place].  At that time, no stent was placed due to bifurcation location.  He is a non-smoker.  He was last seen in the office in 07/2023 noting his lower extremity nocturnal cramping that felt similar to what he experienced while on ezetimibe.  He also noted lower extremity swelling that was progressive throughout the day and improved early in the morning.  He was without symptoms of shortness of breath or orthopnea.  Echo in 08/2023 showed an EF of 60 to 65%, no regional wall motion abnormalities, normal LV diastolic function parameters, normal RV systolic function and ventricular cavity size, tricuspid aortic valve, and mild dilatation of the ascending aorta measuring 39 mm.   He was seen in the office on 12/03/2023 noting an episode of exertional chest discomfort several days prior that improved after belching and rest.  In this setting he underwent coronary CTA on 12/12/2023 that showed a calcium score of 267, which was the 85th percentile.  There was severe (greater than 70%) proximal LAD stenosis with less than 25% proximal LCx stenosis.  ctFFR 0.65 in the proximal LAD.  In this setting he underwent LHC on 12/18/2023 that showed severe two-vessel CAD involving  the proximal/mid LAD and superior branch of the ramus artery as outlined below.  He underwent successful PCI/DES to the LAD.  It was recommended to treat the ramus branch medically given diameter of 2.5 mm and being diseased in 2 different areas with consideration for PCI for refractory angina despite pharmacotherapy.  He comes in accompanied by his wife today and continues to note intermittent twinges of chest discomfort, particularly when laying on his left or right sides.  Discomfort is not noted when laying on his back or with positional change with sitting up.  He is without frank dyspnea though does note a sensation to at times take a deep inhalation.  He also continues to note intermittent heart pounding sensations followed by a brief pause.  Tenderness along the right radial arteriotomy site is improving.  No falls or symptoms concerning for bleeding.  Blood pressure at home has overall been well-controlled.   Labs independently reviewed: 12/2023 - Hgb 14.2, PLT 218, potassium 4.0, BUN 18, serum creatinine 0.98 11/2023 - TSH 6.377, albumin 4.0, AST/ALT normal, TC 153, TG 107, HDL 48, LDL 85   Past Medical History:  Diagnosis Date   Coronary artery disease 2006    balloon angioplasty without stent placement to one-vessel done at [facility] in [facility]    Hyperlipemia    Hypertension    Hypothyroidism     Past Surgical History:  Procedure Laterality Date   CARDIAC SURGERY     CHOLECYSTECTOMY     COLONOSCOPY WITH PROPOFOL N/A 07/29/2015   Procedure:  COLONOSCOPY WITH PROPOFOL;  Surgeon: Elnita Maxwell, MD;  Location: [facility] ENDOSCOPY;  Service: Endoscopy;  Laterality: N/A;   CORONARY ANGIOPLASTY     CORONARY STENT INTERVENTION N/A 12/18/2023   Procedure: CORONARY STENT INTERVENTION;  Surgeon: Iran Ouch, MD;  Location: [facility] INVASIVE CV LAB;  Service: Cardiovascular;  Laterality: N/A;  prox LAD   HERNIA REPAIR     LEFT HEART CATH AND CORONARY ANGIOGRAPHY N/A  12/18/2023   Procedure: LEFT HEART CATH AND CORONARY ANGIOGRAPHY;  Surgeon: Iran Ouch, MD;  Location: [facility] INVASIVE CV LAB;  Service: Cardiovascular;  Laterality: N/A;   NASAL SINUS SURGERY     PILONIDAL CYST DRAINAGE     TONSILLECTOMY      Current Medications: Current Meds  Medication Sig   Ascorbic Acid (VITAMIN C PO) Take by mouth daily.   aspirin 81 MG EC tablet Take 81 mg by mouth daily. Swallow whole.   atorvastatin (LIPITOR) 80 MG tablet Take 1 tablet (80 mg total) by mouth daily.   clopidogrel (PLAVIX) 75 MG tablet Take 1 tablet (75 mg total) by mouth daily.   levothyroxine (SYNTHROID) 150 MCG tablet Take 150 mcg by mouth daily before breakfast.   multivitamin-lutein (OCUVITE-LUTEIN) CAPS capsule Take 1 capsule by mouth daily.   SUMAtriptan (IMITREX) 100 MG tablet Take 100 mg by mouth as needed.   [DISCONTINUED] lisinopril (PRINIVIL,ZESTRIL) 10 MG tablet Take 5 mg by mouth daily.   [DISCONTINUED] metoprolol succinate (TOPROL XL) 25 MG 24 hr tablet Take 0.5 tablets (12.5 mg total) by mouth daily.    Allergies:   Zetia [ezetimibe], Penicillins, and Propoxyphene   Social History   Socioeconomic History   Marital status: Married    Spouse name: Not on file   Number of children: Not on file   Years of education: Not on file   Highest education level: Not on file  Occupational History   Not on file  Tobacco Use   Smoking status: Never   Smokeless tobacco: Never  Vaping Use   Vaping status: Never Used  Substance and Sexual Activity   Alcohol use: Not on file   Drug use: Not on file   Sexual activity: Not on file  Other Topics Concern   Not on file  Social History Narrative   Not on file   Social Drivers of Health   Financial Resource Strain: Low Risk  (12/13/2023)   Received from [facility]   Overall Financial Resource Strain (CARDIA)    Difficulty of Paying Living Expenses: Not hard at all  Food Insecurity: No Food Insecurity (12/13/2023)    Received from [facility]   Hunger Vital Sign    Worried About Running Out of Food in the Last Year: Never true    Ran Out of Food in the Last Year: Never true  Transportation Needs: No Transportation Needs (12/13/2023)   Received from [facility] - Transportation    In the past 12 months, has lack of transportation kept you from medical appointments or from getting medications?: No    Lack of Transportation (Non-Medical): No  Physical Activity: Not on file  Stress: Not on file  Social Connections: Not on file     Family History:  The patient's family history is not on file.  ROS:   12-point review of systems is negative unless otherwise noted in the HPI.   EKGs/Labs/Other Studies Reviewed:    Studies reviewed were summarized above. The additional  studies were reviewed today:   2D echo 08/12/2023: 1. Left ventricular ejection fraction, by estimation, is 60 to 65%. The  left ventricle has normal function. The left ventricle has no regional  wall motion abnormalities. Left ventricular diastolic parameters were  normal. The average left ventricular  global longitudinal strain is -16.0 %. The global longitudinal strain is  normal.   2. Right ventricular systolic function is normal. The right ventricular  size is normal.   3. The mitral valve is normal in structure. No evidence of mitral valve  regurgitation.   4. The aortic valve is tricuspid. Aortic valve regurgitation is not  visualized.   5. Aortic dilatation noted. There is mild dilatation of the ascending  aorta, measuring 39 mm.   6. The inferior vena cava is normal in size with greater than 50%  respiratory variability, suggesting right atrial pressure of 3 mmHg.  __________   Coronary CTA 12/12/2023: FINDINGS: Aorta:  Normal size.  No calcifications.  No dissection.   Aortic Valve:  Trileaflet.  No calcifications.   Coronary Arteries:  Normal coronary origin.  Right  dominance.   RCA is a dominant artery. There is calcified plaque in the distal RCA causing mild stenosis (25-49%).   Left main gives rise to LAD and LCX arteries. LM has no disease.   LAD has non calcified plaque proximally causing severe stenosis (>70%).   LCX is a non-dominant artery. There is calcified plaque in the proximal LCx causing minimal stenosis (<25%).   Other findings:   Normal pulmonary vein drainage into the left atrium.   Normal left atrial appendage without a thrombus.   Normal size of the pulmonary artery.   IMPRESSION: 1. Coronary calcium score of 267. This was 85th percentile for age and sex matched control.   2. Normal coronary origin with right dominance.   3. Severe proximal LAD stenosis (>70%).   4. Minimal proximal LCx stenosis (<25%).   5. CAD-RADS 4 Severe stenosis. (70-99% or > 50% left main). Cardiac catheterization is recommended. Consider symptom-guided anti-ischemic pharmacotherapy as well as risk factor modification per guideline directed care.   6. Additional analysis with CT FFR will be submitted and reported separately.     CT FFR: 1. Left Main:  No significant stenosis.   2. LAD: significant stenosis in proximal LAD.  FFRct 0.65 3. LCX: No significant stenosis.  FFRct 0.80 4. RCA: No significant stenosis.  FFRct 0.90   IMPRESSION: 1. CT FFR analysis showed significant stenosis in the proximal LAD. FFRct 0.65.   2.  Cardiac catheterization recommended. __________  [facility] 12/18/2023:   Ramus-2 lesion is 95% stenosed.   Ramus-1 lesion is 80% stenosed.   Prox RCA lesion is 20% stenosed.   RPAV lesion is 30% stenosed.   Prox LAD to Mid LAD lesion is 95% stenosed.   Dist LAD lesion is 40% stenosed.   1st Sept lesion is 90% stenosed.   A drug-eluting stent was successfully placed using a STENT SYS SYNERGY XD 3.5X20.   Post intervention, there is a 0% residual stenosis.   The left ventricular systolic function is normal.   LV  end diastolic pressure is mildly elevated.   The left ventricular ejection fraction is 55-65% by visual estimate.   1.  Severe two-vessel coronary artery disease involving the proximal/mid LAD and the superior branch of the ramus artery. 2.  Normal LV systolic function and mildly elevated left ventricular end-diastolic pressure. 3.  Successful angioplasty and drug-eluting stent placement to  the LAD using a 3.5 mm stent which was postdilated to 4.2 millimeter distally and 4.5 mm proximally   Recommendations: Dual antiplatelet therapy for at least 6 months. Aggressive treatment of risk factors. Will treat the ramus branch medically for now given diameter of 2.5 and being diseased in 2 different areas.  However, if the patient has residual angina, this can be treated with PCI.   EKG:  EKG is ordered today.  The EKG ordered today demonstrates NSR, 60 bpm, no acute ST-T changes  Recent Labs: 11/21/2023: ALT 30 12/17/2023: BUN 18; Creatinine, Ser 0.98; Hemoglobin 14.2; Platelets 218; Potassium 4.0; Sodium 139  Recent Lipid Panel    Component Value Date/Time   CHOL 153 11/21/2023 0820   TRIG 107 11/21/2023 0820   HDL 48 11/21/2023 0820   CHOLHDL 3.2 11/21/2023 0820   CHOLHDL 2.4 08/07/2019 0921   VLDL 17 08/07/2019 0921   LDLCALC 85 11/21/2023 0820    PHYSICAL EXAM:    VS:  BP 106/70 (BP Location: Left Arm, Patient Position: Sitting, Cuff Size: Large)   Pulse 60   Ht 6' (1.829 m)   Wt 206 lb 9.6 oz (93.7 kg)   SpO2 94%   BMI 28.02 kg/m   BMI: Body mass index is 28.02 kg/m.  Physical Exam Vitals reviewed.  Constitutional:      Appearance: He is well-developed.  HENT:     Head: Normocephalic and atraumatic.  Eyes:     General:        Right eye: No discharge.        Left eye: No discharge.  Cardiovascular:     Rate and Rhythm: Normal rate and regular rhythm.     Heart sounds: Normal heart sounds, S1 normal and S2 normal. Heart sounds not distant. No midsystolic click and no  opening snap. No murmur heard.    No friction rub.     Comments: Right radial arteriotomy site is well-healed without active bleeding, bruising, swelling, warmth, or erythema.  Radial pulse 2+ proximal and distal to the arteriotomy site. Pulmonary:     Effort: Pulmonary effort is normal. No respiratory distress.     Breath sounds: Normal breath sounds. No decreased breath sounds, wheezing, rhonchi or rales.  Chest:     Chest wall: No tenderness.  Musculoskeletal:     Cervical back: Normal range of motion.  Skin:    General: Skin is warm and dry.     Nails: There is no clubbing.  Neurological:     Mental Status: He is alert and oriented to person, place, and time.  Psychiatric:        Speech: Speech normal.        Behavior: Behavior normal.        Thought Content: Thought content normal.        Judgment: Judgment normal.     Wt Readings from Last 3 Encounters:  01/14/24 206 lb 9.6 oz (93.7 kg)  12/18/23 205 lb (93 kg)  12/17/23 207 lb (93.9 kg)     ASSESSMENT & PLAN:   CAD involving native coronary arteries with stable angina: He is status post PCI/DES to the proximal/mid LAD.  Cardiac cath showed residual disease along the first septal and the superior branch ramus intermedius with recommendation to medically manage ramus branch given diameter of 2.5 mm and with the vessel being diseased in 2 different areas.  However, he continues to have symptoms of chest discomfort.  In this setting we will escalate antianginal therapy  with the addition of Toprol-XL 12.5 mg nightly.  If he does not tolerate this due to bradycardia/fatigue we will plan to pursue isosorbide.  Symptoms are not consistent with pericarditis.  Echo currently scheduled for later this month.  Continue DAPT with aspirin 81 mg and clopidogrel 75 mg daily without interruption for at least 6 months from date of PCI (12/18/2023).  Okay to participate in cardiac rehab.  HTN: Blood pressure is well-controlled in the office today.   Discontinue lisinopril with the initiation of Toprol-XL as outlined above for antianginal effect.  HLD with statin intolerance: LDL 85 in 11/2023.  Now on atorvastatin 80 mg.  Anticipate fasting lipid panel and LFT in follow-up.  If LDL remains above goal at that time will need to discuss PCSK9 inhibitor for further risk factor modification.  Dilated ascending thoracic aorta: Mildly dilated by echo in 08/2023, measuring 39 mm.  He is scheduled for follow-up echo in 08/2024.  PVCs: Continues to note symptoms concerning for PVCs.  No evidence of ventricular ectopy on EKG today.  Placed Zio patch to quantify PVC burden.  Start Toprol-XL as outlined above.    Disposition: F/u with Dr. Kirke Corin or an APP in 2 months.   Medication Adjustments/Labs and Tests Ordered: Current medicines are reviewed at length with the patient today.  Concerns regarding medicines are outlined above. Medication changes, Labs and Tests ordered today are summarized above and listed in the Patient Instructions accessible in Encounters.   Signed, Eula Listen, PA-C 01/14/2024 4:46 PM     [facility] [place] [address] [place], [place] [address] [phone]

## 2024-01-14 ENCOUNTER — Ambulatory Visit: Payer: Self-pay | Attending: Physician Assistant | Admitting: Physician Assistant

## 2024-01-14 ENCOUNTER — Encounter: Payer: Self-pay | Admitting: Physician Assistant

## 2024-01-14 ENCOUNTER — Ambulatory Visit: Payer: Self-pay

## 2024-01-14 VITALS — BP 106/70 | HR 60 | Ht 72.0 in | Wt 206.6 lb

## 2024-01-14 DIAGNOSIS — R002 Palpitations: Secondary | ICD-10-CM

## 2024-01-14 DIAGNOSIS — I493 Ventricular premature depolarization: Secondary | ICD-10-CM

## 2024-01-14 DIAGNOSIS — Z789 Other specified health status: Secondary | ICD-10-CM

## 2024-01-14 DIAGNOSIS — Z955 Presence of coronary angioplasty implant and graft: Secondary | ICD-10-CM

## 2024-01-14 DIAGNOSIS — I77810 Thoracic aortic ectasia: Secondary | ICD-10-CM

## 2024-01-14 DIAGNOSIS — I25118 Atherosclerotic heart disease of native coronary artery with other forms of angina pectoris: Secondary | ICD-10-CM

## 2024-01-14 DIAGNOSIS — E785 Hyperlipidemia, unspecified: Secondary | ICD-10-CM

## 2024-01-14 DIAGNOSIS — I10 Essential (primary) hypertension: Secondary | ICD-10-CM

## 2024-01-14 MED ORDER — METOPROLOL SUCCINATE ER 25 MG PO TB24: 12.5000 mg | ORAL_TABLET | Freq: Every evening | ORAL | 3 refills | Status: DC

## 2024-01-14 MED ORDER — METOPROLOL SUCCINATE ER 25 MG PO TB24: 12.5000 mg | ORAL_TABLET | Freq: Every day | ORAL | 3 refills | Status: DC

## 2024-01-14 NOTE — Patient Instructions (Signed)
 Medication Instructions:  Your physician recommends the following medication changes.  STOP TAKING: Lisinopril  START TAKING: Metoprolol Succinate (Toprol-XL) 12.5 mg nightly  *If you need a refill on your cardiac medications before your next appointment, please call your pharmacy*  Lab Work: None ordered at this time   Testing/Procedures: ZIO XT- Long Term Monitor Instructions  Your physician has requested you wear a ZIO patch monitor for 14 days.  This is a single patch monitor. [employer] supplies one patch monitor per enrollment. Additional stickers are not available. Please do not apply patch if you will be having a Nuclear Stress Test, Echocardiogram, Cardiac CT, MRI, or Chest Xray during the period you would be wearing the monitor. The patch cannot be worn during these tests. You cannot remove and re-apply the ZIO XT patch monitor.  Your ZIO patch monitor will be mailed 3 day USPS to your address on file. It may take 3-5 days to receive your monitor after you have been enrolled. Once you have received your monitor, please review the enclosed instructions. Your monitor has already been registered assigning a specific monitor serial # to you.  Billing and Patient Assistance Program Information  We have supplied [employer] with any of your insurance information on file for billing purposes. [employer] offers a sliding scale Patient Assistance Program for patients that do not have insurance, or whose insurance does not completely cover the cost of the ZIO monitor. You must apply for the Patient Assistance Program to qualify for this discounted rate. To apply, please call [employer] at [phone], select option 4, select option 2, ask to apply for Patient Assistance Program. Meredeth Ide will ask your household income, and how many people are in your household. They will quote your out-of-pocket cost based on that information. [employer] will also be able to set up a 71-month, interest-free payment plan if  needed.  Applying the monitor   Shave hair from upper left chest.  Hold abrader disc by orange tab. Rub abrader in 40 strokes over the upper left chest as indicated in your monitor instructions.  Clean area with 4 enclosed alcohol pads. Let dry.  Apply patch as indicated in monitor instructions. Patch will be placed under collarbone on left side of chest with arrow pointing upward.  Rub patch adhesive wings for 2 minutes. Remove white label marked "1". Remove the white label marked "2". Rub patch adhesive wings for 2 additional minutes.  While looking in a mirror, press and release button in center of patch. A small green light will flash 3-4 times. This will be your only indicator that the monitor has been turned on.  Do not shower for the first 24 hours. You may shower after the first 24 hours.  Press the button if you feel a symptom. You will hear a small click. Record Date, Time and Symptom in the Patient Logbook.  When you are ready to remove the patch, follow instructions on the last 2 pages of Patient Logbook. Stick patch monitor onto the last page of Patient Logbook.  Place Patient Logbook in the blue and white box. Use locking tab on box and tape box closed securely. The blue and white box has prepaid postage on it. Please place it in the mailbox as soon as possible. Your physician should have your test results approximately 7 days after the monitor has been mailed back to [facility].  Call [facility] Customer Care at [phone] if you have questions regarding your ZIO XT patch monitor. Call them immediately if  you see an orange light blinking on your monitor.  If your monitor falls off in less than 4 days, contact our Monitor department at [phone].  If your monitor becomes loose or falls off after 4 days call [employer] at [phone] for suggestions on securing your monitor.  Follow-Up: At [facility], you and your health needs are our priority.  As part of our  continuing mission to provide you with exceptional heart care, our providers are all part of one team.  This team includes your primary Cardiologist (physician) and Advanced Practice Providers or APPs (Physician Assistants and Nurse Practitioners) who all work together to provide you with the care you need, when you need it.  Your next appointment:   2 month(s)  Provider:   Eula Listen, PA-C

## 2024-01-21 ENCOUNTER — Encounter: Payer: Self-pay | Attending: Cardiovascular Disease

## 2024-01-21 VITALS — Ht 72.0 in | Wt 205.1 lb

## 2024-01-21 DIAGNOSIS — Z955 Presence of coronary angioplasty implant and graft: Secondary | ICD-10-CM | POA: Insufficient documentation

## 2024-01-21 NOTE — Patient Instructions (Signed)
 Patient Instructions  Patient Details  Name: Chase Jensen MRN: [id number] Date of Birth: [birth year] Referring Provider:  Kandyce Rud, MD  Below are your personal goals for exercise, nutrition, and risk factors. Our goal is to help you stay on track towards obtaining and maintaining these goals. We will be discussing your progress on these goals with you throughout the program.  Initial Exercise Prescription:  Initial Exercise Prescription - 01/21/24 1400       Date of Initial Exercise RX and Referring Provider   Date 01/21/24    Referring Provider Dr. Kandyce Rud      Oxygen   Maintain Oxygen Saturation 88% or higher      Treadmill   MPH 2.8    Grade 1    Minutes 15    METs 3.53      Elliptical   Level 1    Speed 3    Minutes 15    METs 3.76      REL-XR   Level 3    Watts 25    Speed 50    Minutes 15    METs 3.76      Rower   Level 1    Watts 25    Minutes 15    METs 3.76      Intensity   THRR 40-80% of Max Heartrate 100-130    Ratings of Perceived Exertion 11-13    Perceived Dyspnea 0-4      Resistance Training   Training Prescription Yes    Weight 8lb    Reps 10-15             Exercise Goals: Frequency: Be able to perform aerobic exercise two to three times per week in program working toward 2-5 days per week of home exercise.  Intensity: Work with a perceived exertion of 11 (fairly light) - 15 (hard) while following your exercise prescription.  We will make changes to your prescription with you as you progress through the program.   Duration: Be able to do 30 to 45 minutes of continuous aerobic exercise in addition to a 5 minute warm-up and a 5 minute cool-down routine.   Nutrition Goals: Your personal nutrition goals will be established when you do your nutrition analysis with the dietician.  The following are general nutrition guidelines to follow: Cholesterol < 200mg /day Sodium < 1500mg /day Fiber: Men over 50 yrs - 30 grams per  day  Personal Goals:  Personal Goals and Risk Factors at Admission - 01/21/24 1505       Core Components/Risk Factors/Patient Goals on Admission    Weight Management Yes    Intervention Weight Management: Develop a combined nutrition and exercise program designed to reach desired caloric intake, while maintaining appropriate intake of nutrient and fiber, sodium and fats, and appropriate energy expenditure required for the weight goal.;Weight Management: Provide education and appropriate resources to help participant work on and attain dietary goals.    Admit Weight 205 lb 1.6 oz (93 kg)    Goal Weight: Short Term 200 lb (90.7 kg)    Goal Weight: Long Term 199 lb (90.3 kg)    Expected Outcomes Short Term: Continue to assess and modify interventions until short term weight is achieved;Long Term: Adherence to nutrition and physical activity/exercise program aimed toward attainment of established weight goal;Weight Loss: Understanding of general recommendations for a balanced deficit meal plan, which promotes 1-2 lb weight loss per week and includes a negative energy balance of [phone] kcal/d  Hypertension Yes    Intervention Provide education on lifestyle modifcations including regular physical activity/exercise, weight management, moderate sodium restriction and increased consumption of fresh fruit, vegetables, and low fat dairy, alcohol moderation, and smoking cessation.;Monitor prescription use compliance.    Expected Outcomes Short Term: Continued assessment and intervention until BP is < 140/54mm HG in hypertensive participants. < 130/95mm HG in hypertensive participants with diabetes, heart failure or chronic kidney disease.;Long Term: Maintenance of blood pressure at goal levels.    Lipids Yes    Intervention Provide education and support for participant on nutrition & aerobic/resistive exercise along with prescribed medications to achieve LDL 70mg , HDL >40mg .    Expected Outcomes Short  Term: Participant states understanding of desired cholesterol values and is compliant with medications prescribed. Participant is following exercise prescription and nutrition guidelines.;Long Term: Cholesterol controlled with medications as prescribed, with individualized exercise RX and with personalized nutrition plan. Value goals: LDL < 70mg , HDL > 40 mg.             Exercise Goals and Review:  Exercise Goals     Row Name 01/21/24 1502             Exercise Goals   Increase Physical Activity Yes       Intervention Provide advice, education, support and counseling about physical activity/exercise needs.;Develop an individualized exercise prescription for aerobic and resistive training based on initial evaluation findings, risk stratification, comorbidities and participant's personal goals.       Expected Outcomes Short Term: Attend rehab on a regular basis to increase amount of physical activity.;Long Term: Add in home exercise to make exercise part of routine and to increase amount of physical activity.;Long Term: Exercising regularly at least 3-5 days a week.       Increase Strength and Stamina Yes       Intervention Provide advice, education, support and counseling about physical activity/exercise needs.;Develop an individualized exercise prescription for aerobic and resistive training based on initial evaluation findings, risk stratification, comorbidities and participant's personal goals.       Expected Outcomes Short Term: Increase workloads from initial exercise prescription for resistance, speed, and METs.;Short Term: Perform resistance training exercises routinely during rehab and add in resistance training at home;Long Term: Improve cardiorespiratory fitness, muscular endurance and strength as measured by increased METs and functional capacity ( )       Able to understand and use rate of perceived exertion (RPE) scale Yes       Intervention Provide education and explanation on  how to use RPE scale       Expected Outcomes Short Term: Able to use RPE daily in rehab to express subjective intensity level;Long Term:  Able to use RPE to guide intensity level when exercising independently       Able to understand and use Dyspnea scale Yes       Intervention Provide education and explanation on how to use Dyspnea scale       Expected Outcomes Short Term: Able to use Dyspnea scale daily in rehab to express subjective sense of shortness of breath during exertion;Long Term: Able to use Dyspnea scale to guide intensity level when exercising independently       Knowledge and understanding of Target Heart Rate Range (THRR) Yes       Intervention Provide education and explanation of THRR including how the numbers were predicted and where they are located for reference       Expected Outcomes Short Term: Able to state/look up  THRR;Short Term: Able to use daily as guideline for intensity in rehab;Long Term: Able to use THRR to govern intensity when exercising independently       Able to check pulse independently Yes       Intervention Provide education and demonstration on how to check pulse in carotid and radial arteries.;Review the importance of being able to check your own pulse for safety during independent exercise       Expected Outcomes Short Term: Able to explain why pulse checking is important during independent exercise;Long Term: Able to check pulse independently and accurately       Understanding of Exercise Prescription Yes       Intervention Provide education, explanation, and written materials on patient's individual exercise prescription       Expected Outcomes Short Term: Able to explain program exercise prescription;Long Term: Able to explain home exercise prescription to exercise independently                Copy of goals given to participant.

## 2024-01-21 NOTE — Progress Notes (Signed)
 Cardiac Individual Treatment Plan  Patient Details  Name: Chase Jensen MRN: [id number] Date of Birth: 1966/06/12 Referring Provider:   Flowsheet Row Cardiac Rehab from 01/21/2024 in [facility] Cardiac and Pulmonary Rehab  Referring Provider Dr. Kandyce Rud       Initial Encounter Date:  Flowsheet Row Cardiac Rehab from 01/21/2024 in [facility] Cardiac and Pulmonary Rehab  Date 01/21/24       Visit Diagnosis: Status post coronary artery stent placement  Patient's Home Medications on Admission:  Current Outpatient Medications:    Ascorbic Acid (VITAMIN C PO), Take by mouth daily., Disp: , Rfl:    aspirin 81 MG EC tablet, Take 81 mg by mouth daily. Swallow whole., Disp: , Rfl:    atorvastatin (LIPITOR) 80 MG tablet, Take 1 tablet (80 mg total) by mouth daily., Disp: 90 tablet, Rfl: 3   clopidogrel (PLAVIX) 75 MG tablet, Take 1 tablet (75 mg total) by mouth daily., Disp: 90 tablet, Rfl: 2   fluticasone (FLONASE) 50 MCG/ACT nasal spray, Place into the nose as needed. (Patient not taking: Reported on 01/14/2024), Disp: , Rfl:    levothyroxine (SYNTHROID) 150 MCG tablet, Take 150 mcg by mouth daily before breakfast., Disp: , Rfl:    metoprolol succinate (TOPROL XL) 25 MG 24 hr tablet, Take 0.5 tablets (12.5 mg total) by mouth at bedtime., Disp: 45 tablet, Rfl: 3   multivitamin-lutein (OCUVITE-LUTEIN) CAPS capsule, Take 1 capsule by mouth daily., Disp: , Rfl:    nitroGLYCERIN (NITROSTAT) 0.4 MG SL tablet, Place 1 tablet (0.4 mg total) under the tongue every 5 (five) minutes as needed for chest pain. (Patient not taking: Reported on 01/14/2024), Disp: 25 tablet, Rfl: 2   SUMAtriptan (IMITREX) 100 MG tablet, Take 100 mg by mouth as needed., Disp: , Rfl:   Past Medical History: Past Medical History:  Diagnosis Date   Coronary artery disease 2006    balloon angioplasty without stent placement to one-vessel done at [facility] in [facility]    Hyperlipemia    Hypertension    Hypothyroidism      Tobacco Use: Social History   Tobacco Use  Smoking Status Never  Smokeless Tobacco Never    Labs: Review Flowsheet       Latest Ref Rng & Units 08/07/2019 11/21/2023  Labs for ITP Cardiac and Pulmonary Rehab  Cholestrol 100 - 199 mg/dL 295  284   LDL (calc) 0 - 99 mg/dL 51  85   HDL-C >13 mg/dL 47  48   Trlycerides 0 - 149 mg/dL 87  244      Exercise Target Goals: Exercise Program Goal: Individual exercise prescription set using results from initial 6 min walk test and THRR while considering  patient's activity barriers and safety.   Exercise Prescription Goal: Initial exercise prescription builds to 30-45 minutes a day of aerobic activity, 2-3 days per week.  Home exercise guidelines will be given to patient during program as part of exercise prescription that the participant will acknowledge.   Education: Aerobic Exercise: - Group verbal and visual presentation on the components of exercise prescription. Introduces F.I.T.T principle from ACSM for exercise prescriptions.  Reviews F.I.T.T. principles of aerobic exercise including progression. Written material given at graduation.   Education: Resistance Exercise: - Group verbal and visual presentation on the components of exercise prescription. Introduces F.I.T.T principle from ACSM for exercise prescriptions  Reviews F.I.T.T. principles of resistance exercise including progression. Written material given at graduation.    Education: Exercise & Equipment Safety: - Individual  verbal instruction and demonstration of equipment use and safety with use of the equipment. Flowsheet Row Cardiac Rehab from 01/21/2024 in [facility] Cardiac and Pulmonary Rehab  Date 01/21/24  Educator [facility]  Instruction Review Code 1- Verbalizes Understanding       Education: Exercise Physiology & General Exercise Guidelines: - Group verbal and written instruction with models to review the exercise physiology of the cardiovascular system and  associated critical values. Provides general exercise guidelines with specific guidelines to those with heart or lung disease.    Education: Flexibility, Balance, Mind/Body Relaxation: - Group verbal and visual presentation with interactive activity on the components of exercise prescription. Introduces F.I.T.T principle from ACSM for exercise prescriptions. Reviews F.I.T.T. principles of flexibility and balance exercise training including progression. Also discusses the mind body connection.  Reviews various relaxation techniques to help reduce and manage stress (i.e. Deep breathing, progressive muscle relaxation, and visualization). Balance handout provided to take home. Written material given at graduation.   Activity Barriers & Risk Stratification:  Activity Barriers & Cardiac Risk Stratification - 01/02/24 1345       Activity Barriers & Cardiac Risk Stratification   Activity Barriers None    Cardiac Risk Stratification Moderate             6 Minute Walk:  6 Minute Walk     Row Name 01/21/24 1457         6 Minute Walk   Phase Initial     Distance 1550 feet     Walk Time 6 minutes     # of Rest Breaks 0     MPH 2.9     METS 3.8     RPE 9     Perceived Dyspnea  0     Symptoms No     Resting HR 71 bpm     Resting BP 116/62     Resting Oxygen Saturation  96 %     Exercise Oxygen Saturation  during 6 min walk 98 %     Max Ex. HR 88 bpm     Max Ex. BP 126/64     2 Minute Post BP 122/62              Oxygen Initial Assessment:   Oxygen Re-Evaluation:   Oxygen Discharge (Final Oxygen Re-Evaluation):   Initial Exercise Prescription:  Initial Exercise Prescription - 01/21/24 1400       Date of Initial Exercise RX and Referring Provider   Date 01/21/24    Referring Provider Dr. Nestor Banter      Oxygen   Maintain Oxygen Saturation 88% or higher      Treadmill   MPH 2.8    Grade 1    Minutes 15    METs 3.53      Elliptical   Level 1    Speed 3     Minutes 15    METs 3.76      REL-XR   Level 3    Watts 25    Speed 50    Minutes 15    METs 3.76      Rower   Level 1    Watts 25    Minutes 15    METs 3.76      Intensity   THRR 40-80% of Max Heartrate 100-130    Ratings of Perceived Exertion 11-13    Perceived Dyspnea 0-4      Resistance Training   Training Prescription Yes    Weight 8lb  Reps 10-15             Perform Capillary Blood Glucose checks as needed.  Exercise Prescription Changes:   Exercise Prescription Changes     Row Name 01/21/24 1500             Response to Exercise   Blood Pressure (Admit) 116/62       Blood Pressure (Exercise) 126/64       Blood Pressure (Exit) 122/62       Heart Rate (Admit) 71 bpm       Heart Rate (Exercise) 88 bpm       Heart Rate (Exit) 67 bpm       Oxygen Saturation (Admit) 96 %       Oxygen Saturation (Exercise) 99 %       Oxygen Saturation (Exit) 99 %       Rating of Perceived Exertion (Exercise) 9       Perceived Dyspnea (Exercise) 0       Symptoms none       Comments results                Exercise Comments:   Exercise Goals and Review:   Exercise Goals     Row Name 01/21/24 1502             Exercise Goals   Increase Physical Activity Yes       Intervention Provide advice, education, support and counseling about physical activity/exercise needs.;Develop an individualized exercise prescription for aerobic and resistive training based on initial evaluation findings, risk stratification, comorbidities and participant's personal goals.       Expected Outcomes Short Term: Attend rehab on a regular basis to increase amount of physical activity.;Long Term: Add in home exercise to make exercise part of routine and to increase amount of physical activity.;Long Term: Exercising regularly at least 3-5 days a week.       Increase Strength and Stamina Yes       Intervention Provide advice, education, support and counseling about physical  activity/exercise needs.;Develop an individualized exercise prescription for aerobic and resistive training based on initial evaluation findings, risk stratification, comorbidities and participant's personal goals.       Expected Outcomes Short Term: Increase workloads from initial exercise prescription for resistance, speed, and METs.;Short Term: Perform resistance training exercises routinely during rehab and add in resistance training at home;Long Term: Improve cardiorespiratory fitness, muscular endurance and strength as measured by increased METs and functional capacity ( )       Able to understand and use rate of perceived exertion (RPE) scale Yes       Intervention Provide education and explanation on how to use RPE scale       Expected Outcomes Short Term: Able to use RPE daily in rehab to express subjective intensity level;Long Term:  Able to use RPE to guide intensity level when exercising independently       Able to understand and use Dyspnea scale Yes       Intervention Provide education and explanation on how to use Dyspnea scale       Expected Outcomes Short Term: Able to use Dyspnea scale daily in rehab to express subjective sense of shortness of breath during exertion;Long Term: Able to use Dyspnea scale to guide intensity level when exercising independently       Knowledge and understanding of Target Heart Rate Range (THRR) Yes       Intervention Provide education and explanation of THRR  including how the numbers were predicted and where they are located for reference       Expected Outcomes Short Term: Able to state/look up THRR;Short Term: Able to use daily as guideline for intensity in rehab;Long Term: Able to use THRR to govern intensity when exercising independently       Able to check pulse independently Yes       Intervention Provide education and demonstration on how to check pulse in carotid and radial arteries.;Review the importance of being able to check your own pulse for  safety during independent exercise       Expected Outcomes Short Term: Able to explain why pulse checking is important during independent exercise;Long Term: Able to check pulse independently and accurately       Understanding of Exercise Prescription Yes       Intervention Provide education, explanation, and written materials on patient's individual exercise prescription       Expected Outcomes Short Term: Able to explain program exercise prescription;Long Term: Able to explain home exercise prescription to exercise independently                Exercise Goals Re-Evaluation :   Discharge Exercise Prescription (Final Exercise Prescription Changes):  Exercise Prescription Changes - 01/21/24 1500       Response to Exercise   Blood Pressure (Admit) 116/62    Blood Pressure (Exercise) 126/64    Blood Pressure (Exit) 122/62    Heart Rate (Admit) 71 bpm    Heart Rate (Exercise) 88 bpm    Heart Rate (Exit) 67 bpm    Oxygen Saturation (Admit) 96 %    Oxygen Saturation (Exercise) 99 %    Oxygen Saturation (Exit) 99 %    Rating of Perceived Exertion (Exercise) 9    Perceived Dyspnea (Exercise) 0    Symptoms none    Comments results             Nutrition:  Target Goals: Understanding of nutrition guidelines, daily intake of sodium 1500mg , cholesterol 200mg , calories 30% from fat and 7% or less from saturated fats, daily to have 5 or more servings of fruits and vegetables.  Education: All About Nutrition: -Group instruction provided by verbal, written material, interactive activities, discussions, models, and posters to present general guidelines for heart healthy nutrition including fat, fiber, MyPlate, the role of sodium in heart healthy nutrition, utilization of the nutrition label, and utilization of this knowledge for meal planning. Follow up email sent as well. Written material given at graduation. Flowsheet Row Cardiac Rehab from 01/21/2024 in [facility] Cardiac and Pulmonary  Rehab  Education need identified 01/21/24       Biometrics:  Pre Biometrics - 01/21/24 1503       Pre Biometrics   Height 6' (1.829 m)    Weight 205 lb 1.6 oz (93 kg)    Waist Circumference 38.5 inches    Hip Circumference 39.5 inches    Waist to Hip Ratio 0.97 %    BMI (Calculated) 27.81    Single Leg Stand 30 seconds              Nutrition Therapy Plan and Nutrition Goals:   Nutrition Assessments:  MEDIFICTS Score Key: >=70 Need to make dietary changes  40-70 Heart Healthy Diet <= 40 Therapeutic Level Cholesterol Diet  Flowsheet Row Cardiac Rehab from 01/21/2024 in [facility] Cardiac and Pulmonary Rehab  Picture Your Plate Total Score on Admission 70      Picture Your Plate Scores: <  40 Unhealthy dietary pattern with much room for improvement. 41-50 Dietary pattern unlikely to meet recommendations for good health and room for improvement. 51-60 More healthful dietary pattern, with some room for improvement.  >60 Healthy dietary pattern, although there may be some specific behaviors that could be improved.    Nutrition Goals Re-Evaluation:   Nutrition Goals Discharge (Final Nutrition Goals Re-Evaluation):   Psychosocial: Target Goals: Acknowledge presence or absence of significant depression and/or stress, maximize coping skills, provide positive support system. Participant is able to verbalize types and ability to use techniques and skills needed for reducing stress and depression.   Education: Stress, Anxiety, and Depression - Group verbal and visual presentation to define topics covered.  Reviews how body is impacted by stress, anxiety, and depression.  Also discusses healthy ways to reduce stress and to treat/manage anxiety and depression.  Written material given at graduation.   Education: Sleep Hygiene -Provides group verbal and written instruction about how sleep can affect your health.  Define sleep hygiene, discuss sleep cycles and impact of sleep  habits. Review good sleep hygiene tips.    Initial Review & Psychosocial Screening:  Initial Psych Review & Screening - 01/02/24 1345       Initial Review   Current issues with None Identified      Family Dynamics   Good Support System? Yes   wife,   daughter 3 hours away, 2 sons out of state.     Barriers   Psychosocial barriers to participate in program There are no identifiable barriers or psychosocial needs.      Screening Interventions   Interventions To provide support and resources with identified psychosocial needs;Provide feedback about the scores to participant    Expected Outcomes Short Term goal: Utilizing psychosocial counselor, staff and physician to assist with identification of specific Stressors or current issues interfering with healing process. Setting desired goal for each stressor or current issue identified.;Long Term Goal: Stressors or current issues are controlled or eliminated.;Short Term goal: Identification and review with participant of any Quality of Life or Depression concerns found by scoring the questionnaire.;Long Term goal: The participant improves quality of Life and PHQ9 Scores as seen by post scores and/or verbalization of changes             Quality of Life Scores:   Quality of Life - 01/21/24 1503       Quality of Life   Select Quality of Life      Quality of Life Scores   Health/Function Pre 24.3 %    Socioeconomic Pre 22.88 %    Psych/Spiritual Pre 24.64 %    Family Pre 25.2 %    GLOBAL Pre 24.17 %            Scores of 19 and below usually indicate a poorer quality of life in these areas.  A difference of  2-3 points is a clinically meaningful difference.  A difference of 2-3 points in the total score of the Quality of Life Index has been associated with significant improvement in overall quality of life, self-image, physical symptoms, and general health in studies assessing change in quality of life.  PHQ-9: Review Flowsheet        01/21/2024  Depression screen PHQ 2/9  Decreased Interest 0  Down, Depressed, Hopeless 0  PHQ - 2 Score 0  Altered sleeping 0  Tired, decreased energy 1  Change in appetite 0  Feeling bad or failure about yourself  0  Trouble concentrating  0  Moving slowly or fidgety/restless 0  Suicidal thoughts 0  PHQ-9 Score 1  Difficult doing work/chores Not difficult at all   Interpretation of Total Score  Total Score Depression Severity:  1-4 = Minimal depression, 5-9 = Mild depression, 10-14 = Moderate depression, 15-19 = Moderately severe depression, 20-27 = Severe depression   Psychosocial Evaluation and Intervention:  Psychosocial Evaluation - 01/02/24 1357       Psychosocial Evaluation & Interventions   Interventions Encouraged to exercise with the program and follow exercise prescription    Comments There are no barriers to attending the program.  He is ready to  work improving his lifestyle to be able tomanage his heart disease.  He has been on a weight loss journey since last Noverber,; loosing almost 30 lbs to today.  HAs a goal of a few more pounds to lose. He lives with his wife and she and their family are his support.   a daughter lives 3 hours away and 2 sons [place]. He had to delay his start date because his wife's father was disgnosed with pancreatic cancer and they are going to [place] to see him and her mother (who has dementia). He is ready to start the program once he returns home.    Expected Outcomes STG attend all scheduled sessions, continue weight loss to goal, follow exercise guidelines working on exercise progression.   LTG able to continue on weight loss/maintainence and exercise progression after discharge    Continue Psychosocial Services  Follow up required by staff             Psychosocial Re-Evaluation:   Psychosocial Discharge (Final Psychosocial Re-Evaluation):   Vocational Rehabilitation: Provide vocational rehab assistance to  qualifying candidates.   Vocational Rehab Evaluation & Intervention:   Education: Education Goals: Education classes will be provided on a variety of topics geared toward better understanding of heart health and risk factor modification. Participant will state understanding/return demonstration of topics presented as noted by education test scores.  Learning Barriers/Preferences:   General Cardiac Education Topics:  AED/CPR: - Group verbal and written instruction with the use of models to demonstrate the basic use of the AED with the basic ABC's of resuscitation.   Anatomy and Cardiac Procedures: - Group verbal and visual presentation and models provide information about basic cardiac anatomy and function. Reviews the testing methods done to diagnose heart disease and the outcomes of the test results. Describes the treatment choices: Medical Management, Angioplasty, or Coronary Bypass Surgery for treating various heart conditions including Myocardial Infarction, Angina, Valve Disease, and Cardiac Arrhythmias.  Written material given at graduation.   Medication Safety: - Group verbal and visual instruction to review commonly prescribed medications for heart and lung disease. Reviews the medication, class of the drug, and side effects. Includes the steps to properly store meds and maintain the prescription regimen.  Written material given at graduation.   Intimacy: - Group verbal instruction through game format to discuss how heart and lung disease can affect sexual intimacy. Written material given at graduation..   Know Your Numbers and Heart Failure: - Group verbal and visual instruction to discuss disease risk factors for cardiac and pulmonary disease and treatment options.  Reviews associated critical values for Overweight/Obesity, Hypertension, Cholesterol, and Diabetes.  Discusses basics of heart failure: signs/symptoms and treatments.  Introduces Heart Failure Zone chart for action  plan for heart failure.  Written material given at graduation. Flowsheet Row Cardiac Rehab from 01/21/2024 in [facility] Cardiac  and Pulmonary Rehab  Education need identified 01/21/24       Infection Prevention: - Provides verbal and written material to individual with discussion of infection control including proper hand washing and proper equipment cleaning during exercise session. Flowsheet Row Cardiac Rehab from 01/21/2024 in [facility] Cardiac and Pulmonary Rehab  Date 01/21/24  Educator [facility]  Instruction Review Code 1- Verbalizes Understanding       Falls Prevention: - Provides verbal and written material to individual with discussion of falls prevention and safety. Flowsheet Row Cardiac Rehab from 01/21/2024 in [facility] Cardiac and Pulmonary Rehab  Date 01/21/24  Educator [facility]  Instruction Review Code 1- Verbalizes Understanding       Other: -Provides group and verbal instruction on various topics (see comments)   Knowledge Questionnaire Score:  Knowledge Questionnaire Score - 01/21/24 1505       Knowledge Questionnaire Score   Pre Score 23/26             Core Components/Risk Factors/Patient Goals at Admission:  Personal Goals and Risk Factors at Admission - 01/21/24 1505       Core Components/Risk Factors/Patient Goals on Admission    Weight Management Yes    Intervention Weight Management: Develop a combined nutrition and exercise program designed to reach desired caloric intake, while maintaining appropriate intake of nutrient and fiber, sodium and fats, and appropriate energy expenditure required for the weight goal.;Weight Management: Provide education and appropriate resources to help participant work on and attain dietary goals.    Admit Weight 205 lb 1.6 oz (93 kg)    Goal Weight: Short Term 200 lb (90.7 kg)    Goal Weight: Long Term 199 lb (90.3 kg)    Expected Outcomes Short Term: Continue to assess and modify interventions until short term weight is achieved;Long  Term: Adherence to nutrition and physical activity/exercise program aimed toward attainment of established weight goal;Weight Loss: Understanding of general recommendations for a balanced deficit meal plan, which promotes 1-2 lb weight loss per week and includes a negative energy balance of [phone] kcal/d    Hypertension Yes    Intervention Provide education on lifestyle modifcations including regular physical activity/exercise, weight management, moderate sodium restriction and increased consumption of fresh fruit, vegetables, and low fat dairy, alcohol moderation, and smoking cessation.;Monitor prescription use compliance.    Expected Outcomes Short Term: Continued assessment and intervention until BP is < 140/46mm HG in hypertensive participants. < 130/63mm HG in hypertensive participants with diabetes, heart failure or chronic kidney disease.;Long Term: Maintenance of blood pressure at goal levels.    Lipids Yes    Intervention Provide education and support for participant on nutrition & aerobic/resistive exercise along with prescribed medications to achieve LDL 70mg , HDL >40mg .    Expected Outcomes Short Term: Participant states understanding of desired cholesterol values and is compliant with medications prescribed. Participant is following exercise prescription and nutrition guidelines.;Long Term: Cholesterol controlled with medications as prescribed, with individualized exercise RX and with personalized nutrition plan. Value goals: LDL < 70mg , HDL > 40 mg.             Education:Diabetes - Individual verbal and written instruction to review signs/symptoms of diabetes, desired ranges of glucose level fasting, after meals and with exercise. Acknowledge that pre and post exercise glucose checks will be done for 3 sessions at entry of program.   Core Components/Risk Factors/Patient Goals Review:    Core Components/Risk Factors/Patient Goals at Discharge (Final Review):    ITP Comments:   ITP Comments  Row Name 01/02/24 1426 01/21/24 1456         ITP Comments Virtual orientation call completed today. he has an appointment on Date: 01/21/2024  for EP eval and gym Orientation.  Documentation of diagnosis [facility] be found in [facility] Date: 12/18/2023 . Completed and gym orientation for cardiac rehab. Initial ITP created and sent for review to Dr. Firman Hughes, Medical Director.               Comments: Initial ITP

## 2024-01-23 ENCOUNTER — Ambulatory Visit: Payer: Self-pay | Attending: Physician Assistant

## 2024-01-23 DIAGNOSIS — I493 Ventricular premature depolarization: Secondary | ICD-10-CM

## 2024-01-23 LAB — ECHOCARDIOGRAM COMPLETE
AR max vel: 3.67 cm2
AV Area VTI: 3.77 cm2
AV Area mean vel: 3.38 cm2
AV Mean grad: 3 mmHg
AV Peak grad: 5.7 mmHg
Ao pk vel: 1.19 m/s
Area-P 1/2: 4.06 cm2
S' Lateral: 3.4 cm

## 2024-01-27 ENCOUNTER — Other Ambulatory Visit: Payer: Self-pay

## 2024-01-27 ENCOUNTER — Encounter: Payer: Self-pay | Admitting: *Deleted

## 2024-01-27 DIAGNOSIS — Z955 Presence of coronary angioplasty implant and graft: Secondary | ICD-10-CM

## 2024-01-27 NOTE — Progress Notes (Signed)
 Daily Session Note  Patient Details  Name: Chase Jensen MRN: [id number] Date of Birth: 05-23-1966 Referring Provider:   Flowsheet Row Cardiac Rehab from 01/21/2024 in [facility] Cardiac and Pulmonary Rehab  Referring Provider Dr. Nestor Banter       Encounter Date: 01/27/2024  Check In:  Session Check In - 01/27/24 1354       Check-In   Supervising physician immediately available to respond to emergencies See telemetry face sheet for immediately available ER MD    Location [facility]-Cardiac & Pulmonary Rehab    Staff Present [name] RN,BSN;Susanne Bice, RN, BSN, CCRP;Maxon Conetta BS, Exercise Physiologist;Noah Tickle, BS, Exercise Physiologist    Virtual Visit No    Medication changes reported     No    Fall or balance concerns reported    No    Warm-up and Cool-down Performed on first and last piece of equipment    Resistance Training Performed Yes    VAD Patient? No    PAD/SET Patient? No      Pain Assessment   Currently in Pain? No/denies                Social History   Tobacco Use  Smoking Status Never  Smokeless Tobacco Never    Goals Met:  Independence with exercise equipment Exercise tolerated well No report of concerns or symptoms today Strength training completed today  Goals Unmet:  Not Applicable  Comments: First full day of exercise!  Patient was oriented to gym and equipment including functions, settings, policies, and procedures.  Patient's individual exercise prescription and treatment plan were reviewed.  All starting workloads were established based on the results of the 6 minute walk test done at initial orientation visit.  The plan for exercise progression was also introduced and progression will be customized based on patient's performance and goals.    Dr. Firman Hughes is Medical Director for [facility] Cardiac Rehabilitation.  Dr. Fuad Aleskerov is Medical Director for [facility] Pulmonary Rehabilitation.

## 2024-01-29 DIAGNOSIS — Z955 Presence of coronary angioplasty implant and graft: Secondary | ICD-10-CM

## 2024-01-29 NOTE — Progress Notes (Signed)
 Daily Session Note  Patient Details  Name: Chase Jensen MRN: [id number] Date of Birth: October 30, 1965 Referring Provider:   Flowsheet Row Cardiac Rehab from 01/21/2024 in [facility] Cardiac and Pulmonary Rehab  Referring Provider Dr. Nestor Banter       Encounter Date: 01/29/2024  Check In:  Session Check In - 01/29/24 1414       Check-In   Supervising physician immediately available to respond to emergencies See telemetry face sheet for immediately available ER MD    Location [facility]-Cardiac & Pulmonary Rehab    Staff Present [name] RN,BSN;Susanne Bice, RN, BSN, CCRP;Kelly Hayes BS, ACSM CEP, Exercise Physiologist;Noah Tickle, BS, Exercise Physiologist    Virtual Visit No    Medication changes reported     No    Fall or balance concerns reported    No    Warm-up and Cool-down Performed on first and last piece of equipment    Resistance Training Performed Yes    VAD Patient? No      Pain Assessment   Currently in Pain? No/denies                Social History   Tobacco Use  Smoking Status Never  Smokeless Tobacco Never    Goals Met:  Independence with exercise equipment Exercise tolerated well No report of concerns or symptoms today Strength training completed today  Goals Unmet:  Not Applicable  Comments: Pt able to follow exercise prescription today without complaint.  Will continue to monitor for progression.    Dr. Firman Hughes is Medical Director for [facility] Cardiac Rehabilitation.  Dr. Fuad Aleskerov is Medical Director for [facility] Pulmonary Rehabilitation.

## 2024-01-30 ENCOUNTER — Encounter: Payer: Self-pay | Admitting: *Deleted

## 2024-01-30 DIAGNOSIS — Z955 Presence of coronary angioplasty implant and graft: Secondary | ICD-10-CM

## 2024-01-30 NOTE — Progress Notes (Signed)
 Daily Session Note  Patient Details  Name: Chase Jensen MRN: [id number] Date of Birth: 03-Jun-1966 Referring Provider:   Flowsheet Row Cardiac Rehab from 01/21/2024 in [facility] Cardiac and Pulmonary Rehab  Referring Provider Dr. Nestor Banter       Encounter Date: 01/30/2024  Check In:  Session Check In - 01/30/24 1409       Check-In   Supervising physician immediately available to respond to emergencies See telemetry face sheet for immediately available ER MD    Location [facility]-Cardiac & Pulmonary Rehab    Staff Present [name] RN,BSN;Joseph [facility] BS, Exercise Physiologist;Noah Tickle, BS, Exercise Physiologist    Virtual Visit No    Medication changes reported     No    Fall or balance concerns reported    No    Warm-up and Cool-down Performed on first and last piece of equipment    Resistance Training Performed Yes    VAD Patient? No    PAD/SET Patient? No      Pain Assessment   Currently in Pain? No/denies                Social History   Tobacco Use  Smoking Status Never  Smokeless Tobacco Never    Goals Met:  Independence with exercise equipment Exercise tolerated well No report of concerns or symptoms today Strength training completed today  Goals Unmet:  Not Applicable  Comments: Pt able to follow exercise prescription today without complaint.  Will continue to monitor for progression.    Dr. Firman Hughes is Medical Director for [facility] Cardiac Rehabilitation.  Dr. Fuad Aleskerov is Medical Director for [facility] Pulmonary Rehabilitation.

## 2024-02-03 ENCOUNTER — Encounter: Payer: Self-pay | Admitting: *Deleted

## 2024-02-03 DIAGNOSIS — Z955 Presence of coronary angioplasty implant and graft: Secondary | ICD-10-CM

## 2024-02-03 NOTE — Progress Notes (Signed)
 Daily Session Note  Patient Details  Name: Chase Jensen MRN: [id number] Date of Birth: April 11, 1966 Referring Provider:   Flowsheet Row Cardiac Rehab from 01/21/2024 in [facility] Cardiac and Pulmonary Rehab  Referring Provider Dr. Nestor Banter       Encounter Date: 02/03/2024  Check In:  Session Check In - 02/03/24 1356       Check-In   Supervising physician immediately available to respond to emergencies See telemetry face sheet for immediately available ER MD    Location [facility]-Cardiac & Pulmonary Rehab    Staff Present [name] RN,BSN;Susanne Bice, RN, BSN, CCRP;Maxon Conetta BS, Exercise Physiologist;Noah Tickle, BS, Exercise Physiologist    Virtual Visit No    Medication changes reported     No    Fall or balance concerns reported    No    Warm-up and Cool-down Performed on first and last piece of equipment    Resistance Training Performed Yes    VAD Patient? No    PAD/SET Patient? No      Pain Assessment   Currently in Pain? No/denies                Social History   Tobacco Use  Smoking Status Never  Smokeless Tobacco Never    Goals Met:  Independence with exercise equipment Exercise tolerated well No report of concerns or symptoms today Strength training completed today  Goals Unmet:  Not Applicable  Comments: Pt able to follow exercise prescription today without complaint.  Will continue to monitor for progression.    Dr. Firman Hughes is Medical Director for [facility] Cardiac Rehabilitation.  Dr. Fuad Aleskerov is Medical Director for [facility] Pulmonary Rehabilitation.

## 2024-02-05 ENCOUNTER — Encounter: Payer: Self-pay | Admitting: *Deleted

## 2024-02-05 DIAGNOSIS — Z955 Presence of coronary angioplasty implant and graft: Secondary | ICD-10-CM

## 2024-02-05 NOTE — Progress Notes (Signed)
 Daily Session Note  Patient Details  Name: Chase Jensen MRN: [id number] Date of Birth: 08/08/1966 Referring Provider:   Flowsheet Row Cardiac Rehab from 01/21/2024 in [facility] Cardiac and Pulmonary Rehab  Referring Provider Dr. Nestor Banter       Encounter Date: 02/05/2024  Check In:  Session Check In - 02/05/24 1356       Check-In   Supervising physician immediately available to respond to emergencies See telemetry face sheet for immediately available ER MD    Location [facility]-Cardiac & Pulmonary Rehab    Staff Present [name] RN,BSN;Susanne Bice, RN, BSN, CCRP;Joseph Hood RCP,RRT,BSRT;Kelly Briar BS, ACSM CEP, Exercise Physiologist    Virtual Visit No    Medication changes reported     No    Fall or balance concerns reported    No    Warm-up and Cool-down Performed on first and last piece of equipment    Resistance Training Performed Yes    VAD Patient? No    PAD/SET Patient? No      Pain Assessment   Currently in Pain? No/denies                Social History   Tobacco Use  Smoking Status Never  Smokeless Tobacco Never    Goals Met:  Independence with exercise equipment Exercise tolerated well No report of concerns or symptoms today Strength training completed today  Goals Unmet:  Not Applicable  Comments: Pt able to follow exercise prescription today without complaint.  Will continue to monitor for progression.    Dr. Firman Hughes is Medical Director for [facility] Cardiac Rehabilitation.  Dr. Fuad Aleskerov is Medical Director for [facility] Pulmonary Rehabilitation.

## 2024-02-06 ENCOUNTER — Encounter: Payer: Self-pay | Attending: Cardiovascular Disease

## 2024-02-06 DIAGNOSIS — Z955 Presence of coronary angioplasty implant and graft: Secondary | ICD-10-CM | POA: Insufficient documentation

## 2024-02-06 NOTE — Progress Notes (Signed)
 Daily Session Note  Patient Details  Name: Chase Jensen MRN: [id number] Date of Birth: [birth year] Referring Provider:   Flowsheet Row Cardiac Rehab from 01/21/2024 in [facility] Cardiac and Pulmonary Rehab  Referring Provider Dr. Nestor Banter       Encounter Date: 02/06/2024  Check In:  Session Check In - 02/06/24 1347       Check-In   Supervising physician immediately available to respond to emergencies See telemetry face sheet for immediately available ER MD    Location [facility]-Cardiac & Pulmonary Rehab    Staff Present [name] RN,BSN;Joseph [facility] BS, Exercise Physiologist;Noah Tickle, BS, Exercise Physiologist    Virtual Visit No    Medication changes reported     No    Fall or balance concerns reported    No    Warm-up and Cool-down Performed on first and last piece of equipment    Resistance Training Performed Yes    VAD Patient? No    PAD/SET Patient? No      Pain Assessment   Currently in Pain? No/denies                Social History   Tobacco Use  Smoking Status Never  Smokeless Tobacco Never    Goals Met:  Independence with exercise equipment Exercise tolerated well No report of concerns or symptoms today Strength training completed today  Goals Unmet:  Not Applicable  Comments: Pt able to follow exercise prescription today without complaint.  Will continue to monitor for progression.    Dr. Firman Hughes is Medical Director for [facility] Cardiac Rehabilitation.  Dr. Fuad Aleskerov is Medical Director for [facility] Pulmonary Rehabilitation.

## 2024-02-10 ENCOUNTER — Encounter: Payer: Self-pay | Admitting: *Deleted

## 2024-02-10 DIAGNOSIS — Z955 Presence of coronary angioplasty implant and graft: Secondary | ICD-10-CM

## 2024-02-10 NOTE — Progress Notes (Signed)
 Daily Session Note  Patient Details  Name: Chase Jensen MRN: [id number] Date of Birth: Jul 18, 1966 Referring Provider:   Flowsheet Row Cardiac Rehab from 01/21/2024 in [facility] Cardiac and Pulmonary Rehab  Referring Provider Dr. Nestor Banter       Encounter Date: 02/10/2024  Check In:  Session Check In - 02/10/24 1358       Check-In   Supervising physician immediately available to respond to emergencies See telemetry face sheet for immediately available ER MD    Location [facility]-Cardiac & Pulmonary Rehab    Staff Present [name] RN,BSN;Margaret Best, MS, Exercise Physiologist;Maxon Conetta BS, Exercise Physiologist;Noah Tickle, BS, Exercise Physiologist    Virtual Visit No    Medication changes reported     No    Fall or balance concerns reported    No    Warm-up and Cool-down Performed on first and last piece of equipment    Resistance Training Performed Yes    VAD Patient? No    PAD/SET Patient? No      Pain Assessment   Currently in Pain? No/denies                Social History   Tobacco Use  Smoking Status Never  Smokeless Tobacco Never    Goals Met:  Independence with exercise equipment Exercise tolerated well No report of concerns or symptoms today Strength training completed today  Goals Unmet:  Not Applicable  Comments: Pt able to follow exercise prescription today without complaint.  Will continue to monitor for progression.    Dr. Firman Hughes is Medical Director for [facility] Cardiac Rehabilitation.  Dr. Fuad Aleskerov is Medical Director for [facility] Pulmonary Rehabilitation.

## 2024-02-12 DIAGNOSIS — Z955 Presence of coronary angioplasty implant and graft: Secondary | ICD-10-CM

## 2024-02-12 NOTE — Progress Notes (Signed)
 Cardiac Individual Treatment Plan  Patient Details  Name: Chase Jensen MRN: [id number] Date of Birth: 09-10-1966 Referring Provider:   Flowsheet Row Cardiac Rehab from 01/21/2024 in [facility] Cardiac and Pulmonary Rehab  Referring Provider Dr. Nestor Banter       Initial Encounter Date:  Flowsheet Row Cardiac Rehab from 01/21/2024 in [facility] Cardiac and Pulmonary Rehab  Date 01/21/24       Visit Diagnosis: Status post coronary artery stent placement  Patient's Home Medications on Admission:  Current Outpatient Medications:    Ascorbic Acid (VITAMIN C PO), Take by mouth daily., Disp: , Rfl:    aspirin  81 MG EC tablet, Take 81 mg by mouth daily. Swallow whole., Disp: , Rfl:    atorvastatin  (LIPITOR) 80 MG tablet, Take 1 tablet (80 mg total) by mouth daily., Disp: 90 tablet, Rfl: 3   clopidogrel  (PLAVIX ) 75 MG tablet, Take 1 tablet (75 mg total) by mouth daily., Disp: 90 tablet, Rfl: 2   fluticasone (FLONASE) 50 MCG/ACT nasal spray, Place into the nose as needed. (Patient not taking: Reported on 01/14/2024), Disp: , Rfl:    levothyroxine  (SYNTHROID ) 150 MCG tablet, Take 150 mcg by mouth daily before breakfast., Disp: , Rfl:    metoprolol  succinate (TOPROL  XL) 25 MG 24 hr tablet, Take 0.5 tablets (12.5 mg total) by mouth at bedtime., Disp: 45 tablet, Rfl: 3   multivitamin-lutein  (OCUVITE-LUTEIN ) CAPS capsule, Take 1 capsule by mouth daily., Disp: , Rfl:    nitroGLYCERIN  (NITROSTAT ) 0.4 MG SL tablet, Place 1 tablet (0.4 mg total) under the tongue every 5 (five) minutes as needed for chest pain. (Patient not taking: Reported on 01/14/2024), Disp: 25 tablet, Rfl: 2   SUMAtriptan (IMITREX) 100 MG tablet, Take 100 mg by mouth as needed., Disp: , Rfl:   Past Medical History: Past Medical History:  Diagnosis Date   Coronary artery disease 2006    balloon angioplasty without stent placement to one-vessel done at [facility] in [place]     Hyperlipemia    Hypertension    Hypothyroidism      Tobacco Use: Social History   Tobacco Use  Smoking Status Never  Smokeless Tobacco Never    Labs: Review Flowsheet       Latest Ref Rng & Units 08/07/2019 11/21/2023  Labs for ITP Cardiac and Pulmonary Rehab  Cholestrol 100 - 199 mg/dL 829  562   LDL (calc) 0 - 99 mg/dL 51  85   HDL-C >13 mg/dL 47  48   Trlycerides 0 - 149 mg/dL 87  086      Exercise Target Goals: Exercise Program Goal: Individual exercise prescription set using results from initial 6 min walk test and THRR while considering  patient's activity barriers and safety.   Exercise Prescription Goal: Initial exercise prescription builds to 30-45 minutes a day of aerobic activity, 2-3 days per week.  Home exercise guidelines will be given to patient during program as part of exercise prescription that the participant will acknowledge.   Education: Aerobic Exercise: - Group verbal and visual presentation on the components of exercise prescription. Introduces F.I.T.T principle from ACSM for exercise prescriptions.  Reviews F.I.T.T. principles of aerobic exercise including progression. Written material given at graduation.   Education: Resistance Exercise: - Group verbal and visual presentation on the components of exercise prescription. Introduces F.I.T.T principle from ACSM for exercise prescriptions  Reviews F.I.T.T. principles of resistance exercise including progression. Written material given at graduation.    Education: Exercise & Equipment Safety: - Individual  verbal instruction and demonstration of equipment use and safety with use of the equipment. Flowsheet Row Cardiac Rehab from 01/29/2024 in [facility] Cardiac and Pulmonary Rehab  Date 01/21/24  Educator [facility]  Instruction Review Code 1- Verbalizes Understanding       Education: Exercise Physiology & General Exercise Guidelines: - Group verbal and written instruction with models to review the exercise physiology of the cardiovascular system and  associated critical values. Provides general exercise guidelines with specific guidelines to those with heart or lung disease.    Education: Flexibility, Balance, Mind/Body Relaxation: - Group verbal and visual presentation with interactive activity on the components of exercise prescription. Introduces F.I.T.T principle from ACSM for exercise prescriptions. Reviews F.I.T.T. principles of flexibility and balance exercise training including progression. Also discusses the mind body connection.  Reviews various relaxation techniques to help reduce and manage stress (i.e. Deep breathing, progressive muscle relaxation, and visualization). Balance handout provided to take home. Written material given at graduation.   Activity Barriers & Risk Stratification:  Activity Barriers & Cardiac Risk Stratification - 01/02/24 1345       Activity Barriers & Cardiac Risk Stratification   Activity Barriers None    Cardiac Risk Stratification Moderate             6 Minute Walk:  6 Minute Walk     Row Name 01/21/24 1457         6 Minute Walk   Phase Initial     Distance 1550 feet     Walk Time 6 minutes     # of Rest Breaks 0     MPH 2.9     METS 3.8     RPE 9     Perceived Dyspnea  0     Symptoms No     Resting HR 71 bpm     Resting BP 116/62     Resting Oxygen Saturation  96 %     Exercise Oxygen Saturation  during 6 min walk 98 %     Max Ex. HR 88 bpm     Max Ex. BP 126/64     2 Minute Post BP 122/62              Oxygen Initial Assessment:   Oxygen Re-Evaluation:   Oxygen Discharge (Final Oxygen Re-Evaluation):   Initial Exercise Prescription:  Initial Exercise Prescription - 01/21/24 1400       Date of Initial Exercise RX and Referring Provider   Date 01/21/24    Referring Provider Dr. Nestor Banter      Oxygen   Maintain Oxygen Saturation 88% or higher      Treadmill   MPH 2.8    Grade 1    Minutes 15    METs 3.53      Elliptical   Level 1    Speed 3     Minutes 15    METs 3.76      REL-XR   Level 3    Watts 25    Speed 50    Minutes 15    METs 3.76      Rower   Level 1    Watts 25    Minutes 15    METs 3.76      Intensity   THRR 40-80% of Max Heartrate 100-130    Ratings of Perceived Exertion 11-13    Perceived Dyspnea 0-4      Resistance Training   Training Prescription Yes    Weight 8lb  Reps 10-15             Perform Capillary Blood Glucose checks as needed.  Exercise Prescription Changes:   Exercise Prescription Changes     Row Name 01/21/24 1500 02/11/24 1500           Response to Exercise   Blood Pressure (Admit) 116/62 134/62      Blood Pressure (Exercise) 126/64 162/60      Blood Pressure (Exit) 122/62 104/60      Heart Rate (Admit) 71 bpm 81 bpm      Heart Rate (Exercise) 88 bpm 135 bpm      Heart Rate (Exit) 67 bpm 84 bpm      Oxygen Saturation (Admit) 96 % --      Oxygen Saturation (Exercise) 99 % --      Oxygen Saturation (Exit) 99 % --      Rating of Perceived Exertion (Exercise) 9 13      Perceived Dyspnea (Exercise) 0 --      Symptoms none none      Comments results First two weeks of exercise'      Duration -- Continue with 30 min of aerobic exercise without signs/symptoms of physical distress.      Intensity -- THRR unchanged        Progression   Progression -- Continue to progress workloads to maintain intensity without signs/symptoms of physical distress.      Average METs -- 3.83        Resistance Training   Training Prescription -- Yes      Weight -- 8 lb      Reps -- 10-15        Interval Training   Interval Training -- No        Treadmill   MPH -- 3      Grade -- 3      Minutes -- 15      METs -- 4.54        Elliptical   Level -- 2      Speed -- 3.4      Minutes -- 15      METs -- 4.1        Oxygen   Maintain Oxygen Saturation -- 88% or higher               Exercise Comments:   Exercise Comments     Row Name 01/27/24 1355            Exercise Comments First full day of exercise!  Patient was oriented to gym and equipment including functions, settings, policies, and procedures.  Patient's individual exercise prescription and treatment plan were reviewed.  All starting workloads were established based on the results of the 6 minute walk test done at initial orientation visit.  The plan for exercise progression was also introduced and progression will be customized based on patient's performance and goals.                Exercise Goals and Review:   Exercise Goals     Row Name 01/21/24 1502             Exercise Goals   Increase Physical Activity Yes       Intervention Provide advice, education, support and counseling about physical activity/exercise needs.;Develop an individualized exercise prescription for aerobic and resistive training based on initial evaluation findings, risk stratification, comorbidities and participant's personal goals.       Expected Outcomes Short  Term: Attend rehab on a regular basis to increase amount of physical activity.;Long Term: Add in home exercise to make exercise part of routine and to increase amount of physical activity.;Long Term: Exercising regularly at least 3-5 days a week.       Increase Strength and Stamina Yes       Intervention Provide advice, education, support and counseling about physical activity/exercise needs.;Develop an individualized exercise prescription for aerobic and resistive training based on initial evaluation findings, risk stratification, comorbidities and participant's personal goals.       Expected Outcomes Short Term: Increase workloads from initial exercise prescription for resistance, speed, and METs.;Short Term: Perform resistance training exercises routinely during rehab and add in resistance training at home;Long Term: Improve cardiorespiratory fitness, muscular endurance and strength as measured by increased METs and functional capacity ( )       Able  to understand and use rate of perceived exertion (RPE) scale Yes       Intervention Provide education and explanation on how to use RPE scale       Expected Outcomes Short Term: Able to use RPE daily in rehab to express subjective intensity level;Long Term:  Able to use RPE to guide intensity level when exercising independently       Able to understand and use Dyspnea scale Yes       Intervention Provide education and explanation on how to use Dyspnea scale       Expected Outcomes Short Term: Able to use Dyspnea scale daily in rehab to express subjective sense of shortness of breath during exertion;Long Term: Able to use Dyspnea scale to guide intensity level when exercising independently       Knowledge and understanding of Target Heart Rate Range (THRR) Yes       Intervention Provide education and explanation of THRR including how the numbers were predicted and where they are located for reference       Expected Outcomes Short Term: Able to state/look up THRR;Short Term: Able to use daily as guideline for intensity in rehab;Long Term: Able to use THRR to govern intensity when exercising independently       Able to check pulse independently Yes       Intervention Provide education and demonstration on how to check pulse in carotid and radial arteries.;Review the importance of being able to check your own pulse for safety during independent exercise       Expected Outcomes Short Term: Able to explain why pulse checking is important during independent exercise;Long Term: Able to check pulse independently and accurately       Understanding of Exercise Prescription Yes       Intervention Provide education, explanation, and written materials on patient's individual exercise prescription       Expected Outcomes Short Term: Able to explain program exercise prescription;Long Term: Able to explain home exercise prescription to exercise independently                Exercise Goals Re-Evaluation :   Exercise Goals Re-Evaluation     Row Name 01/27/24 1355 02/11/24 1521           Exercise Goal Re-Evaluation   Exercise Goals Review Able to understand and use rate of perceived exertion (RPE) scale;Increase Physical Activity;Understanding of Exercise Prescription;Increase Strength and Stamina;Able to check pulse independently;Knowledge and understanding of Target Heart Rate Range (THRR) Increase Physical Activity;Increase Strength and Stamina;Understanding of Exercise Prescription      Comments Reviewed RPE and dyspnea scale, THR  and program prescription with pt today.  Pt voiced understanding and was given a copy of goals to take home. Keyaun is off to a good start in the program. During his first two weeks in rehab he was able to increase his treadmill workload to a speed of 3 mph with an incline of 3%. He also improved to level 2 on the elliptical at a speed of 3.4 mph. We will continue to monitor his progress in the program.      Expected Outcomes Short: Use RPE daily to regulate intensity.  Long: Follow program prescription in [facility]. --               Discharge Exercise Prescription (Final Exercise Prescription Changes):  Exercise Prescription Changes - 02/11/24 1500       Response to Exercise   Blood Pressure (Admit) 134/62    Blood Pressure (Exercise) 162/60    Blood Pressure (Exit) 104/60    Heart Rate (Admit) 81 bpm    Heart Rate (Exercise) 135 bpm    Heart Rate (Exit) 84 bpm    Rating of Perceived Exertion (Exercise) 13    Symptoms none    Comments First two weeks of exercise'    Duration Continue with 30 min of aerobic exercise without signs/symptoms of physical distress.    Intensity THRR unchanged      Progression   Progression Continue to progress workloads to maintain intensity without signs/symptoms of physical distress.    Average METs 3.83      Resistance Training   Training Prescription Yes    Weight 8 lb    Reps 10-15      Interval Training   Interval  Training No      Treadmill   MPH 3    Grade 3    Minutes 15    METs 4.54      Elliptical   Level 2    Speed 3.4    Minutes 15    METs 4.1      Oxygen   Maintain Oxygen Saturation 88% or higher             Nutrition:  Target Goals: Understanding of nutrition guidelines, daily intake of sodium 1500mg , cholesterol 200mg , calories 30% from fat and 7% or less from saturated fats, daily to have 5 or more servings of fruits and vegetables.  Education: All About Nutrition: -Group instruction provided by verbal, written material, interactive activities, discussions, models, and posters to present general guidelines for heart healthy nutrition including fat, fiber, MyPlate, the role of sodium in heart healthy nutrition, utilization of the nutrition label, and utilization of this knowledge for meal planning. Follow up email sent as well. Written material given at graduation. Flowsheet Row Cardiac Rehab from 01/29/2024 in [facility] Cardiac and Pulmonary Rehab  Education need identified 01/21/24       Biometrics:  Pre Biometrics - 01/21/24 1503       Pre Biometrics   Height 6' (1.829 m)    Weight 205 lb 1.6 oz (93 kg)    Waist Circumference 38.5 inches    Hip Circumference 39.5 inches    Waist to Hip Ratio 0.97 %    BMI (Calculated) 27.81    Single Leg Stand 30 seconds              Nutrition Therapy Plan and Nutrition Goals:   Nutrition Assessments:  MEDIFICTS Score Key: >=70 Need to make dietary changes  40-70 Heart Healthy Diet <= 40 Therapeutic Level  Cholesterol Diet  Flowsheet Row Cardiac Rehab from 01/21/2024 in [facility] Cardiac and Pulmonary Rehab  Picture Your Plate Total Score on Admission 70      Picture Your Plate Scores: <16 Unhealthy dietary pattern with much room for improvement. 41-50 Dietary pattern unlikely to meet recommendations for good health and room for improvement. 51-60 More healthful dietary pattern, with some room for improvement.  >60  Healthy dietary pattern, although there may be some specific behaviors that could be improved.    Nutrition Goals Re-Evaluation:   Nutrition Goals Discharge (Final Nutrition Goals Re-Evaluation):   Psychosocial: Target Goals: Acknowledge presence or absence of significant depression and/or stress, maximize coping skills, provide positive support system. Participant is able to verbalize types and ability to use techniques and skills needed for reducing stress and depression.   Education: Stress, Anxiety, and Depression - Group verbal and visual presentation to define topics covered.  Reviews how body is impacted by stress, anxiety, and depression.  Also discusses healthy ways to reduce stress and to treat/manage anxiety and depression.  Written material given at graduation.   Education: Sleep Hygiene -Provides group verbal and written instruction about how sleep can affect your health.  Define sleep hygiene, discuss sleep cycles and impact of sleep habits. Review good sleep hygiene tips.    Initial Review & Psychosocial Screening:  Initial Psych Review & Screening - 01/02/24 1345       Initial Review   Current issues with None Identified      Family Dynamics   Good Support System? Yes   wife,   daughter 3 hours away, 2 sons out of state.     Barriers   Psychosocial barriers to participate in program There are no identifiable barriers or psychosocial needs.      Screening Interventions   Interventions To provide support and resources with identified psychosocial needs;Provide feedback about the scores to participant    Expected Outcomes Short Term goal: Utilizing psychosocial counselor, staff and physician to assist with identification of specific Stressors or current issues interfering with healing process. Setting desired goal for each stressor or current issue identified.;Long Term Goal: Stressors or current issues are controlled or eliminated.;Short Term goal: Identification and  review with participant of any Quality of Life or Depression concerns found by scoring the questionnaire.;Long Term goal: The participant improves quality of Life and PHQ9 Scores as seen by post scores and/or verbalization of changes             Quality of Life Scores:   Quality of Life - 01/21/24 1503       Quality of Life   Select Quality of Life      Quality of Life Scores   Health/Function Pre 24.3 %    Socioeconomic Pre 22.88 %    Psych/Spiritual Pre 24.64 %    Family Pre 25.2 %    GLOBAL Pre 24.17 %            Scores of 19 and below usually indicate a poorer quality of life in these areas.  A difference of  2-3 points is a clinically meaningful difference.  A difference of 2-3 points in the total score of the Quality of Life Index has been associated with significant improvement in overall quality of life, self-image, physical symptoms, and general health in studies assessing change in quality of life.  PHQ-9: Review Flowsheet       01/21/2024  Depression screen PHQ 2/9  Decreased Interest 0  Down, Depressed, Hopeless  0  PHQ - 2 Score 0  Altered sleeping 0  Tired, decreased energy 1  Change in appetite 0  Feeling bad or failure about yourself  0  Trouble concentrating 0  Moving slowly or fidgety/restless 0  Suicidal thoughts 0  PHQ-9 Score 1  Difficult doing work/chores Not difficult at all   Interpretation of Total Score  Total Score Depression Severity:  1-4 = Minimal depression, 5-9 = Mild depression, 10-14 = Moderate depression, 15-19 = Moderately severe depression, 20-27 = Severe depression   Psychosocial Evaluation and Intervention:  Psychosocial Evaluation - 01/02/24 1357       Psychosocial Evaluation & Interventions   Interventions Encouraged to exercise with the program and follow exercise prescription    Comments There are no barriers to attending the program.  He is ready to  work improving his lifestyle to be able tomanage his heart disease.   He has been on a weight loss journey since last Noverber,; loosing almost 30 lbs to today.  HAs a goal of a few more pounds to lose. He lives with his wife and she and their family are his support.   a daughter lives 3 hours away and 2 sons live [place] state. He had to delay his start date because his wife's father was disgnosed with pancreatic cancer and they are going to [place]  to see him and her mother (who has dementia). He is ready to start the program once he returns home.    Expected Outcomes STG attend all scheduled sessions, continue weight loss to goal, follow exercise guidelines working on exercise progression.   LTG able to continue on weight loss/maintainence and exercise progression after discharge    Continue Psychosocial Services  Follow up required by staff             Psychosocial Re-Evaluation:   Psychosocial Discharge (Final Psychosocial Re-Evaluation):   Vocational Rehabilitation: Provide vocational rehab assistance to qualifying candidates.   Vocational Rehab Evaluation & Intervention:   Education: Education Goals: Education classes will be provided on a variety of topics geared toward better understanding of heart health and risk factor modification. Participant will state understanding/return demonstration of topics presented as noted by education test scores.  Learning Barriers/Preferences:   General Cardiac Education Topics:  AED/CPR: - Group verbal and written instruction with the use of models to demonstrate the basic use of the AED with the basic ABC's of resuscitation.   Anatomy and Cardiac Procedures: - Group verbal and visual presentation and models provide information about basic cardiac anatomy and function. Reviews the testing methods done to diagnose heart disease and the outcomes of the test results. Describes the treatment choices: Medical Management, Angioplasty, or Coronary Bypass Surgery for treating various heart conditions including  Myocardial Infarction, Angina, Valve Disease, and Cardiac Arrhythmias.  Written material given at graduation.   Medication Safety: - Group verbal and visual instruction to review commonly prescribed medications for heart and lung disease. Reviews the medication, class of the drug, and side effects. Includes the steps to properly store meds and maintain the prescription regimen.  Written material given at graduation.   Intimacy: - Group verbal instruction through game format to discuss how heart and lung disease can affect sexual intimacy. Written material given at graduation..   Know Your Numbers and Heart Failure: - Group verbal and visual instruction to discuss disease risk factors for cardiac and pulmonary disease and treatment options.  Reviews associated critical values for Overweight/Obesity, Hypertension, Cholesterol, and Diabetes.  Discusses basics  of heart failure: signs/symptoms and treatments.  Introduces Heart Failure Zone chart for action plan for heart failure.  Written material given at graduation. Flowsheet Row Cardiac Rehab from 01/29/2024 in [facility] Cardiac and Pulmonary Rehab  Education need identified 01/21/24  Date 01/29/24  Educator SB  Instruction Review Code 1- Verbalizes Understanding       Infection Prevention: - Provides verbal and written material to individual with discussion of infection control including proper hand washing and proper equipment cleaning during exercise session. Flowsheet Row Cardiac Rehab from 01/29/2024 in [facility] Cardiac and Pulmonary Rehab  Date 01/21/24  Educator [facility]  Instruction Review Code 1- Verbalizes Understanding       Falls Prevention: - Provides verbal and written material to individual with discussion of falls prevention and safety. Flowsheet Row Cardiac Rehab from 01/29/2024 in [facility] Cardiac and Pulmonary Rehab  Date 01/21/24  Educator [facility]  Instruction Review Code 1- Verbalizes Understanding       Other: -Provides group and  verbal instruction on various topics (see comments)   Knowledge Questionnaire Score:  Knowledge Questionnaire Score - 01/21/24 1505       Knowledge Questionnaire Score   Pre Score 23/26             Core Components/Risk Factors/Patient Goals at Admission:  Personal Goals and Risk Factors at Admission - 01/21/24 1505       Core Components/Risk Factors/Patient Goals on Admission    Weight Management Yes    Intervention Weight Management: Develop a combined nutrition and exercise program designed to reach desired caloric intake, while maintaining appropriate intake of nutrient and fiber, sodium and fats, and appropriate energy expenditure required for the weight goal.;Weight Management: Provide education and appropriate resources to help participant work on and attain dietary goals.    Admit Weight 205 lb 1.6 oz (93 kg)    Goal Weight: Short Term 200 lb (90.7 kg)    Goal Weight: Long Term 199 lb (90.3 kg)    Expected Outcomes Short Term: Continue to assess and modify interventions until short term weight is achieved;Long Term: Adherence to nutrition and physical activity/exercise program aimed toward attainment of established weight goal;Weight Loss: Understanding of general recommendations for a balanced deficit meal plan, which promotes 1-2 lb weight loss per week and includes a negative energy balance of [phone] kcal/d    Hypertension Yes    Intervention Provide education on lifestyle modifcations including regular physical activity/exercise, weight management, moderate sodium restriction and increased consumption of fresh fruit, vegetables, and low fat dairy, alcohol moderation, and smoking cessation.;Monitor prescription use compliance.    Expected Outcomes Short Term: Continued assessment and intervention until BP is < 140/52mm HG in hypertensive participants. < 130/35mm HG in hypertensive participants with diabetes, heart failure or chronic kidney disease.;Long Term: Maintenance of  blood pressure at goal levels.    Lipids Yes    Intervention Provide education and support for participant on nutrition & aerobic/resistive exercise along with prescribed medications to achieve LDL 70mg , HDL >40mg .    Expected Outcomes Short Term: Participant states understanding of desired cholesterol values and is compliant with medications prescribed. Participant is following exercise prescription and nutrition guidelines.;Long Term: Cholesterol controlled with medications as prescribed, with individualized exercise RX and with personalized nutrition plan. Value goals: LDL < 70mg , HDL > 40 mg.             Education:Diabetes - Individual verbal and written instruction to review signs/symptoms of diabetes, desired ranges of glucose level fasting, after meals and  with exercise. Acknowledge that pre and post exercise glucose checks will be done for 3 sessions at entry of program.   Core Components/Risk Factors/Patient Goals Review:    Core Components/Risk Factors/Patient Goals at Discharge (Final Review):    ITP Comments:  ITP Comments     Row Name 01/02/24 1426 01/21/24 1456 01/27/24 1355 02/12/24 1312     ITP Comments Virtual orientation call completed today. he has an appointment on Date: 01/21/2024  for EP eval and gym Orientation.  Documentation of diagnosis can be found in [facility] Date: 12/18/2023 . Completed and gym orientation for cardiac rehab. Initial ITP created and sent for review to Dr. Firman Hughes, Medical Director. First full day of exercise!  Patient was oriented to gym and equipment including functions, settings, policies, and procedures.  Patient's individual exercise prescription and treatment plan were reviewed.  All starting workloads were established based on the results of the 6 minute walk test done at initial orientation visit.  The plan for exercise progression was also introduced and progression will be customized based on patient's performance and goals. 30 Day  review completed. Medical Director ITP review done, changes made as directed, and signed approval by Medical Director. New to program.             Comments: 30 day review

## 2024-02-12 NOTE — Progress Notes (Signed)
 30 Day review completed. Medical Director ITP review done, changes made as directed, and signed approval by Medical Director. New to program.

## 2024-02-13 ENCOUNTER — Encounter: Payer: Self-pay | Admitting: *Deleted

## 2024-02-13 DIAGNOSIS — Z955 Presence of coronary angioplasty implant and graft: Secondary | ICD-10-CM

## 2024-02-13 NOTE — Progress Notes (Signed)
 Daily Session Note  Patient Details  Name: Chase Jensen MRN: [id number] Date of Birth: June 10, 1966 Referring Provider:   Flowsheet Row Cardiac Rehab from 01/21/2024 in [facility] Cardiac and Pulmonary Rehab  Referring Provider Dr. Nestor Banter       Encounter Date: 02/13/2024  Check In:  Session Check In - 02/13/24 1402       Check-In   Supervising physician immediately available to respond to emergencies See telemetry face sheet for immediately available ER MD    Location [facility]-Cardiac & Pulmonary Rehab    Staff Present [name] RN,BSN;Joseph [facility] BS, Exercise Physiologist;Noah Tickle, BS, Exercise Physiologist    Virtual Visit No    Medication changes reported     No    Fall or balance concerns reported    No    Warm-up and Cool-down Performed on first and last piece of equipment    Resistance Training Performed Yes    VAD Patient? No    PAD/SET Patient? No      Pain Assessment   Currently in Pain? No/denies                Social History   Tobacco Use  Smoking Status Never  Smokeless Tobacco Never    Goals Met:  Independence with exercise equipment Exercise tolerated well No report of concerns or symptoms today Strength training completed today  Goals Unmet:  Not Applicable  Comments: Pt able to follow exercise prescription today without complaint.  Will continue to monitor for progression.    Dr. Firman Hughes is Medical Director for [facility] Cardiac Rehabilitation.  Dr. Fuad Aleskerov is Medical Director for [facility] Pulmonary Rehabilitation.

## 2024-02-14 ENCOUNTER — Telehealth: Payer: Self-pay | Admitting: Physician Assistant

## 2024-02-14 ENCOUNTER — Other Ambulatory Visit: Payer: Self-pay | Admitting: Physician Assistant

## 2024-02-14 NOTE — Telephone Encounter (Signed)
 Can you please print out a prescription/order for cardiac rehab that he can submit to insurance?  The start date needs to be backdated to 01/02/2024 (Date he started cardiac rehab).  If we cannot complete this in Epic, may need to use a paper prescription.

## 2024-02-14 NOTE — Telephone Encounter (Signed)
 Please call in Imdur 15 mg daily.

## 2024-02-14 NOTE — Progress Notes (Signed)
 Error

## 2024-02-17 ENCOUNTER — Encounter: Payer: Self-pay | Admitting: *Deleted

## 2024-02-17 ENCOUNTER — Other Ambulatory Visit: Payer: Self-pay | Admitting: Emergency Medicine

## 2024-02-17 DIAGNOSIS — Z955 Presence of coronary angioplasty implant and graft: Secondary | ICD-10-CM

## 2024-02-17 MED ORDER — ISOSORBIDE MONONITRATE ER 30 MG PO TB24: 15.0000 mg | ORAL_TABLET | Freq: Every day | ORAL | 3 refills | Status: DC

## 2024-02-17 NOTE — Progress Notes (Signed)
 Daily Session Note  Patient Details  Name: Chase Jensen MRN: [id number] Date of Birth: [birth year] Referring Provider:   Flowsheet Row Cardiac Rehab from 01/21/2024 in [facility] Cardiac and Pulmonary Rehab  Referring Provider Dr. Nestor Banter       Encounter Date: 02/17/2024  Check In:  Session Check In - 02/17/24 1359       Check-In   Supervising physician immediately available to respond to emergencies See telemetry face sheet for immediately available ER MD    Location [facility]-Cardiac & Pulmonary Rehab    Staff Present [name] RN,BSN;Margaret Best, MS, Exercise Physiologist;Maxon Conetta BS, Exercise Physiologist;Noah Tickle, BS, Exercise Physiologist    Virtual Visit No    Medication changes reported     No    Fall or balance concerns reported    No    Warm-up and Cool-down Performed on first and last piece of equipment    Resistance Training Performed Yes    VAD Patient? No    PAD/SET Patient? No      Pain Assessment   Currently in Pain? No/denies                Social History   Tobacco Use  Smoking Status Never  Smokeless Tobacco Never    Goals Met:  Independence with exercise equipment Exercise tolerated well No report of concerns or symptoms today Strength training completed today  Goals Unmet:  Not Applicable  Comments: Pt able to follow exercise prescription today without complaint.  Will continue to monitor for progression.    Dr. Firman Hughes is Medical Director for [facility] Cardiac Rehabilitation.  Dr. Fuad Aleskerov is Medical Director for [facility] Pulmonary Rehabilitation.

## 2024-02-19 ENCOUNTER — Encounter: Payer: Self-pay | Admitting: *Deleted

## 2024-02-19 DIAGNOSIS — Z955 Presence of coronary angioplasty implant and graft: Secondary | ICD-10-CM

## 2024-02-19 NOTE — Progress Notes (Signed)
 Daily Session Note  Patient Details  Name: MYKHAEL SWASEY MRN: [id number] Date of Birth: June 22, 1966 Referring Provider:   Flowsheet Row Cardiac Rehab from 01/21/2024 in [facility] Cardiac and Pulmonary Rehab  Referring Provider Dr. Nestor Banter       Encounter Date: 02/19/2024  Check In:  Session Check In - 02/19/24 1358       Check-In   Supervising physician immediately available to respond to emergencies See telemetry face sheet for immediately available ER MD    Location [facility]-Cardiac & Pulmonary Rehab    Staff Present [name] RN,BSN;Susanne Bice, RN, BSN, CCRP;Kelly Hayes BS, ACSM CEP, Exercise Physiologist;Noah Tickle, BS, Exercise Physiologist    Virtual Visit No    Medication changes reported     Yes    Comments added metoprolol     Fall or balance concerns reported    No    Warm-up and Cool-down Performed on first and last piece of equipment    Resistance Training Performed Yes    VAD Patient? No      Pain Assessment   Currently in Pain? No/denies                Social History   Tobacco Use  Smoking Status Never  Smokeless Tobacco Never    Goals Met:  Independence with exercise equipment Exercise tolerated well No report of concerns or symptoms today Strength training completed today  Goals Unmet:  Not Applicable  Comments: Pt able to follow exercise prescription today without complaint.  Will continue to monitor for progression.    Dr. Firman Hughes is Medical Director for [facility] Cardiac Rehabilitation.  Dr. Fuad Aleskerov is Medical Director for [facility] Pulmonary Rehabilitation.

## 2024-02-20 ENCOUNTER — Encounter: Payer: Self-pay | Admitting: *Deleted

## 2024-02-20 DIAGNOSIS — Z955 Presence of coronary angioplasty implant and graft: Secondary | ICD-10-CM

## 2024-02-20 NOTE — Progress Notes (Signed)
 Daily Session Note  Patient Details  Name: Chase Jensen MRN: [id number] Date of Birth: [birth year] Referring Provider:   Flowsheet Row Cardiac Rehab from 01/21/2024 in [facility] Cardiac and Pulmonary Rehab  Referring Provider Dr. Nestor Banter       Encounter Date: 02/20/2024  Check In:  Session Check In - 02/20/24 1358       Check-In   Supervising physician immediately available to respond to emergencies See telemetry face sheet for immediately available ER MD    Location [facility]-Cardiac & Pulmonary Rehab    Staff Present [name] RN,BSN;Joseph [facility] BS, Exercise Physiologist;Noah Tickle, BS, Exercise Physiologist    Virtual Visit No    Medication changes reported     No    Fall or balance concerns reported    No    Warm-up and Cool-down Performed on first and last piece of equipment    Resistance Training Performed Yes    VAD Patient? No    PAD/SET Patient? No      Pain Assessment   Currently in Pain? No/denies                Social History   Tobacco Use  Smoking Status Never  Smokeless Tobacco Never    Goals Met:  Independence with exercise equipment Exercise tolerated well No report of concerns or symptoms today Strength training completed today  Goals Unmet:  Not Applicable  Comments:Pt able to follow exercise prescription today without complaint.  Will continue to monitor for progression.     Dr. Firman Hughes is Medical Director for [facility] Cardiac Rehabilitation.  Dr. Fuad Aleskerov is Medical Director for [facility] Pulmonary Rehabilitation.

## 2024-02-21 ENCOUNTER — Ambulatory Visit: Payer: Self-pay | Attending: Physician Assistant | Admitting: Physician Assistant

## 2024-02-21 ENCOUNTER — Encounter: Payer: Self-pay | Admitting: Physician Assistant

## 2024-02-21 VITALS — BP 142/62 | HR 58 | Ht 72.0 in | Wt 205.6 lb

## 2024-02-21 DIAGNOSIS — I25118 Atherosclerotic heart disease of native coronary artery with other forms of angina pectoris: Secondary | ICD-10-CM

## 2024-02-21 DIAGNOSIS — I10 Essential (primary) hypertension: Secondary | ICD-10-CM

## 2024-02-21 DIAGNOSIS — E785 Hyperlipidemia, unspecified: Secondary | ICD-10-CM

## 2024-02-21 DIAGNOSIS — I77810 Thoracic aortic ectasia: Secondary | ICD-10-CM

## 2024-02-21 DIAGNOSIS — I493 Ventricular premature depolarization: Secondary | ICD-10-CM

## 2024-02-21 DIAGNOSIS — Z789 Other specified health status: Secondary | ICD-10-CM

## 2024-02-21 NOTE — Patient Instructions (Signed)
 Medication Instructions:  Your physician recommends the following medication changes.  STOP TAKING: IMDUR  *If you need a refill on your cardiac medications before your next appointment, please call your pharmacy*  Lab Work: Your provider would like for you to have following labs drawn today CBC BMP.   If you have labs (blood work) drawn today and your tests are completely normal, you will receive your results only by: MyChart Message (if you have MyChart) OR A paper copy in the mail If you have any lab test that is abnormal or we need to change your treatment, we will call you to review the results.  Testing/Procedures:  [place] [facility] DEPT OF [place] [place] [address], [address] [place] [address] Dept: [phone] Loc: [phone]  Chase Jensen  02/21/2024  You are scheduled for a Cardiac Catheterization on Wednesday, May 21 with Dr. Antionette Kirks.  1. Please arrive at the [facility] (Main Entrance A) at [facility]: [address] [place], [place] [address] at 6:30 AM (This time is 2 hour(s) before your procedure to ensure your preparation).   Free valet parking service is available. You will check in at ADMITTING. The support person will be asked to wait in the waiting room.  It is OK to have someone drop you off and come back when you are ready to be discharged.    Special note: Every effort is made to have your procedure done on time. Please understand that emergencies sometimes delay scheduled procedures.  2. Diet: Do not eat solid foods after midnight.  The patient may have clear liquids until 5am upon the day of the procedure.  3. Labs: You will need to have blood drawn on  Feb 21, 2024.  4. Medication instructions in preparation for your procedure:   Contrast Allergy: No    On the morning of your procedure, take your Aspirin  81 mg and any morning medicines NOT listed  above.  You may use sips of water.  5. Plan to go home the same day, you will only stay overnight if medically necessary. 6. Bring a current list of your medications and current insurance cards. 7. You MUST have a responsible person to drive you home. 8. Someone MUST be with you the first 24 hours after you arrive home or your discharge will be delayed. 9. Please wear clothes that are easy to get on and off and wear slip-on shoes.  Thank you for allowing us  to care for you!   -- [facility]   Follow-Up: At [facility], you and your health needs are our priority.  As part of our continuing mission to provide you with exceptional heart care, our providers are all part of one team.  This team includes your primary Cardiologist (physician) and Advanced Practice Providers or APPs (Physician Assistants and Nurse Practitioners) who all work together to provide you with the care you need, when you need it.  Your next appointment:   2 week(s)  Provider:   [name], PA-C

## 2024-02-21 NOTE — H&P (View-Only) (Signed)
 Cardiology Office Note    Date:  02/21/2024   ID:  Chase Jensen, DOB 1965-11-28, MRN [id number]  PCP:  Nestor Banter, MD  Cardiologist:  Antionette Kirks, MD  Electrophysiologist:  None   Chief Complaint: Follow-up  History of Present Illness:   Chase Jensen is a 58 y.o. male with history of CAD status post balloon angioplasty without stent in 2006 in [place], [place]  s/p PCI/DES to the LAD in 12/2023, dilated ascending thoracic aorta, frequent PVCs, HTN, severe hyperlipidemia, and family history of premature CAD with his father having CABG at age 56 who presents for follow-up of CAD.   Prior cardiology care in [place], [place] , and [place], [place] .  He underwent balloon angioplasty without stent placement in 2006 in [place], [place] .  At that time, no stent was placed due to bifurcation location.  He is a non-smoker.  He was last seen in the office in 07/2023 noting his lower extremity nocturnal cramping that felt similar to what he experienced while on ezetimibe .  He also noted lower extremity swelling that was progressive throughout the day and improved early in the morning.  He was without symptoms of shortness of breath or orthopnea.  Echo in 08/2023 showed an EF of 60 to 65%, no regional wall motion abnormalities, normal LV diastolic function parameters, normal RV systolic function and ventricular cavity size, tricuspid aortic valve, and mild dilatation of the ascending aorta measuring 39 mm.   He was seen in the office on 12/03/2023 noting an episode of exertional chest discomfort several days prior that improved after belching and rest.  In this setting he underwent coronary CTA on 12/12/2023 that showed a calcium  score of 267, which was the 85th percentile.  There was severe (greater than 70%) proximal LAD stenosis with less than 25% proximal LCx stenosis.  ctFFR 0.65 in the proximal LAD.  In this setting he underwent LHC on 12/18/2023 that showed severe  two-vessel CAD involving the proximal/mid LAD and superior branch of the ramus artery as outlined below.  He underwent successful PCI/DES to the LAD.  It was recommended to treat the ramus branch medically given diameter of 2.5 mm and being diseased in 2 different areas with consideration for PCI for refractory angina despite pharmacotherapy.  He followed up in the office on 01/14/2024 continuing to note intermittent chest discomfort, particularly when laying on his left or right sides.  He also continues to note intermittent palpitations.  To escalate antianginal therapy he was started on Toprol -XL 12.5 mg.  Zio patch showed a predominant rhythm of sinus with an average rate of 66 bpm (range 46 to 120 bpm) with frequent PVCs with an overall burden of 6.4%.  He contacted our office continuing to have intermittent chest discomfort and was started on Imdur with subsequent MyChart message noting headache approximately 1 hour after taking this.  He comes in accompanied by his wife and continues to note exertional substernal chest discomfort with associated dyspnea.  Palpitations are improved.  No dizziness, presyncope, or syncope.  No falls or symptoms concerning for bleeding.  He has noted a headache since beginning Imdur that typically begins approximately 1 hour after taking the medication and persists throughout the day.     Labs independently reviewed: 12/2023 - Hgb 14.2, PLT 218, potassium 4.0, BUN 18, serum creatinine 0.98 11/2023 - TSH 6.377, albumin 4.0, AST/ALT normal, TC 153, TG 107, HDL 48, LDL 85  Past Medical History:  Diagnosis Date   Coronary artery disease 2006  balloon angioplasty without stent placement to one-vessel done at [facility] in [place]     Hyperlipemia    Hypertension    Hypothyroidism     Past Surgical History:  Procedure Laterality Date   CARDIAC SURGERY     CHOLECYSTECTOMY     COLONOSCOPY WITH PROPOFOL  N/A 07/29/2015   Procedure: COLONOSCOPY WITH  PROPOFOL ;  Surgeon: Luella Sager, MD;  Location: [facility] ENDOSCOPY;  Service: Endoscopy;  Laterality: N/A;   CORONARY ANGIOPLASTY     CORONARY STENT INTERVENTION N/A 12/18/2023   Procedure: CORONARY STENT INTERVENTION;  Surgeon: Wenona Hamilton, MD;  Location: [facility] INVASIVE CV LAB;  Service: Cardiovascular;  Laterality: N/A;  prox LAD   HERNIA REPAIR     LEFT HEART CATH AND CORONARY ANGIOGRAPHY N/A 12/18/2023   Procedure: LEFT HEART CATH AND CORONARY ANGIOGRAPHY;  Surgeon: Wenona Hamilton, MD;  Location: [facility] INVASIVE CV LAB;  Service: Cardiovascular;  Laterality: N/A;   NASAL SINUS SURGERY     PILONIDAL CYST DRAINAGE     TONSILLECTOMY      Current Medications: Current Meds  Medication Sig   Ascorbic Acid (VITAMIN C PO) Take by mouth daily.   aspirin  81 MG EC tablet Take 81 mg by mouth daily. Swallow whole.   atorvastatin  (LIPITOR) 80 MG tablet Take 1 tablet (80 mg total) by mouth daily.   clopidogrel  (PLAVIX ) 75 MG tablet Take 1 tablet (75 mg total) by mouth daily.   fluticasone (FLONASE) 50 MCG/ACT nasal spray Place into the nose as needed.   isosorbide mononitrate (IMDUR) 30 MG 24 hr tablet Take 0.5 tablets (15 mg total) by mouth daily.   levothyroxine  (SYNTHROID ) 150 MCG tablet Take 150 mcg by mouth daily before breakfast.   metoprolol  succinate (TOPROL  XL) 25 MG 24 hr tablet Take 0.5 tablets (12.5 mg total) by mouth at bedtime.   multivitamin-lutein  (OCUVITE-LUTEIN ) CAPS capsule Take 1 capsule by mouth daily.   nitroGLYCERIN  (NITROSTAT ) 0.4 MG SL tablet Place 1 tablet (0.4 mg total) under the tongue every 5 (five) minutes as needed for chest pain.   SUMAtriptan (IMITREX) 100 MG tablet Take 100 mg by mouth as needed.    Allergies:   Zetia  [ezetimibe ], Penicillins, and Propoxyphene   Social History   Socioeconomic History   Marital status: Married    Spouse name: Not on file   Number of children: Not on file   Years of education: Not on file   Highest education level: Not on  file  Occupational History   Not on file  Tobacco Use   Smoking status: Never   Smokeless tobacco: Never  Vaping Use   Vaping status: Never Used  Substance and Sexual Activity   Alcohol use: Not on file   Drug use: Not on file   Sexual activity: Not on file  Other Topics Concern   Not on file  Social History Narrative   Not on file   Social Drivers of Health   Financial Resource Strain: Low Risk  (12/13/2023)   Received from [facility]   Overall Financial Resource Strain (CARDIA)    Difficulty of Paying Living Expenses: Not hard at all  Food Insecurity: No Food Insecurity (12/13/2023)   Received from [facility]   Hunger Vital Sign    Worried About Running Out of Food in the Last Year: Never true    Ran Out of Food in the Last Year: Never true  Transportation Needs: No Transportation Needs (12/13/2023)   Received  from [facility] - Transportation    In the past 12 months, has lack of transportation kept you from medical appointments or from getting medications?: No    Lack of Transportation (Non-Medical): No  Physical Activity: Not on file  Stress: Not on file  Social Connections: Not on file     Family History:  The patient's family history is not on file.  ROS:   12-point review of systems is negative unless otherwise noted in the HPI.   EKGs/Labs/Other Studies Reviewed:    Studies reviewed were summarized above. The additional studies were reviewed today:  2D echo 08/12/2023: 1. Left ventricular ejection fraction, by estimation, is 60 to 65%. The  left ventricle has normal function. The left ventricle has no regional  wall motion abnormalities. Left ventricular diastolic parameters were  normal. The average left ventricular  global longitudinal strain is -16.0 %. The global longitudinal strain is  normal.   2. Right ventricular systolic function is normal. The right ventricular  size is normal.   3.  The mitral valve is normal in structure. No evidence of mitral valve  regurgitation.   4. The aortic valve is tricuspid. Aortic valve regurgitation is not  visualized.   5. Aortic dilatation noted. There is mild dilatation of the ascending  aorta, measuring 39 mm.   6. The inferior vena cava is normal in size with greater than 50%  respiratory variability, suggesting right atrial pressure of 3 mmHg.  __________   Coronary CTA 12/12/2023: FINDINGS: Aorta:  Normal size.  No calcifications.  No dissection.   Aortic Valve:  Trileaflet.  No calcifications.   Coronary Arteries:  Normal coronary origin.  Right dominance.   RCA is a dominant artery. There is calcified plaque in the distal RCA causing mild stenosis (25-49%).   Left main gives rise to LAD and LCX arteries. LM has no disease.   LAD has non calcified plaque proximally causing severe stenosis (>70%).   LCX is a non-dominant artery. There is calcified plaque in the proximal LCx causing minimal stenosis (<25%).   Other findings:   Normal pulmonary vein drainage into the left atrium.   Normal left atrial appendage without a thrombus.   Normal size of the pulmonary artery.   IMPRESSION: 1. Coronary calcium  score of 267. This was 85th percentile for age and sex matched control.   2. Normal coronary origin with right dominance.   3. Severe proximal LAD stenosis (>70%).   4. Minimal proximal LCx stenosis (<25%).   5. CAD-RADS 4 Severe stenosis. (70-99% or > 50% left main). Cardiac catheterization is recommended. Consider symptom-guided anti-ischemic pharmacotherapy as well as risk factor modification per guideline directed care.   6. Additional analysis with CT FFR will be submitted and reported separately.     CT FFR: 1. Left Main:  No significant stenosis.   2. LAD: significant stenosis in proximal LAD.  FFRct 0.65 3. LCX: No significant stenosis.  FFRct 0.80 4. RCA: No significant stenosis.  FFRct 0.90    IMPRESSION: 1. CT FFR analysis showed significant stenosis in the proximal LAD. FFRct 0.65.   2.  Cardiac catheterization recommended. __________   [facility] 12/18/2023:   Ramus-2 lesion is 95% stenosed.   Ramus-1 lesion is 80% stenosed.   Prox RCA lesion is 20% stenosed.   RPAV lesion is 30% stenosed.   Prox LAD to Mid LAD lesion is 95% stenosed.   Dist LAD lesion is 40% stenosed.   1st  Sept lesion is 90% stenosed.   A drug-eluting stent was successfully placed using a STENT SYS SYNERGY XD 3.5X20.   Post intervention, there is a 0% residual stenosis.   The left ventricular systolic function is normal.   LV end diastolic pressure is mildly elevated.   The left ventricular ejection fraction is 55-65% by visual estimate.   1.  Severe two-vessel coronary artery disease involving the proximal/mid LAD and the superior branch of the ramus artery. 2.  Normal LV systolic function and mildly elevated left ventricular end-diastolic pressure. 3.  Successful angioplasty and drug-eluting stent placement to the LAD using a 3.5 mm stent which was postdilated to 4.2 millimeter distally and 4.5 mm proximally   Recommendations: Dual antiplatelet therapy for at least 6 months. Aggressive treatment of risk factors. Will treat the ramus branch medically for now given diameter of 2.5 and being diseased in 2 different areas.  However, if the patient has residual angina, this can be treated with PCI. __________  Zio patch 01/2024: Patient had a min HR of 46 bpm, max HR of 120 bpm, and avg HR of 66 bpm. Predominant underlying rhythm was Sinus Rhythm. Isolated SVEs were rare (<1.0%), SVE Couplets were rare (<1.0%), and SVE Triplets were rare (<1.0%). Isolated VEs were frequent  (6.4%, 80371), VE Couplets were rare (<1.0%, 2899), and no VE Triplets were present. Ventricular Bigeminy and Trigeminy were present.    EKG:  EKG is ordered today.  The EKG ordered today demonstrates NSR, 60 bpm, occasional PVCs,  ventricular bigeminy, no acute ST-T changes  Recent Labs: 11/21/2023: ALT 30 12/17/2023: BUN 18; Creatinine, Ser 0.98; Hemoglobin 14.2; Platelets 218; Potassium 4.0; Sodium 139  Recent Lipid Panel    Component Value Date/Time   CHOL 153 11/21/2023 0820   TRIG 107 11/21/2023 0820   HDL 48 11/21/2023 0820   CHOLHDL 3.2 11/21/2023 0820   CHOLHDL 2.4 08/07/2019 0921   VLDL 17 08/07/2019 0921   LDLCALC 85 11/21/2023 0820    PHYSICAL EXAM:    VS:  BP (!) 142/62   Pulse (!) 58   Ht 6' (1.829 m)   Wt 205 lb 9.6 oz (93.3 kg)   SpO2 97%   BMI 27.88 kg/m   BMI: Body mass index is 27.88 kg/m.  Physical Exam Vitals reviewed.  Constitutional:      Appearance: He is well-developed.  HENT:     Head: Normocephalic and atraumatic.  Eyes:     General:        Right eye: No discharge.        Left eye: No discharge.  Cardiovascular:     Rate and Rhythm: Normal rate and regular rhythm. Occasional Extrasystoles are present.    Heart sounds: Normal heart sounds, S1 normal and S2 normal. Heart sounds not distant. No midsystolic click and no opening snap. No murmur heard.    No friction rub.  Pulmonary:     Effort: Pulmonary effort is normal. No respiratory distress.     Breath sounds: Normal breath sounds. No decreased breath sounds, wheezing, rhonchi or rales.  Chest:     Chest wall: No tenderness.  Musculoskeletal:     Cervical back: Normal range of motion.  Skin:    General: Skin is warm and dry.     Nails: There is no clubbing.  Neurological:     Mental Status: He is alert and oriented to person, place, and time.  Psychiatric:        Speech: Speech normal.  Behavior: Behavior normal.        Thought Content: Thought content normal.        Judgment: Judgment normal.     Wt Readings from Last 3 Encounters:  02/21/24 205 lb 9.6 oz (93.3 kg)  01/21/24 205 lb 1.6 oz (93 kg)  01/14/24 206 lb 9.6 oz (93.7 kg)     ASSESSMENT & PLAN:   CAD involving native coronary arteries  with stable angina: He continues to note exertional angina and dyspnea despite PCI to the LAD with residual sequential ramus intermedius stenoses as outlined above.  Despite escalation of antianginal therapy as tolerated, he has continued to have exertional symptoms.  Discontinue isosorbide mononitrate secondary to headache.  Bradycardia precludes escalation of beta-blocker for antianginal effect.  We did discuss further escalation of pharmacotherapy with addition of calcium  channel blocker versus ranolazine, however given continued exertional component of symptoms we have elected to proceed with cardiac cath for possible intervention along the ramus intermedius.  He will continue aggressive secondary prevention and current pharmacotherapy including aspirin  81 mg, clopidogrel  75 mg, atorvastatin  80 mg, Toprol -XL 12.5 mg, and as needed SL NTG.  Frequent PVCs: Zio patch in 01/2024 showed frequent PVCs with an overall burden of 6.4%.  Symptomatically improved.  PVCs noted on EKG today.  Cardiac cath as outlined above.  Continue Toprol -XL 12.5 mg daily.  Bradycardia precludes escalation of beta-blocker at this time.  Dilated ascending thoracic aorta: Mildly dilated by echo in 08/2023 measuring 39 mm.  He is scheduled for follow-up echo in 08/2024.  HTN: Blood pressure is mildly elevated in the office today.  Continue Toprol -XL as outlined above.  HLD with statin intolerance: LDL 85 in 11/2023.  Tolerating atorvastatin  80 mg.  Anticipate fasting lipid panel at next visit; if LDL remains above goal we will need to discuss PCSK9 inhibitor for further risk stratification.  This was not discussed in detail today given need to discuss further ischemic testing as outlined above.   Informed Consent   Shared Decision Making/Informed Consent{  The risks [stroke (1 in 1000), death (1 in 1000), kidney failure [usually temporary] (1 in 500), bleeding (1 in 200), allergic reaction [possibly serious] (1 in 200)], benefits  (diagnostic support and management of coronary artery disease) and alternatives of a cardiac catheterization were discussed in detail with Mr. Traficante and he is willing to proceed.        Disposition: F/u with Dr. Alvenia Aus or an APP in 2-3 weeks.   Medication Adjustments/Labs and Tests Ordered: Current medicines are reviewed at length with the patient today.  Concerns regarding medicines are outlined above. Medication changes, Labs and Tests ordered today are summarized above and listed in the Patient Instructions accessible in Encounters.   Signed, [name], PA-C 02/21/2024 10:05 AM     [facility] [place] [address] [place], [place] [address] [phone]

## 2024-02-21 NOTE — Progress Notes (Signed)
 Cardiology Office Note    Date:  02/21/2024   ID:  Chase Jensen, DOB 1965-11-28, MRN [id number]  PCP:  Nestor Banter, MD  Cardiologist:  Antionette Kirks, MD  Electrophysiologist:  None   Chief Complaint: Follow-up  History of Present Illness:   Chase Jensen is a 58 y.o. male with history of CAD status post balloon angioplasty without stent in 2006 in [place], [place]  s/p PCI/DES to the LAD in 12/2023, dilated ascending thoracic aorta, frequent PVCs, HTN, severe hyperlipidemia, and family history of premature CAD with his father having CABG at age 56 who presents for follow-up of CAD.   Prior cardiology care in [place], [place] , and [place], [place] .  He underwent balloon angioplasty without stent placement in 2006 in [place], [place] .  At that time, no stent was placed due to bifurcation location.  He is a non-smoker.  He was last seen in the office in 07/2023 noting his lower extremity nocturnal cramping that felt similar to what he experienced while on ezetimibe .  He also noted lower extremity swelling that was progressive throughout the day and improved early in the morning.  He was without symptoms of shortness of breath or orthopnea.  Echo in 08/2023 showed an EF of 60 to 65%, no regional wall motion abnormalities, normal LV diastolic function parameters, normal RV systolic function and ventricular cavity size, tricuspid aortic valve, and mild dilatation of the ascending aorta measuring 39 mm.   He was seen in the office on 12/03/2023 noting an episode of exertional chest discomfort several days prior that improved after belching and rest.  In this setting he underwent coronary CTA on 12/12/2023 that showed a calcium  score of 267, which was the 85th percentile.  There was severe (greater than 70%) proximal LAD stenosis with less than 25% proximal LCx stenosis.  ctFFR 0.65 in the proximal LAD.  In this setting he underwent LHC on 12/18/2023 that showed severe  two-vessel CAD involving the proximal/mid LAD and superior branch of the ramus artery as outlined below.  He underwent successful PCI/DES to the LAD.  It was recommended to treat the ramus branch medically given diameter of 2.5 mm and being diseased in 2 different areas with consideration for PCI for refractory angina despite pharmacotherapy.  He followed up in the office on 01/14/2024 continuing to note intermittent chest discomfort, particularly when laying on his left or right sides.  He also continues to note intermittent palpitations.  To escalate antianginal therapy he was started on Toprol -XL 12.5 mg.  Zio patch showed a predominant rhythm of sinus with an average rate of 66 bpm (range 46 to 120 bpm) with frequent PVCs with an overall burden of 6.4%.  He contacted our office continuing to have intermittent chest discomfort and was started on Imdur with subsequent MyChart message noting headache approximately 1 hour after taking this.  He comes in accompanied by his wife and continues to note exertional substernal chest discomfort with associated dyspnea.  Palpitations are improved.  No dizziness, presyncope, or syncope.  No falls or symptoms concerning for bleeding.  He has noted a headache since beginning Imdur that typically begins approximately 1 hour after taking the medication and persists throughout the day.     Labs independently reviewed: 12/2023 - Hgb 14.2, PLT 218, potassium 4.0, BUN 18, serum creatinine 0.98 11/2023 - TSH 6.377, albumin 4.0, AST/ALT normal, TC 153, TG 107, HDL 48, LDL 85  Past Medical History:  Diagnosis Date   Coronary artery disease 2006  balloon angioplasty without stent placement to one-vessel done at [facility] in [place]     Hyperlipemia    Hypertension    Hypothyroidism     Past Surgical History:  Procedure Laterality Date   CARDIAC SURGERY     CHOLECYSTECTOMY     COLONOSCOPY WITH PROPOFOL  N/A 07/29/2015   Procedure: COLONOSCOPY WITH  PROPOFOL ;  Surgeon: Luella Sager, MD;  Location: [facility] ENDOSCOPY;  Service: Endoscopy;  Laterality: N/A;   CORONARY ANGIOPLASTY     CORONARY STENT INTERVENTION N/A 12/18/2023   Procedure: CORONARY STENT INTERVENTION;  Surgeon: Wenona Hamilton, MD;  Location: [facility] INVASIVE CV LAB;  Service: Cardiovascular;  Laterality: N/A;  prox LAD   HERNIA REPAIR     LEFT HEART CATH AND CORONARY ANGIOGRAPHY N/A 12/18/2023   Procedure: LEFT HEART CATH AND CORONARY ANGIOGRAPHY;  Surgeon: Wenona Hamilton, MD;  Location: [facility] INVASIVE CV LAB;  Service: Cardiovascular;  Laterality: N/A;   NASAL SINUS SURGERY     PILONIDAL CYST DRAINAGE     TONSILLECTOMY      Current Medications: Current Meds  Medication Sig   Ascorbic Acid (VITAMIN C PO) Take by mouth daily.   aspirin  81 MG EC tablet Take 81 mg by mouth daily. Swallow whole.   atorvastatin  (LIPITOR) 80 MG tablet Take 1 tablet (80 mg total) by mouth daily.   clopidogrel  (PLAVIX ) 75 MG tablet Take 1 tablet (75 mg total) by mouth daily.   fluticasone (FLONASE) 50 MCG/ACT nasal spray Place into the nose as needed.   isosorbide mononitrate (IMDUR) 30 MG 24 hr tablet Take 0.5 tablets (15 mg total) by mouth daily.   levothyroxine  (SYNTHROID ) 150 MCG tablet Take 150 mcg by mouth daily before breakfast.   metoprolol  succinate (TOPROL  XL) 25 MG 24 hr tablet Take 0.5 tablets (12.5 mg total) by mouth at bedtime.   multivitamin-lutein  (OCUVITE-LUTEIN ) CAPS capsule Take 1 capsule by mouth daily.   nitroGLYCERIN  (NITROSTAT ) 0.4 MG SL tablet Place 1 tablet (0.4 mg total) under the tongue every 5 (five) minutes as needed for chest pain.   SUMAtriptan (IMITREX) 100 MG tablet Take 100 mg by mouth as needed.    Allergies:   Zetia  [ezetimibe ], Penicillins, and Propoxyphene   Social History   Socioeconomic History   Marital status: Married    Spouse name: Not on file   Number of children: Not on file   Years of education: Not on file   Highest education level: Not on  file  Occupational History   Not on file  Tobacco Use   Smoking status: Never   Smokeless tobacco: Never  Vaping Use   Vaping status: Never Used  Substance and Sexual Activity   Alcohol use: Not on file   Drug use: Not on file   Sexual activity: Not on file  Other Topics Concern   Not on file  Social History Narrative   Not on file   Social Drivers of Health   Financial Resource Strain: Low Risk  (12/13/2023)   Received from [facility]   Overall Financial Resource Strain (CARDIA)    Difficulty of Paying Living Expenses: Not hard at all  Food Insecurity: No Food Insecurity (12/13/2023)   Received from [facility]   Hunger Vital Sign    Worried About Running Out of Food in the Last Year: Never true    Ran Out of Food in the Last Year: Never true  Transportation Needs: No Transportation Needs (12/13/2023)   Received  from [facility] - Transportation    In the past 12 months, has lack of transportation kept you from medical appointments or from getting medications?: No    Lack of Transportation (Non-Medical): No  Physical Activity: Not on file  Stress: Not on file  Social Connections: Not on file     Family History:  The patient's family history is not on file.  ROS:   12-point review of systems is negative unless otherwise noted in the HPI.   EKGs/Labs/Other Studies Reviewed:    Studies reviewed were summarized above. The additional studies were reviewed today:  2D echo 08/12/2023: 1. Left ventricular ejection fraction, by estimation, is 60 to 65%. The  left ventricle has normal function. The left ventricle has no regional  wall motion abnormalities. Left ventricular diastolic parameters were  normal. The average left ventricular  global longitudinal strain is -16.0 %. The global longitudinal strain is  normal.   2. Right ventricular systolic function is normal. The right ventricular  size is normal.   3.  The mitral valve is normal in structure. No evidence of mitral valve  regurgitation.   4. The aortic valve is tricuspid. Aortic valve regurgitation is not  visualized.   5. Aortic dilatation noted. There is mild dilatation of the ascending  aorta, measuring 39 mm.   6. The inferior vena cava is normal in size with greater than 50%  respiratory variability, suggesting right atrial pressure of 3 mmHg.  __________   Coronary CTA 12/12/2023: FINDINGS: Aorta:  Normal size.  No calcifications.  No dissection.   Aortic Valve:  Trileaflet.  No calcifications.   Coronary Arteries:  Normal coronary origin.  Right dominance.   RCA is a dominant artery. There is calcified plaque in the distal RCA causing mild stenosis (25-49%).   Left main gives rise to LAD and LCX arteries. LM has no disease.   LAD has non calcified plaque proximally causing severe stenosis (>70%).   LCX is a non-dominant artery. There is calcified plaque in the proximal LCx causing minimal stenosis (<25%).   Other findings:   Normal pulmonary vein drainage into the left atrium.   Normal left atrial appendage without a thrombus.   Normal size of the pulmonary artery.   IMPRESSION: 1. Coronary calcium  score of 267. This was 85th percentile for age and sex matched control.   2. Normal coronary origin with right dominance.   3. Severe proximal LAD stenosis (>70%).   4. Minimal proximal LCx stenosis (<25%).   5. CAD-RADS 4 Severe stenosis. (70-99% or > 50% left main). Cardiac catheterization is recommended. Consider symptom-guided anti-ischemic pharmacotherapy as well as risk factor modification per guideline directed care.   6. Additional analysis with CT FFR will be submitted and reported separately.     CT FFR: 1. Left Main:  No significant stenosis.   2. LAD: significant stenosis in proximal LAD.  FFRct 0.65 3. LCX: No significant stenosis.  FFRct 0.80 4. RCA: No significant stenosis.  FFRct 0.90    IMPRESSION: 1. CT FFR analysis showed significant stenosis in the proximal LAD. FFRct 0.65.   2.  Cardiac catheterization recommended. __________   [facility] 12/18/2023:   Ramus-2 lesion is 95% stenosed.   Ramus-1 lesion is 80% stenosed.   Prox RCA lesion is 20% stenosed.   RPAV lesion is 30% stenosed.   Prox LAD to Mid LAD lesion is 95% stenosed.   Dist LAD lesion is 40% stenosed.   1st  Sept lesion is 90% stenosed.   A drug-eluting stent was successfully placed using a STENT SYS SYNERGY XD 3.5X20.   Post intervention, there is a 0% residual stenosis.   The left ventricular systolic function is normal.   LV end diastolic pressure is mildly elevated.   The left ventricular ejection fraction is 55-65% by visual estimate.   1.  Severe two-vessel coronary artery disease involving the proximal/mid LAD and the superior branch of the ramus artery. 2.  Normal LV systolic function and mildly elevated left ventricular end-diastolic pressure. 3.  Successful angioplasty and drug-eluting stent placement to the LAD using a 3.5 mm stent which was postdilated to 4.2 millimeter distally and 4.5 mm proximally   Recommendations: Dual antiplatelet therapy for at least 6 months. Aggressive treatment of risk factors. Will treat the ramus branch medically for now given diameter of 2.5 and being diseased in 2 different areas.  However, if the patient has residual angina, this can be treated with PCI. __________  Zio patch 01/2024: Patient had a min HR of 46 bpm, max HR of 120 bpm, and avg HR of 66 bpm. Predominant underlying rhythm was Sinus Rhythm. Isolated SVEs were rare (<1.0%), SVE Couplets were rare (<1.0%), and SVE Triplets were rare (<1.0%). Isolated VEs were frequent  (6.4%, 80371), VE Couplets were rare (<1.0%, 2899), and no VE Triplets were present. Ventricular Bigeminy and Trigeminy were present.    EKG:  EKG is ordered today.  The EKG ordered today demonstrates NSR, 60 bpm, occasional PVCs,  ventricular bigeminy, no acute ST-T changes  Recent Labs: 11/21/2023: ALT 30 12/17/2023: BUN 18; Creatinine, Ser 0.98; Hemoglobin 14.2; Platelets 218; Potassium 4.0; Sodium 139  Recent Lipid Panel    Component Value Date/Time   CHOL 153 11/21/2023 0820   TRIG 107 11/21/2023 0820   HDL 48 11/21/2023 0820   CHOLHDL 3.2 11/21/2023 0820   CHOLHDL 2.4 08/07/2019 0921   VLDL 17 08/07/2019 0921   LDLCALC 85 11/21/2023 0820    PHYSICAL EXAM:    VS:  BP (!) 142/62   Pulse (!) 58   Ht 6' (1.829 m)   Wt 205 lb 9.6 oz (93.3 kg)   SpO2 97%   BMI 27.88 kg/m   BMI: Body mass index is 27.88 kg/m.  Physical Exam Vitals reviewed.  Constitutional:      Appearance: He is well-developed.  HENT:     Head: Normocephalic and atraumatic.  Eyes:     General:        Right eye: No discharge.        Left eye: No discharge.  Cardiovascular:     Rate and Rhythm: Normal rate and regular rhythm. Occasional Extrasystoles are present.    Heart sounds: Normal heart sounds, S1 normal and S2 normal. Heart sounds not distant. No midsystolic click and no opening snap. No murmur heard.    No friction rub.  Pulmonary:     Effort: Pulmonary effort is normal. No respiratory distress.     Breath sounds: Normal breath sounds. No decreased breath sounds, wheezing, rhonchi or rales.  Chest:     Chest wall: No tenderness.  Musculoskeletal:     Cervical back: Normal range of motion.  Skin:    General: Skin is warm and dry.     Nails: There is no clubbing.  Neurological:     Mental Status: He is alert and oriented to person, place, and time.  Psychiatric:        Speech: Speech normal.  Behavior: Behavior normal.        Thought Content: Thought content normal.        Judgment: Judgment normal.     Wt Readings from Last 3 Encounters:  02/21/24 205 lb 9.6 oz (93.3 kg)  01/21/24 205 lb 1.6 oz (93 kg)  01/14/24 206 lb 9.6 oz (93.7 kg)     ASSESSMENT & PLAN:   CAD involving native coronary arteries  with stable angina: He continues to note exertional angina and dyspnea despite PCI to the LAD with residual sequential ramus intermedius stenoses as outlined above.  Despite escalation of antianginal therapy as tolerated, he has continued to have exertional symptoms.  Discontinue isosorbide mononitrate secondary to headache.  Bradycardia precludes escalation of beta-blocker for antianginal effect.  We did discuss further escalation of pharmacotherapy with addition of calcium  channel blocker versus ranolazine, however given continued exertional component of symptoms we have elected to proceed with cardiac cath for possible intervention along the ramus intermedius.  He will continue aggressive secondary prevention and current pharmacotherapy including aspirin  81 mg, clopidogrel  75 mg, atorvastatin  80 mg, Toprol -XL 12.5 mg, and as needed SL NTG.  Frequent PVCs: Zio patch in 01/2024 showed frequent PVCs with an overall burden of 6.4%.  Symptomatically improved.  PVCs noted on EKG today.  Cardiac cath as outlined above.  Continue Toprol -XL 12.5 mg daily.  Bradycardia precludes escalation of beta-blocker at this time.  Dilated ascending thoracic aorta: Mildly dilated by echo in 08/2023 measuring 39 mm.  He is scheduled for follow-up echo in 08/2024.  HTN: Blood pressure is mildly elevated in the office today.  Continue Toprol -XL as outlined above.  HLD with statin intolerance: LDL 85 in 11/2023.  Tolerating atorvastatin  80 mg.  Anticipate fasting lipid panel at next visit; if LDL remains above goal we will need to discuss PCSK9 inhibitor for further risk stratification.  This was not discussed in detail today given need to discuss further ischemic testing as outlined above.   Informed Consent   Shared Decision Making/Informed Consent{  The risks [stroke (1 in 1000), death (1 in 1000), kidney failure [usually temporary] (1 in 500), bleeding (1 in 200), allergic reaction [possibly serious] (1 in 200)], benefits  (diagnostic support and management of coronary artery disease) and alternatives of a cardiac catheterization were discussed in detail with Mr. Traficante and he is willing to proceed.        Disposition: F/u with Dr. Alvenia Aus or an APP in 2-3 weeks.   Medication Adjustments/Labs and Tests Ordered: Current medicines are reviewed at length with the patient today.  Concerns regarding medicines are outlined above. Medication changes, Labs and Tests ordered today are summarized above and listed in the Patient Instructions accessible in Encounters.   Signed, [name], PA-C 02/21/2024 10:05 AM     [facility] [place] [address] [place], [place] [address] [phone]

## 2024-02-22 LAB — BASIC METABOLIC PANEL WITH GFR
BUN/Creatinine Ratio: 19 (ref 9–20)
BUN: 20 mg/dL (ref 6–24)
CO2: 21 mmol/L (ref 20–29)
Calcium: 9.8 mg/dL (ref 8.7–10.2)
Chloride: 105 mmol/L (ref 96–106)
Creatinine, Ser: 1.05 mg/dL (ref 0.76–1.27)
Glucose: 94 mg/dL (ref 70–99)
Potassium: 4.8 mmol/L (ref 3.5–5.2)
Sodium: 143 mmol/L (ref 134–144)
eGFR: 82 mL/min/{1.73_m2} (ref >59)

## 2024-02-22 LAB — CBC
Hematocrit: 45.6 % (ref 37.5–51.0)
Hemoglobin: 14.9 g/dL (ref 13.0–17.7)
MCH: 30.1 pg (ref 26.6–33.0)
MCHC: 32.7 g/dL (ref 31.5–35.7)
MCV: 92 fL (ref 79–97)
Platelets: 203 10*3/uL (ref 150–450)
RBC: 4.95 x10E6/uL (ref 4.14–5.80)
RDW: 12.3 % (ref 11.6–15.4)
WBC: 6.6 10*3/uL (ref 3.4–10.8)

## 2024-02-24 ENCOUNTER — Ambulatory Visit: Payer: Self-pay | Admitting: Physician Assistant

## 2024-02-24 DIAGNOSIS — I493 Ventricular premature depolarization: Secondary | ICD-10-CM

## 2024-02-24 DIAGNOSIS — R002 Palpitations: Secondary | ICD-10-CM

## 2024-02-25 ENCOUNTER — Telehealth: Payer: Self-pay | Admitting: *Deleted

## 2024-02-25 NOTE — Telephone Encounter (Signed)
 Cardiac Catheterization scheduled at [facility] for: Wednesday Feb 26, 2024  8:30 AM Arrival time [facility] Main Entrance A at: 6:30 AM  Nothing to eat after midnight prior to procedure, clear liquids until 5 AM day of procedure.  Medication instructions: -Usual morning medications can be taken with sips of water including aspirin  81 mg and Plavix  75 mg.  Plan to go home the same day, you will only stay overnight if medically necessary.  You must have responsible adult to drive you home.  Someone must be with you the first 24 hours after you arrive home.  Reviewed procedure instructions with patient.

## 2024-02-26 ENCOUNTER — Encounter (HOSPITAL_COMMUNITY): Payer: Self-pay | Admitting: Cardiovascular Disease

## 2024-02-26 ENCOUNTER — Other Ambulatory Visit: Payer: Self-pay

## 2024-02-26 ENCOUNTER — Encounter (HOSPITAL_COMMUNITY)
Admission: RE | Disposition: A | Discharge status: Home / Self Care | Payer: Self-pay | Source: Home / Self Care | Attending: Cardiovascular Disease

## 2024-02-26 ENCOUNTER — Ambulatory Visit (HOSPITAL_COMMUNITY)
Admission: RE | Admit: 2024-02-26 | Discharge: 2024-02-26 | Disposition: A | Discharge status: Home / Self Care | Payer: Self-pay | Attending: Cardiovascular Disease | Admitting: Cardiovascular Disease

## 2024-02-26 DIAGNOSIS — Z7982 Long term (current) use of aspirin: Secondary | ICD-10-CM | POA: Insufficient documentation

## 2024-02-26 DIAGNOSIS — Z79899 Other long term (current) drug therapy: Secondary | ICD-10-CM | POA: Insufficient documentation

## 2024-02-26 DIAGNOSIS — I2510 Atherosclerotic heart disease of native coronary artery without angina pectoris: Secondary | ICD-10-CM

## 2024-02-26 DIAGNOSIS — Z7902 Long term (current) use of antithrombotics/antiplatelets: Secondary | ICD-10-CM | POA: Insufficient documentation

## 2024-02-26 DIAGNOSIS — I25119 Atherosclerotic heart disease of native coronary artery with unspecified angina pectoris: Secondary | ICD-10-CM | POA: Insufficient documentation

## 2024-02-26 DIAGNOSIS — E785 Hyperlipidemia, unspecified: Secondary | ICD-10-CM | POA: Insufficient documentation

## 2024-02-26 DIAGNOSIS — I493 Ventricular premature depolarization: Secondary | ICD-10-CM | POA: Insufficient documentation

## 2024-02-26 DIAGNOSIS — I10 Essential (primary) hypertension: Secondary | ICD-10-CM | POA: Insufficient documentation

## 2024-02-26 DIAGNOSIS — Z955 Presence of coronary angioplasty implant and graft: Secondary | ICD-10-CM | POA: Insufficient documentation

## 2024-02-26 DIAGNOSIS — R079 Chest pain, unspecified: Secondary | ICD-10-CM | POA: Insufficient documentation

## 2024-02-26 HISTORY — PX: CORONARY STENT INTERVENTION: CATH118234

## 2024-02-26 HISTORY — PX: LEFT HEART CATH AND CORONARY ANGIOGRAPHY: CATH118249

## 2024-02-26 LAB — POCT ACTIVATED CLOTTING TIME: Activated Clotting Time: 302 s

## 2024-02-26 SURGERY — LEFT HEART CATH AND CORONARY ANGIOGRAPHY
Anesthesia: LOCAL

## 2024-02-26 MED ORDER — SODIUM CHLORIDE 0.9% FLUSH: 3.0000 mL | Freq: Two times a day (BID) | INTRAVENOUS | Status: DC

## 2024-02-26 MED ORDER — CLOPIDOGREL BISULFATE 75 MG PO TABS: 75.0000 mg | ORAL_TABLET | Freq: Every day | ORAL | Status: DC

## 2024-02-26 MED ORDER — FLUTICASONE PROPIONATE 50 MCG/ACT NA SUSP
1.0000 | Freq: Every day | NASAL | Status: DC
  Filled 2024-02-26: qty 16

## 2024-02-26 MED ORDER — NITROGLYCERIN 1 MG/10 ML FOR IR/CATH LAB
INTRA_ARTERIAL | Status: AC
  Filled 2024-02-26: qty 10

## 2024-02-26 MED ORDER — MIDAZOLAM HCL 2 MG/2ML IJ SOLN
INTRAMUSCULAR | Status: DC | PRN
  Administered 2024-02-26: 1 mg via INTRAVENOUS

## 2024-02-26 MED ORDER — NITROGLYCERIN 1 MG/10 ML FOR IR/CATH LAB
INTRA_ARTERIAL | Status: DC | PRN
  Administered 2024-02-26: 200 ug via INTRACORONARY

## 2024-02-26 MED ORDER — FENTANYL CITRATE (PF) 100 MCG/2ML IJ SOLN
INTRAMUSCULAR | Status: AC
  Filled 2024-02-26: qty 2

## 2024-02-26 MED ORDER — IOHEXOL 350 MG/ML SOLN
INTRAVENOUS | Status: DC | PRN
  Administered 2024-02-26: 125 mL via INTRA_ARTERIAL

## 2024-02-26 MED ORDER — ASPIRIN 81 MG PO CHEW: 81.0000 mg | CHEWABLE_TABLET | ORAL | Status: DC

## 2024-02-26 MED ORDER — CLOPIDOGREL BISULFATE 300 MG PO TABS
ORAL_TABLET | ORAL | Status: AC
  Filled 2024-02-26: qty 1

## 2024-02-26 MED ORDER — SODIUM CHLORIDE 0.9 % IV SOLN: INTRAVENOUS | Status: AC

## 2024-02-26 MED ORDER — HEPARIN SODIUM (PORCINE) 1000 UNIT/ML IJ SOLN
INTRAMUSCULAR | Status: AC
  Filled 2024-02-26: qty 10

## 2024-02-26 MED ORDER — SODIUM CHLORIDE 0.9 % WEIGHT BASED INFUSION
1.0000 mL/kg/h | INTRAVENOUS | Status: DC
Start: 2024-02-26 — End: 2024-02-26

## 2024-02-26 MED ORDER — NITROGLYCERIN 0.4 MG SL SUBL: 0.4000 mg | SUBLINGUAL_TABLET | SUBLINGUAL | Status: DC | PRN

## 2024-02-26 MED ORDER — CLOPIDOGREL BISULFATE 300 MG PO TABS
ORAL_TABLET | ORAL | Status: DC | PRN
  Administered 2024-02-26: 300 mg via ORAL

## 2024-02-26 MED ORDER — METOPROLOL SUCCINATE ER 25 MG PO TB24: 12.5000 mg | ORAL_TABLET | Freq: Every day | ORAL | Status: DC

## 2024-02-26 MED ORDER — HEPARIN SODIUM (PORCINE) 1000 UNIT/ML IJ SOLN
INTRAMUSCULAR | Status: DC | PRN
  Administered 2024-02-26: 6000 [IU] via INTRAVENOUS

## 2024-02-26 MED ORDER — SODIUM CHLORIDE 0.9 % IV SOLN: 250.0000 mL | INTRAVENOUS | Status: DC | PRN

## 2024-02-26 MED ORDER — FENTANYL CITRATE (PF) 100 MCG/2ML IJ SOLN
INTRAMUSCULAR | Status: DC | PRN
  Administered 2024-02-26: 50 ug via INTRAVENOUS

## 2024-02-26 MED ORDER — LIDOCAINE HCL (PF) 1 % IJ SOLN
INTRAMUSCULAR | Status: DC | PRN
  Administered 2024-02-26: 2 mL

## 2024-02-26 MED ORDER — LIDOCAINE HCL (PF) 1 % IJ SOLN
INTRAMUSCULAR | Status: AC
  Filled 2024-02-26: qty 30

## 2024-02-26 MED ORDER — MIDAZOLAM HCL 2 MG/2ML IJ SOLN
INTRAMUSCULAR | Status: AC
  Filled 2024-02-26: qty 2

## 2024-02-26 MED ORDER — SODIUM CHLORIDE 0.9 % IV SOLN
INTRAVENOUS | Status: DC | PRN
  Administered 2024-02-26: 10 mL/h via INTRAVENOUS

## 2024-02-26 MED ORDER — ONDANSETRON HCL 4 MG/2ML IJ SOLN: 4.0000 mg | Freq: Four times a day (QID) | INTRAMUSCULAR | Status: DC | PRN

## 2024-02-26 MED ORDER — SUMATRIPTAN SUCCINATE 100 MG PO TABS
100.0000 mg | ORAL_TABLET | ORAL | Status: DC | PRN
  Filled 2024-02-26: qty 1

## 2024-02-26 MED ORDER — ATORVASTATIN CALCIUM 80 MG PO TABS
80.0000 mg | ORAL_TABLET | Freq: Every day | ORAL | Status: DC
  Filled 2024-02-26: qty 1

## 2024-02-26 MED ORDER — LEVOTHYROXINE SODIUM 150 MCG PO TABS
150.0000 ug | ORAL_TABLET | Freq: Every day | ORAL | Status: DC
  Filled 2024-02-26: qty 1

## 2024-02-26 MED ORDER — SODIUM CHLORIDE 0.9 % WEIGHT BASED INFUSION: 3.0000 mL/kg/h | INTRAVENOUS | Status: DC

## 2024-02-26 MED ORDER — VERAPAMIL HCL 2.5 MG/ML IV SOLN
INTRAVENOUS | Status: AC
  Filled 2024-02-26: qty 2

## 2024-02-26 MED ORDER — ACETAMINOPHEN 325 MG PO TABS: 650.0000 mg | ORAL_TABLET | ORAL | Status: DC | PRN

## 2024-02-26 MED ORDER — VERAPAMIL HCL 2.5 MG/ML IV SOLN
INTRAVENOUS | Status: DC | PRN
  Administered 2024-02-26: 10 mL via INTRA_ARTERIAL

## 2024-02-26 MED ORDER — CLOPIDOGREL BISULFATE 75 MG PO TABS: 75.0000 mg | ORAL_TABLET | ORAL | Status: DC

## 2024-02-26 MED ORDER — HEPARIN (PORCINE) IN NACL 1000-0.9 UT/500ML-% IV SOLN
INTRAVENOUS | Status: DC | PRN
  Administered 2024-02-26: 1000 mL via SURGICAL_CAVITY

## 2024-02-26 MED ORDER — ASPIRIN 81 MG PO TBEC: 81.0000 mg | DELAYED_RELEASE_TABLET | Freq: Every day | ORAL | Status: DC

## 2024-02-26 MED ORDER — SODIUM CHLORIDE 0.9% FLUSH: 3.0000 mL | INTRAVENOUS | Status: DC | PRN

## 2024-02-26 SURGICAL SUPPLY — 17 items
BALLOON EMERGE MR 2.0X12 (BALLOONS) IMPLANT
BALLOON ~~LOC~~ EMERGE MR 2.75X20 (BALLOONS) IMPLANT
BALLOON ~~LOC~~ EMERGE MR 3.0X20 (BALLOONS) IMPLANT
CATH INFINITI AMBI 5FR JK (CATHETERS) IMPLANT
CATH INFINITI JR4 5F (CATHETERS) IMPLANT
CATH LAUNCHER 6FR EBU 3.75 (CATHETERS) IMPLANT
DEVICE RAD COMP TR BAND LRG (VASCULAR PRODUCTS) IMPLANT
GLIDESHEATH SLEND SS 6F .021 (SHEATH) IMPLANT
GUIDEWIRE INQWIRE 1.5J.035X260 (WIRE) IMPLANT
KIT ENCORE 26 ADVANTAGE (KITS) IMPLANT
KIT SYRINGE INJ CVI SPIKEX1 (MISCELLANEOUS) IMPLANT
PACK CARDIAC CATHETERIZATION (CUSTOM PROCEDURE TRAY) ×1 IMPLANT
SET ATX-X65L (MISCELLANEOUS) IMPLANT
STENT SYNERGY XD 2.25X28 (Permanent Stent) IMPLANT
STENT SYNERGY XD 2.50X20 (Permanent Stent) IMPLANT
TUBING CIL FLEX 10 FLL-RA (TUBING) IMPLANT
WIRE RUNTHROUGH IZANAI 014 180 (WIRE) IMPLANT

## 2024-02-26 NOTE — Discharge Summary (Signed)
 Discharge Summary for Same Day PCI   Patient ID: Chase Jensen MRN: [id number]; DOB: [birth year]  Admit date: 02/26/2024 Discharge date: 02/26/2024  Primary Care Provider: Nestor Banter, MD  Primary Cardiologist: Chase Kirks, MD  Primary Electrophysiologist:  None   Discharge Diagnoses    Active Problems:   Chest pain  Diagnostic Studies/Procedures    Cardiac Catheterization 02/26/2024:    Dist LAD lesion is 40% stenosed.   Prox RCA lesion is 20% stenosed.   1st Sept lesion is 90% stenosed.   RPAV lesion is 30% stenosed.   RPDA lesion is 30% stenosed.   Ramus-2 lesion is 99% stenosed.   Ramus-1 lesion is 95% stenosed.   Ramus-3 lesion is 60% stenosed.   Non-stenotic Prox LAD to Mid LAD lesion was previously treated.   A drug-eluting stent was successfully placed using a STENT SYNERGY XD 2.25X28.   A drug-eluting stent was successfully placed using a STENT SYNERGY XD 2.50X20.   Post intervention, there is a 0% residual stenosis.   Post intervention, there is a 0% residual stenosis.   The left ventricular systolic function is normal.   LV end diastolic pressure is mildly elevated.   The left ventricular ejection fraction is 55-65% by visual estimate.   In the absence of any other complications or medical issues, we expect the patient to be ready for discharge from an interventional cardiology perspective on 02/26/2024.   Recommend dual antiplatelet therapy with Aspirin  81mg  daily and Clopidogrel  75mg  daily.   1.  Widely patent LAD stent with no significant restenosis.  Worsening disease in the superior branch of the ramus artery. 2.  Normal LV systolic function and mildly elevated left ventricular end-diastolic pressure. 3.  Successful angioplasty and 2 overlapped drug-eluting stent placement to the superior branch of the ramus artery.   Recommendations: Lifelong dual antiplatelet therapy if tolerated. The patient has aggressive disease.  Recommend a target LDL of  less than 55 with a low threshold for using a PCSK9 inhibitor. Continue management of PVCs. _____________   History of Present Illness     Chase Jensen is a 58 y.o. male with past medical history of CAD status post balloon angioplasty without stent '06, PCI/DES to LAD '12/2023, dilated ascending thoracic aorta, PVCs, hypertension, hyperlipidemia who was recently seen in the office on 5/16 with Chase Jensen.  Prior cardiology care in [place], [place] , and [place], [place] .  He underwent balloon angioplasty without stent placement in 2006 in [place], [place] .  At that time, no stent was placed due to bifurcation location.  He was seen in the office on 07/2023 noting his lower extremity nocturnal cramping that felt similar to what he experienced while on ezetimibe .  He also noted lower extremity swelling that was progressive throughout the day and improved early in the morning.  He was without symptoms of shortness of breath or orthopnea.  Echo in 08/2023 showed an EF of 60 to 65%, no regional wall motion abnormalities, normal LV diastolic function parameters, normal RV systolic function and ventricular cavity size, tricuspid aortic valve, and mild dilatation of the ascending aorta measuring 39 mm.   Seen in the office on 12/03/2023 noting an episode of exertional chest discomfort several days prior that improved after belching and rest.  In this setting he underwent coronary CTA on 12/12/2023 that showed a calcium  score of 267, which was the 85th percentile.  There was severe (greater than 70%) proximal LAD stenosis with less than 25% proximal LCx stenosis.  ctFFR 0.65 in the proximal LAD.  In this setting he underwent LHC on 12/18/2023 that showed severe two-vessel CAD involving the proximal/mid LAD and superior branch of the ramus artery as outlined below.  He underwent successful PCI/DES to the LAD.  It was recommended to treat the ramus branch medically given diameter of 2.5 mm and being  diseased in 2 different areas with consideration for PCI for refractory angina despite pharmacotherapy.   Followed up in the office on 01/14/2024 continuing to note intermittent chest discomfort, particularly when laying on his left or right sides.  He also continues to note intermittent palpitations.  To escalate antianginal therapy he was started on Toprol -XL 12.5 mg.  Zio patch showed a predominant rhythm of sinus with an average rate of 66 bpm (range 46 to 120 bpm) with frequent PVCs with an overall burden of 6.4%.  He contacted our office continuing to have intermittent chest discomfort and was started on Imdur  with subsequent MyChart message noting headache approximately 1 hour after taking this.  Recently seen in the office on 5/16 and continued to note exertional substernal chest discomfort with associated dyspnea.  Options were discussed as far as titrating medical therapy versus repeat cardiac catheterization.  He was ultimately set up for outpatient cardiac catheterization.  Continued on aspirin , Plavix , atorvastatin , metoprolol  XL.   Hospital Course     The patient underwent cardiac cath as noted above with widely patent LAD stent with no significant in-stent restenosis, successful angioplasty and DES x 2 in overlapping fashion to ramus artery. Plan for DAPT with ASA/Plavix  lifelong if tolerated. The patient was seen by cardiac rehab while in short stay. There were no observed complications post cath. Radial cath site was re-evaluated prior to discharge and found to be stable without any complications. Instructions/precautions regarding cath site care were given prior to discharge.  Chase Jensen was seen by Dr. Alvenia Jensen and determined stable for discharge home. Follow up with our office has been arranged. Medications are listed below. Pertinent changes include n/a.  _____________  Cath/PCI Registry Performance & Quality Measures: Aspirin  prescribed? - Yes ADP Receptor Inhibitor  (Plavix /Clopidogrel , Brilinta/Ticagrelor or Effient/Prasugrel) prescribed (includes medically managed patients)? - Yes High Intensity Statin (Lipitor 40-80mg  or Crestor 20-40mg ) prescribed? - Yes For EF <40%, was ACEI/ARB prescribed? - Not Applicable (EF >/= 40%) For EF <40%, Aldosterone Antagonist (Spironolactone or Eplerenone) prescribed? - Not Applicable (EF >/= 40%) Cardiac Rehab Phase II ordered (Included Medically managed Patients)? - Yes  _____________   Discharge Vitals Blood pressure (!) 151/80, pulse 60, temperature 98.1 F (36.7 C), temperature source Oral, resp. rate 19, height 6\' 1"  (1.854 m), weight 91.2 kg, SpO2 98%.  Filed Weights   02/26/24 0723  Weight: 91.2 kg    Last Labs & Radiologic Studies    CBC No results for input(s): "WBC", "NEUTROABS", "HGB", "HCT", "MCV", "PLT" in the last 72 hours. Basic Metabolic Panel No results for input(s): "NA", "K", "CL", "CO2", "GLUCOSE", "BUN", "CREATININE", "CALCIUM ", "MG", "PHOS" in the last 72 hours. Liver Function Tests No results for input(s): "AST", "ALT", "ALKPHOS", "BILITOT", "PROT", "ALBUMIN" in the last 72 hours. No results for input(s): "LIPASE", "AMYLASE" in the last 72 hours. High Sensitivity Troponin:   No results for input(s): "TROPONINIHS" in the last 720 hours.  BNP Invalid input(s): "POCBNP" D-Dimer No results for input(s): "DDIMER" in the last 72 hours. Hemoglobin A1C No results for input(s): "HGBA1C" in the last 72 hours. Fasting Lipid Panel No results for input(s): "CHOL", "HDL", "LDLCALC", "  TRIG", "CHOLHDL", "LDLDIRECT" in the last 72 hours. Thyroid  Function Tests No results for input(s): "TSH", "T4TOTAL", "T3FREE", "THYROIDAB" in the last 72 hours.  Invalid input(s): "FREET3" _____________  CARDIAC CATHETERIZATION Addendum Date: 02/26/2024   Dist LAD lesion is 40% stenosed.   Prox RCA lesion is 20% stenosed.   1st Sept lesion is 90% stenosed.   RPAV lesion is 30% stenosed.   RPDA lesion is 30%  stenosed.   Ramus-2 lesion is 99% stenosed.   Ramus-1 lesion is 95% stenosed.   Ramus-3 lesion is 60% stenosed.   Non-stenotic Prox LAD to Mid LAD lesion was previously treated.   A drug-eluting stent was successfully placed using a STENT SYNERGY XD 2.25X28.   A drug-eluting stent was successfully placed using a STENT SYNERGY XD 2.50X20.   Post intervention, there is a 0% residual stenosis.   Post intervention, there is a 0% residual stenosis.   The left ventricular systolic function is normal.   LV end diastolic pressure is mildly elevated.   The left ventricular ejection fraction is 55-65% by visual estimate.   In the absence of any other complications or medical issues, we expect the patient to be ready for discharge from an interventional cardiology perspective on 02/26/2024.   Recommend dual antiplatelet therapy with Aspirin  81mg  daily and Clopidogrel  75mg  daily. 1.  Widely patent LAD stent with no significant restenosis.  Worsening disease in the superior branch of the ramus artery. 2.  Normal LV systolic function and mildly elevated left ventricular end-diastolic pressure. 3.  Successful angioplasty and 2 overlapped drug-eluting stent placement to the superior branch of the ramus artery. Recommendations: Lifelong dual antiplatelet therapy if tolerated. The patient has aggressive disease.  Recommend a target LDL of less than 55 with a low threshold for using a PCSK9 inhibitor. Continue management of PVCs.  Result Date: 02/26/2024   Dist LAD lesion is 40% stenosed.   Prox RCA lesion is 20% stenosed.   1st Sept lesion is 90% stenosed.   RPAV lesion is 30% stenosed.   RPDA lesion is 30% stenosed.   Ramus-2 lesion is 99% stenosed.   Ramus-1 lesion is 95% stenosed.   Non-stenotic Prox LAD to Mid LAD lesion was previously treated.   A drug-eluting stent was successfully placed using a STENT SYNERGY XD 2.25X28.   A drug-eluting stent was successfully placed using a STENT SYNERGY XD 2.50X20.   Post intervention,  there is a 0% residual stenosis.   Post intervention, there is a 0% residual stenosis.   The left ventricular systolic function is normal.   LV end diastolic pressure is mildly elevated.   The left ventricular ejection fraction is 55-65% by visual estimate.   In the absence of any other complications or medical issues, we expect the patient to be ready for discharge from an interventional cardiology perspective on 02/26/2024.   Recommend dual antiplatelet therapy with Aspirin  81mg  daily and Clopidogrel  75mg  daily. 1.  Widely patent LAD stent with no significant restenosis.  Worsening disease in the superior branch of the ramus artery. 2.  Normal LV systolic function and mildly elevated left ventricular end-diastolic pressure. 3.  Successful angioplasty and 2 overlapped drug-eluting stent placement to the superior branch of the ramus artery. Recommendations: Lifelong dual antiplatelet therapy if tolerated. The patient has aggressive disease.  Recommend a target LDL of less than 55 with a low threshold for using a PCSK9 inhibitor. Continue management of PVCs.   LONG TERM MONITOR (3-14 DAYS) Result Date: 02/24/2024 Patch Wear  Time:  13 days and 22 hours (2025-04-13T08:46:03-399 to 2025-04-27T07:42:03-399) Patient had a min HR of 46 bpm, max HR of 120 bpm, and avg HR of 66 bpm. Predominant underlying rhythm was Sinus Rhythm. Rare PACs and rare PVCs.  No significant arrhythmia overall.   Disposition   Pt is being discharged home today in good condition.  Follow-up Plans & Appointments     Follow-up Information     Roark Chick, PA-C Follow up on 03/16/2024.   Specialties: Cardiology, Radiology Contact information: [address] [place] [address] [phone]                Discharge Instructions     AMB Referral to Cardiac Rehabilitation - Phase II   Complete by: As directed    Diagnosis:  Coronary Stents Stable Angina     After initial evaluation and assessments  completed: Virtual Based Care may be provided alone or in conjunction with Phase 2 Cardiac Rehab based on patient barriers.: Yes   Intensive Cardiac Rehabilitation (ICR) [facility] location only OR Traditional Cardiac Rehabilitation (TCR) *If criteria for ICR are not met will enroll in TCR [facility] only): Yes        Discharge Medications   Allergies as of 02/26/2024       Reactions   Imdur  [isosorbide  Nitrate]    Headache    Zetia  [ezetimibe ]    Leg cramps   Penicillins Rash   Propoxyphene Rash   Darvocet N Acetaminophen         Medication List     TAKE these medications    aspirin  EC 81 MG tablet Take 81 mg by mouth daily. Swallow whole.   atorvastatin  80 MG tablet Commonly known as: LIPITOR Take 1 tablet (80 mg total) by mouth daily.   clopidogrel  75 MG tablet Commonly known as: Plavix  Take 1 tablet (75 mg total) by mouth daily.   fluticasone 50 MCG/ACT nasal spray Commonly known as: FLONASE Place 1 spray into the nose daily as needed for allergies.   levothyroxine  150 MCG tablet Commonly known as: SYNTHROID  Take 150 mcg by mouth daily before breakfast.   metoprolol  succinate 25 MG 24 hr tablet Commonly known as: Toprol  XL Take 0.5 tablets (12.5 mg total) by mouth at bedtime.   nitroGLYCERIN  0.4 MG SL tablet Commonly known as: Nitrostat  Place 1 tablet (0.4 mg total) under the tongue every 5 (five) minutes as needed for chest pain.   OVER THE COUNTER MEDICATION Take 1 packet by mouth daily. Multivitamin Packet - Multivitamin, Fish Oil, Omega Blend, Turmeric, Coq10, Vit K2, Boron   SUMAtriptan 100 MG tablet Commonly known as: IMITREX Take 100 mg by mouth every 2 (two) hours as needed for migraine.   VITAMIN C PO Take 1,000 mg by mouth daily.           Allergies Allergies  Allergen Reactions   Imdur  [Isosorbide  Nitrate]     Headache    Zetia  [Ezetimibe ]     Leg cramps   Penicillins Rash   Propoxyphene Rash    Darvocet N Acetaminophen     Outstanding  Labs/Studies   N/a   Duration of Discharge Encounter   APP time: 20 minutes   Signed, Johnie Nailer, NP 02/26/2024, 1:06 PM

## 2024-02-26 NOTE — Progress Notes (Signed)
Up and walked and tolerated well; no c/o chest pain or shortness of breath

## 2024-02-26 NOTE — Progress Notes (Signed)
 CARDIAC REHAB PHASE I     Post stent education including site care, restrictions, risk factors, exercise guidelines, NTG use, antiplatelet therapy importance, heart healthy diet and CRP2 reviewed. All questions and concerns addressed. Will refer to [facility] to for CRP2, currently enrolled with program there. Plan for home later today.    [id number] Ronny Colas, RN BSN 02/26/2024 1:59 PM

## 2024-02-26 NOTE — Interval H&P Note (Signed)
 History and Physical Interval Note:  02/26/2024 8:37 AM  Chase Jensen  has presented today for surgery, with the diagnosis of nstemi.  The various methods of treatment have been discussed with the patient and family. After consideration of risks, benefits and other options for treatment, the patient has consented to  Procedure(s): LEFT HEART CATH AND CORONARY ANGIOGRAPHY (N/A) as a surgical intervention.  The patient's history has been reviewed, patient examined, no change in status, stable for surgery.  I have reviewed the patient's chart and labs.  Questions were answered to the patient's satisfaction.     Chase Jensen

## 2024-03-01 ENCOUNTER — Telehealth: Payer: Self-pay

## 2024-03-01 ENCOUNTER — Telehealth: Payer: Self-pay | Admitting: Student in an Organized Health Care Education/Training Program

## 2024-03-01 NOTE — Telephone Encounter (Signed)
 TELEPHONE CALL WITH ON-CALL CARDIOLOGY PHYSICIAN   Chase Jensen has palpitations and states that these episodes correlate to his galaxy watch has alerting him to atrial fibrillation. The palpitations began two days prior and last 10-30 seconds. He has also been having a dull ache rates 1-3/10  in his chest that is not related to exertion. The pressure worsens with the palpitations. He is calling to inquire about this two concerns. He recently had a 14 day ambulatory monitor completed in may 2025 with revealed 5.4% burden of PVCs and some PACs. It is unclear if these two rhythms are what the watch is catching or if he truly does have new onset atrial fibrillation.   Ambulatory blood pressures range on home cuff:127/72-132/75 Resting HR range on watch: mid 60-70s.   We will increase the metoprolol  succinate from 12.5 mg to 25 mg to see if it improves the palpitations. We did discuss that his heart rate at baseline is low and so an increased dose may decrease his HR into the 50s and be associated with increased fatigue. If this occurs, then I would recommend returning back to the half tablet of metoprolol . Regarding the chest pressure, he states that he feels comfortable addressing it with his cardiologist in the office. I instructed him that his symptoms worsen to report to the ED.   I will send a message to his cardiology team to schedule an outpatient appointment   Velvet Gibbs, MD MS  [facility]

## 2024-03-09 ENCOUNTER — Other Ambulatory Visit (HOSPITAL_COMMUNITY): Payer: Self-pay

## 2024-03-11 ENCOUNTER — Encounter: Payer: Self-pay | Admitting: *Deleted

## 2024-03-11 DIAGNOSIS — Z955 Presence of coronary angioplasty implant and graft: Secondary | ICD-10-CM

## 2024-03-11 NOTE — Progress Notes (Signed)
 Cardiac Individual Treatment Plan  Patient Details  Name: Chase Jensen MRN: [id number] Date of Birth: [birth year] Referring Provider:   Flowsheet Row Cardiac Rehab from 01/21/2024 in [facility] Cardiac and Pulmonary Rehab  Referring Provider Dr. Nestor Banter       Initial Encounter Date:  Flowsheet Row Cardiac Rehab from 01/21/2024 in [facility] Cardiac and Pulmonary Rehab  Date 01/21/24       Visit Diagnosis: Status post coronary artery stent placement  Patient's Home Medications on Admission:  Current Outpatient Medications:    Ascorbic Acid (VITAMIN C PO), Take 1,000 mg by mouth daily., Disp: , Rfl:    aspirin  81 MG EC tablet, Take 81 mg by mouth daily. Swallow whole., Disp: , Rfl:    atorvastatin  (LIPITOR ) 80 MG tablet, Take 1 tablet (80 mg total) by mouth daily., Disp: 90 tablet, Rfl: 3   clopidogrel  (PLAVIX ) 75 MG tablet, Take 1 tablet (75 mg total) by mouth daily., Disp: 90 tablet, Rfl: 2   fluticasone  (FLONASE ) 50 MCG/ACT nasal spray, Place 1 spray into the nose daily as needed for allergies., Disp: , Rfl:    levothyroxine  (SYNTHROID ) 150 MCG tablet, Take 150 mcg by mouth daily before breakfast., Disp: , Rfl:    metoprolol  succinate (TOPROL  XL) 25 MG 24 hr tablet, Take 0.5 tablets (12.5 mg total) by mouth at bedtime., Disp: 45 tablet, Rfl: 3   nitroGLYCERIN  (NITROSTAT ) 0.4 MG SL tablet, Place 1 tablet (0.4 mg total) under the tongue every 5 (five) minutes as needed for chest pain., Disp: 25 tablet, Rfl: 2   OVER THE COUNTER MEDICATION, Take 1 packet by mouth daily. Multivitamin Packet - Multivitamin, Fish Oil, Omega Blend, Turmeric, Coq10, Vit K2, Boron, Disp: , Rfl:    SUMAtriptan  (IMITREX ) 100 MG tablet, Take 100 mg by mouth every 2 (two) hours as needed for migraine., Disp: , Rfl:   Past Medical History: Past Medical History:  Diagnosis Date   Coronary artery disease 2006    balloon angioplasty without stent placement to one-vessel done at [facility] in [place]      Hyperlipemia    Hypertension    Hypothyroidism     Tobacco Use: Social History   Tobacco Use  Smoking Status Never  Smokeless Tobacco Never    Labs: Review Flowsheet       Latest Ref Rng & Units 08/07/2019 11/21/2023  Labs for ITP Cardiac and Pulmonary Rehab  Cholestrol 100 - 199 mg/dL 409  811   LDL (calc) 0 - 99 mg/dL 51  85   HDL-C >91 mg/dL 47  48   Trlycerides 0 - 149 mg/dL 87  478      Exercise Target Goals: Exercise Program Goal: Individual exercise prescription set using results from initial 6 min walk test and THRR while considering  patient's activity barriers and safety.   Exercise Prescription Goal: Initial exercise prescription builds to 30-45 minutes a day of aerobic activity, 2-3 days per week.  Home exercise guidelines will be given to patient during program as part of exercise prescription that the participant will acknowledge.   Education: Aerobic Exercise: - Group verbal and visual presentation on the components of exercise prescription. Introduces F.I.T.T principle from ACSM for exercise prescriptions.  Reviews F.I.T.T. principles of aerobic exercise including progression. Written material given at graduation.   Education: Resistance Exercise: - Group verbal and visual presentation on the components of exercise prescription. Introduces F.I.T.T principle from ACSM for exercise prescriptions  Reviews F.I.T.T. principles of resistance exercise including progression. Written  material given at graduation.    Education: Exercise & Equipment Safety: - Individual verbal instruction and demonstration of equipment use and safety with use of the equipment. Flowsheet Row Cardiac Rehab from 02/19/2024 in [facility] Cardiac and Pulmonary Rehab  Date 01/21/24  Educator [facility]  Instruction Review Code 1- Verbalizes Understanding       Education: Exercise Physiology & General Exercise Guidelines: - Group verbal and written instruction with models to review the exercise  physiology of the cardiovascular system and associated critical values. Provides general exercise guidelines with specific guidelines to those with heart or lung disease.  Flowsheet Row Cardiac Rehab from 02/19/2024 in [facility] Cardiac and Pulmonary Rehab  Date 02/19/24  Educator [facility]  Instruction Review Code 1- [employer] Understanding       Education: Flexibility, Balance, Mind/Body Relaxation: - Group verbal and visual presentation with interactive activity on the components of exercise prescription. Introduces F.I.T.T principle from ACSM for exercise prescriptions. Reviews F.I.T.T. principles of flexibility and balance exercise training including progression. Also discusses the mind body connection.  Reviews various relaxation techniques to help reduce and manage stress (i.e. Deep breathing, progressive muscle relaxation, and visualization). Balance handout provided to take home. Written material given at graduation.   Activity Barriers & Risk Stratification:  Activity Barriers & Cardiac Risk Stratification - 01/02/24 1345       Activity Barriers & Cardiac Risk Stratification   Activity Barriers None    Cardiac Risk Stratification Moderate             6 Minute Walk:  6 Minute Walk     Row Name 01/21/24 1457         6 Minute Walk   Phase Initial     Distance 1550 feet     Walk Time 6 minutes     # of Rest Breaks 0     MPH 2.9     METS 3.8     RPE 9     Perceived Dyspnea  0     Symptoms No     Resting HR 71 bpm     Resting BP 116/62     Resting Oxygen Saturation  96 %     Exercise Oxygen Saturation  during 6 min walk 98 %     Max Ex. HR 88 bpm     Max Ex. BP 126/64     2 Minute Post BP 122/62              Oxygen Initial Assessment:   Oxygen Re-Evaluation:   Oxygen Discharge (Final Oxygen Re-Evaluation):   Initial Exercise Prescription:  Initial Exercise Prescription - 01/21/24 1400       Date of Initial Exercise RX and Referring Provider   Date  01/21/24    Referring Provider Dr. Nestor Banter      Oxygen   Maintain Oxygen Saturation 88% or higher      Treadmill   MPH 2.8    Grade 1    Minutes 15    METs 3.53      Elliptical   Level 1    Speed 3    Minutes 15    METs 3.76      REL-XR   Level 3    Watts 25    Speed 50    Minutes 15    METs 3.76      Rower   Level 1    Watts 25    Minutes 15    METs 3.76  Intensity   THRR 40-80% of Max Heartrate 100-130    Ratings of Perceived Exertion 11-13    Perceived Dyspnea 0-4      Resistance Training   Training Prescription Yes    Weight 8lb    Reps 10-15             Perform Capillary Blood Glucose checks as needed.  Exercise Prescription Changes:   Exercise Prescription Changes     Row Name 01/21/24 1500 02/11/24 1500 02/27/24 1800         Response to Exercise   Blood Pressure (Admit) 116/62 134/62 108/62     Blood Pressure (Exercise) 126/64 162/60 158/82     Blood Pressure (Exit) 122/62 104/60 106/70     Heart Rate (Admit) 71 bpm 81 bpm 102 bpm     Heart Rate (Exercise) 88 bpm 135 bpm 136 bpm     Heart Rate (Exit) 67 bpm 84 bpm 91 bpm     Oxygen Saturation (Admit) 96 % -- --     Oxygen Saturation (Exercise) 99 % -- --     Oxygen Saturation (Exit) 99 % -- --     Rating of Perceived Exertion (Exercise) 9 13 13      Perceived Dyspnea (Exercise) 0 -- --     Symptoms none none none     Comments results First two weeks of exercise' --     Duration -- Continue with 30 min of aerobic exercise without signs/symptoms of physical distress. Continue with 30 min of aerobic exercise without signs/symptoms of physical distress.     Intensity -- THRR unchanged THRR unchanged       Progression   Progression -- Continue to progress workloads to maintain intensity without signs/symptoms of physical distress. Continue to progress workloads to maintain intensity without signs/symptoms of physical distress.     Average METs -- 3.83 4.46       Resistance  Training   Training Prescription -- Yes Yes     Weight -- 8 lb 8 lb     Reps -- 10-15 10-15       Interval Training   Interval Training -- No No       Treadmill   MPH -- 3 3.5     Grade -- 3 3     Minutes -- 15 15     METs -- 4.54 5.13       Elliptical   Level -- 2 3     Speed -- 3.4 3.7     Minutes -- 15 15     METs -- 4.1 4.1       Rower   Level -- -- 10     Watts -- -- 85     Minutes -- -- 15       Oxygen   Maintain Oxygen Saturation -- 88% or higher 88% or higher              Exercise Comments:   Exercise Comments     Row Name 01/27/24 1355 02/20/24 1359         Exercise Comments First full day of exercise!  Patient was oriented to gym and equipment including functions, settings, policies, and procedures.  Patient's individual exercise prescription and treatment plan were reviewed.  All starting workloads were established based on the results of the 6 minute walk test done at initial orientation visit.  The plan for exercise progression was also introduced and progression will be customized based on patient's  performance and goals. --               Exercise Goals and Review:   Exercise Goals     Row Name 01/21/24 1502             Exercise Goals   Increase Physical Activity Yes       Intervention Provide advice, education, support and counseling about physical activity/exercise needs.;Develop an individualized exercise prescription for aerobic and resistive training based on initial evaluation findings, risk stratification, comorbidities and participant's personal goals.       Expected Outcomes Short Term: Attend rehab on a regular basis to increase amount of physical activity.;Long Term: Add in home exercise to make exercise part of routine and to increase amount of physical activity.;Long Term: Exercising regularly at least 3-5 days a week.       Increase Strength and Stamina Yes       Intervention Provide advice, education, support and counseling  about physical activity/exercise needs.;Develop an individualized exercise prescription for aerobic and resistive training based on initial evaluation findings, risk stratification, comorbidities and participant's personal goals.       Expected Outcomes Short Term: Increase workloads from initial exercise prescription for resistance, speed, and METs.;Short Term: Perform resistance training exercises routinely during rehab and add in resistance training at home;Long Term: Improve cardiorespiratory fitness, muscular endurance and strength as measured by increased METs and functional capacity ( )       Able to understand and use rate of perceived exertion (RPE) scale Yes       Intervention Provide education and explanation on how to use RPE scale       Expected Outcomes Short Term: Able to use RPE daily in rehab to express subjective intensity level;Long Term:  Able to use RPE to guide intensity level when exercising independently       Able to understand and use Dyspnea scale Yes       Intervention Provide education and explanation on how to use Dyspnea scale       Expected Outcomes Short Term: Able to use Dyspnea scale daily in rehab to express subjective sense of shortness of breath during exertion;Long Term: Able to use Dyspnea scale to guide intensity level when exercising independently       Knowledge and understanding of Target Heart Rate Range (THRR) Yes       Intervention Provide education and explanation of THRR including how the numbers were predicted and where they are located for reference       Expected Outcomes Short Term: Able to state/look up THRR;Short Term: Able to use daily as guideline for intensity in rehab;Long Term: Able to use THRR to govern intensity when exercising independently       Able to check pulse independently Yes       Intervention Provide education and demonstration on how to check pulse in carotid and radial arteries.;Review the importance of being able to check your  own pulse for safety during independent exercise       Expected Outcomes Short Term: Able to explain why pulse checking is important during independent exercise;Long Term: Able to check pulse independently and accurately       Understanding of Exercise Prescription Yes       Intervention Provide education, explanation, and written materials on patient's individual exercise prescription       Expected Outcomes Short Term: Able to explain program exercise prescription;Long Term: Able to explain home exercise prescription to exercise independently  Exercise Goals Re-Evaluation :  Exercise Goals Re-Evaluation     Row Name 01/27/24 1355 02/11/24 1521 02/27/24 1802         Exercise Goal Re-Evaluation   Exercise Goals Review Able to understand and use rate of perceived exertion (RPE) scale;Increase Physical Activity;Understanding of Exercise Prescription;Increase Strength and Stamina;Able to check pulse independently;Knowledge and understanding of Target Heart Rate Range (THRR) Increase Physical Activity;Increase Strength and Stamina;Understanding of Exercise Prescription Increase Physical Activity;Increase Strength and Stamina;Understanding of Exercise Prescription     Comments Reviewed RPE and dyspnea scale, THR and program prescription with pt today.  Pt voiced understanding and was given a copy of goals to take home. Nickolai is off to a good start in the program. During his first two weeks in rehab he was able to increase his treadmill workload to a speed of 3 mph with an incline of 3%. He also improved to level 2 on the elliptical at a speed of 3.4 mph. We will continue to monitor his progress in the program. Demontez is doing well in rehab. He was able to increase his workload on the treadmill to a speed of 3.5 mph and incline of 3%. He also increased his workload on the elliptical to level 3 with a speed of 3.21mph. He also increased to level 10 on the rower with an average watts of 85.  He will be out on medical hold starting the week of 5/19. We will continue to monitor his progress in the program.     Expected Outcomes Short: Use RPE daily to regulate intensity.  Long: Follow program prescription in THR. -- Short: Continue to follow current exercise prescription. Long: Continue exercise to improve strength and stamina.              Discharge Exercise Prescription (Final Exercise Prescription Changes):  Exercise Prescription Changes - 02/27/24 1800       Response to Exercise   Blood Pressure (Admit) 108/62    Blood Pressure (Exercise) 158/82    Blood Pressure (Exit) 106/70    Heart Rate (Admit) 102 bpm    Heart Rate (Exercise) 136 bpm    Heart Rate (Exit) 91 bpm    Rating of Perceived Exertion (Exercise) 13    Symptoms none    Duration Continue with 30 min of aerobic exercise without signs/symptoms of physical distress.    Intensity THRR unchanged      Progression   Progression Continue to progress workloads to maintain intensity without signs/symptoms of physical distress.    Average METs 4.46      Resistance Training   Training Prescription Yes    Weight 8 lb    Reps 10-15      Interval Training   Interval Training No      Treadmill   MPH 3.5    Grade 3    Minutes 15    METs 5.13      Elliptical   Level 3    Speed 3.7    Minutes 15    METs 4.1      Rower   Level 10    Watts 85    Minutes 15      Oxygen   Maintain Oxygen Saturation 88% or higher             Nutrition:  Target Goals: Understanding of nutrition guidelines, daily intake of sodium 1500mg , cholesterol 200mg , calories 30% from fat and 7% or less from saturated fats, daily to have 5 or more servings  of fruits and vegetables.  Education: All About Nutrition: -Group instruction provided by verbal, written material, interactive activities, discussions, models, and posters to present general guidelines for heart healthy nutrition including fat, fiber, MyPlate, the role of  sodium in heart healthy nutrition, utilization of the nutrition label, and utilization of this knowledge for meal planning. Follow up email sent as well. Written material given at graduation. Flowsheet Row Cardiac Rehab from 02/19/2024 in [facility] Cardiac and Pulmonary Rehab  Education need identified 01/21/24       Biometrics:  Pre Biometrics - 01/21/24 1503       Pre Biometrics   Height 6' (1.829 m)    Weight 205 lb 1.6 oz (93 kg)    Waist Circumference 38.5 inches    Hip Circumference 39.5 inches    Waist to Hip Ratio 0.97 %    BMI (Calculated) 27.81    Single Leg Stand 30 seconds              Nutrition Therapy Plan and Nutrition Goals:   Nutrition Assessments:  MEDIFICTS Score Key: >=70 Need to make dietary changes  40-70 Heart Healthy Diet <= 40 Therapeutic Level Cholesterol Diet  Flowsheet Row Cardiac Rehab from 01/21/2024 in [facility] Cardiac and Pulmonary Rehab  Picture Your Plate Total Score on Admission 70      Picture Your Plate Scores: <16 Unhealthy dietary pattern with much room for improvement. 41-50 Dietary pattern unlikely to meet recommendations for good health and room for improvement. 51-60 More healthful dietary pattern, with some room for improvement.  >60 Healthy dietary pattern, although there may be some specific behaviors that could be improved.    Nutrition Goals Re-Evaluation:  Nutrition Goals Re-Evaluation     Row Name 02/19/24 1355             Goals   Nutrition Goal Veniamin has scheduled an appointment to meet with the RD in the coming weeks. He does not have any specific questions, but would like to learn more about what a heart healthy diet should look like.       Comment Short: Meet with RD. Long: Implement heart healthy eating patterns discussed with RD.                Nutrition Goals Discharge (Final Nutrition Goals Re-Evaluation):  Nutrition Goals Re-Evaluation - 02/19/24 1355       Goals   Nutrition Goal Kordae has  scheduled an appointment to meet with the RD in the coming weeks. He does not have any specific questions, but would like to learn more about what a heart healthy diet should look like.    Comment Short: Meet with RD. Long: Implement heart healthy eating patterns discussed with RD.             Psychosocial: Target Goals: Acknowledge presence or absence of significant depression and/or stress, maximize coping skills, provide positive support system. Participant is able to verbalize types and ability to use techniques and skills needed for reducing stress and depression.   Education: Stress, Anxiety, and Depression - Group verbal and visual presentation to define topics covered.  Reviews how body is impacted by stress, anxiety, and depression.  Also discusses healthy ways to reduce stress and to treat/manage anxiety and depression.  Written material given at graduation.   Education: Sleep Hygiene -Provides group verbal and written instruction about how sleep can affect your health.  Define sleep hygiene, discuss sleep cycles and impact of sleep habits. Review good sleep hygiene tips.  Initial Review & Psychosocial Screening:  Initial Psych Review & Screening - 01/02/24 1345       Initial Review   Current issues with None Identified      Family Dynamics   Good Support System? Yes   wife,   daughter 3 hours away, 2 sons out of state.     Barriers   Psychosocial barriers to participate in program There are no identifiable barriers or psychosocial needs.      Screening Interventions   Interventions To provide support and resources with identified psychosocial needs;Provide feedback about the scores to participant    Expected Outcomes Short Term goal: Utilizing psychosocial counselor, staff and physician to assist with identification of specific Stressors or current issues interfering with healing process. Setting desired goal for each stressor or current issue identified.;Long Term  Goal: Stressors or current issues are controlled or eliminated.;Short Term goal: Identification and review with participant of any Quality of Life or Depression concerns found by scoring the questionnaire.;Long Term goal: The participant improves quality of Life and PHQ9 Scores as seen by post scores and/or verbalization of changes             Quality of Life Scores:   Quality of Life - 01/21/24 1503       Quality of Life   Select Quality of Life      Quality of Life Scores   Health/Function Pre 24.3 %    Socioeconomic Pre 22.88 %    Psych/Spiritual Pre 24.64 %    Family Pre 25.2 %    GLOBAL Pre 24.17 %            Scores of 19 and below usually indicate a poorer quality of life in these areas.  A difference of  2-3 points is a clinically meaningful difference.  A difference of 2-3 points in the total score of the Quality of Life Index has been associated with significant improvement in overall quality of life, self-image, physical symptoms, and general health in studies assessing change in quality of life.  PHQ-9: Review Flowsheet       01/21/2024  Depression screen PHQ 2/9  Decreased Interest 0  Down, Depressed, Hopeless 0  PHQ - 2 Score 0  Altered sleeping 0  Tired, decreased energy 1  Change in appetite 0  Feeling bad or failure about yourself  0  Trouble concentrating 0  Moving slowly or fidgety/restless 0  Suicidal thoughts 0  PHQ-9 Score 1  Difficult doing work/chores Not difficult at all   Interpretation of Total Score  Total Score Depression Severity:  1-4 = Minimal depression, 5-9 = Mild depression, 10-14 = Moderate depression, 15-19 = Moderately severe depression, 20-27 = Severe depression   Psychosocial Evaluation and Intervention:  Psychosocial Evaluation - 01/02/24 1357       Psychosocial Evaluation & Interventions   Interventions Encouraged to exercise with the program and follow exercise prescription    Comments There are no barriers to  attending the program.  He is ready to  work improving his lifestyle to be able tomanage his heart disease.  He has been on a weight loss journey since last Noverber,; loosing almost 30 lbs to today.  HAs a goal of a few more pounds to lose. He lives with his wife and she and their family are his support.   a daughter lives 3 hours away and 2 sons live [place]. He had to delay his start date because his wife's father was disgnosed with  pancreatic cancer and they are going to [place]  to see him and her mother (who has dementia). He is ready to start the program once he returns home.    Expected Outcomes STG attend all scheduled sessions, continue weight loss to goal, follow exercise guidelines working on exercise progression.   LTG able to continue on weight loss/maintainence and exercise progression after discharge    Continue Psychosocial Services  Follow up required by staff             Psychosocial Re-Evaluation:  Psychosocial Re-Evaluation     Row Name 02/19/24 1401             Psychosocial Re-Evaluation   Current issues with None Identified       Comments Zebulan reports no major stressors at this time. He states that his wife is a good support for him, as well as church and family. He enjoys exercising, outdoor activities, and being around other people for stress relief. Mancil states that he is sleeping well at this time, and gets between 6-7 hours a night.       Expected Outcomes Short: Continue to exercise for stress relief. Long: Continue to relieve stress through healthy avenues.       Interventions Encouraged to attend Cardiac Rehabilitation for the exercise       Continue Psychosocial Services  Follow up required by staff                Psychosocial Discharge (Final Psychosocial Re-Evaluation):  Psychosocial Re-Evaluation - 02/19/24 1401       Psychosocial Re-Evaluation   Current issues with None Identified    Comments Devon reports no major stressors at this time.  He states that his wife is a good support for him, as well as church and family. He enjoys exercising, outdoor activities, and being around other people for stress relief. Caymen states that he is sleeping well at this time, and gets between 6-7 hours a night.    Expected Outcomes Short: Continue to exercise for stress relief. Long: Continue to relieve stress through healthy avenues.    Interventions Encouraged to attend Cardiac Rehabilitation for the exercise    Continue Psychosocial Services  Follow up required by staff             Vocational Rehabilitation: Provide vocational rehab assistance to qualifying candidates.   Vocational Rehab Evaluation & Intervention:   Education: Education Goals: Education classes will be provided on a variety of topics geared toward better understanding of heart health and risk factor modification. Participant will state understanding/return demonstration of topics presented as noted by education test scores.  Learning Barriers/Preferences:   General Cardiac Education Topics:  AED/CPR: - Group verbal and written instruction with the use of models to demonstrate the basic use of the AED with the basic ABC's of resuscitation.   Anatomy and Cardiac Procedures: - Group verbal and visual presentation and models provide information about basic cardiac anatomy and function. Reviews the testing methods done to diagnose heart disease and the outcomes of the test results. Describes the treatment choices: Medical Management, Angioplasty, or Coronary Bypass Surgery for treating various heart conditions including Myocardial Infarction, Angina, Valve Disease, and Cardiac Arrhythmias.  Written material given at graduation.   Medication Safety: - Group verbal and visual instruction to review commonly prescribed medications for heart and lung disease. Reviews the medication, class of the drug, and side effects. Includes the steps to properly store meds and maintain  the prescription regimen.  Written  material given at graduation.   Intimacy: - Group verbal instruction through game format to discuss how heart and lung disease can affect sexual intimacy. Written material given at graduation..   Know Your Numbers and Heart Failure: - Group verbal and visual instruction to discuss disease risk factors for cardiac and pulmonary disease and treatment options.  Reviews associated critical values for Overweight/Obesity, Hypertension, Cholesterol, and Diabetes.  Discusses basics of heart failure: signs/symptoms and treatments.  Introduces Heart Failure Zone chart for action plan for heart failure.  Written material given at graduation. Flowsheet Row Cardiac Rehab from 02/19/2024 in [facility] Cardiac and Pulmonary Rehab  Education need identified 01/21/24  Date 01/29/24  Educator SB  Instruction Review Code 1- Verbalizes Understanding       Infection Prevention: - Provides verbal and written material to individual with discussion of infection control including proper hand washing and proper equipment cleaning during exercise session. Flowsheet Row Cardiac Rehab from 02/19/2024 in [facility] Cardiac and Pulmonary Rehab  Date 01/21/24  Educator [facility]  Instruction Review Code 1- Verbalizes Understanding       Falls Prevention: - Provides verbal and written material to individual with discussion of falls prevention and safety. Flowsheet Row Cardiac Rehab from 02/19/2024 in [facility] Cardiac and Pulmonary Rehab  Date 01/21/24  Educator [facility]  Instruction Review Code 1- Verbalizes Understanding       Other: -Provides group and verbal instruction on various topics (see comments)   Knowledge Questionnaire Score:  Knowledge Questionnaire Score - 01/21/24 1505       Knowledge Questionnaire Score   Pre Score 23/26             Core Components/Risk Factors/Patient Goals at Admission:  Personal Goals and Risk Factors at Admission - 01/21/24 1505       Core  Components/Risk Factors/Patient Goals on Admission    Weight Management Yes    Intervention Weight Management: Develop a combined nutrition and exercise program designed to reach desired caloric intake, while maintaining appropriate intake of nutrient and fiber, sodium and fats, and appropriate energy expenditure required for the weight goal.;Weight Management: Provide education and appropriate resources to help participant work on and attain dietary goals.    Admit Weight 205 lb 1.6 oz (93 kg)    Goal Weight: Short Term 200 lb (90.7 kg)    Goal Weight: Long Term 199 lb (90.3 kg)    Expected Outcomes Short Term: Continue to assess and modify interventions until short term weight is achieved;Long Term: Adherence to nutrition and physical activity/exercise program aimed toward attainment of established weight goal;Weight Loss: Understanding of general recommendations for a balanced deficit meal plan, which promotes 1-2 lb weight loss per week and includes a negative energy balance of [phone] kcal/d    Hypertension Yes    Intervention Provide education on lifestyle modifcations including regular physical activity/exercise, weight management, moderate sodium restriction and increased consumption of fresh fruit, vegetables, and low fat dairy, alcohol moderation, and smoking cessation.;Monitor prescription use compliance.    Expected Outcomes Short Term: Continued assessment and intervention until BP is < 140/32mm HG in hypertensive participants. < 130/81mm HG in hypertensive participants with diabetes, heart failure or chronic kidney disease.;Long Term: Maintenance of blood pressure at goal levels.    Lipids Yes    Intervention Provide education and support for participant on nutrition & aerobic/resistive exercise along with prescribed medications to achieve LDL 70mg , HDL >40mg .    Expected Outcomes Short Term: Participant states understanding of desired cholesterol values and is  compliant with medications  prescribed. Participant is following exercise prescription and nutrition guidelines.;Long Term: Cholesterol controlled with medications as prescribed, with individualized exercise RX and with personalized nutrition plan. Value goals: LDL < 70mg , HDL > 40 mg.             Education:Diabetes - Individual verbal and written instruction to review signs/symptoms of diabetes, desired ranges of glucose level fasting, after meals and with exercise. Acknowledge that pre and post exercise glucose checks will be done for 3 sessions at entry of program.   Core Components/Risk Factors/Patient Goals Review:   Goals and Risk Factor Review     Row Name 02/19/24 1357             Core Components/Risk Factors/Patient Goals Review   Personal Goals Review Hypertension;Weight Management/Obesity       Review Tyqwan states that he checks his BP regularly at home. He reports that his BP readings have stayed within normal ranges and he records them on an app to keep track. Vanderbilt states that he is comfortable with where his weight is at this time. He has lost 30 lbs since November of 2024 and would like to maintain his weight around 200 lb.       Expected Outcomes Short: Continue to monitor BP independently. Long: Continue to manage lifestyle risk factors.                Core Components/Risk Factors/Patient Goals at Discharge (Final Review):   Goals and Risk Factor Review - 02/19/24 1357       Core Components/Risk Factors/Patient Goals Review   Personal Goals Review Hypertension;Weight Management/Obesity    Review Jairus states that he checks his BP regularly at home. He reports that his BP readings have stayed within normal ranges and he records them on an app to keep track. Larren states that he is comfortable with where his weight is at this time. He has lost 30 lbs since November of 2024 and would like to maintain his weight around 200 lb.    Expected Outcomes Short: Continue to monitor BP independently.  Long: Continue to manage lifestyle risk factors.             ITP Comments:  ITP Comments     Row Name 01/02/24 1426 01/21/24 1456 01/27/24 1355 02/12/24 1312 02/20/24 1359   ITP Comments Virtual orientation call completed today. he has an appointment on Date: 01/21/2024  for EP eval and gym Orientation.  Documentation of diagnosis can be found in [facility] Date: 12/18/2023 . Completed and gym orientation for cardiac rehab. Initial ITP created and sent for review to Dr. Firman Hughes, Medical Director. First full day of exercise!  Patient was oriented to gym and equipment including functions, settings, policies, and procedures.  Patient's individual exercise prescription and treatment plan were reviewed.  All starting workloads were established based on the results of the 6 minute walk test done at initial orientation visit.  The plan for exercise progression was also introduced and progression will be customized based on patient's performance and goals. 30 Day review completed. Medical Director ITP review done, changes made as directed, and signed approval by Medical Director. New to program. --    Row Name 03/11/24 1108           ITP Comments 30 Day review completed. Medical Director ITP review done, changes made as directed, and signed approval by Medical Director.  Comments:

## 2024-03-15 NOTE — Progress Notes (Unsigned)
 Cardiology Office Note    Date:  03/16/2024   ID:  Chase Jensen, DOB 1966/06/01, MRN [id number]  PCP:  Chase Banter, MD  Cardiologist:  Antionette Kirks, MD  Electrophysiologist:  None   Chief Complaint: Follow up  History of Present Illness:   Chase Jensen is a 58 y.o. male with history of CAD status post balloon angioplasty without stent in 2006 in [place], [place]  s/p PCI/DES to the LAD in 12/2023 s/p PCI/DES x 2 to the superior branch of the ramus in 02/2024, dilated ascending thoracic aorta, frequent PVCs, HTN, severe hyperlipidemia, and family history of premature CAD with his father having CABG at age 59 who presents for follow-up of LHC.   Prior cardiology care in [place], [place] , and [place], [place] .  He underwent balloon angioplasty without stent placement in 2006 in [place], [place] .  At that time, no stent was placed due to bifurcation location.  He is a non-smoker.  He was last seen in the office in 07/2023 noting his lower extremity nocturnal cramping that felt similar to what he experienced while on ezetimibe .  He also noted lower extremity swelling that was progressive throughout the day and improved early in the morning.  He was without symptoms of shortness of breath or orthopnea.  Echo in 08/2023 showed an EF of 60 to 65%, no regional wall motion abnormalities, normal LV diastolic function parameters, normal RV systolic function and ventricular cavity size, tricuspid aortic valve, and mild dilatation of the ascending aorta measuring 39 mm.   He was seen in the office on 12/03/2023 noting an episode of exertional chest discomfort several days prior that improved after belching and rest.  In this setting he underwent coronary CTA on 12/12/2023 that showed a calcium  score of 267, which was the 85th percentile.  There was severe (greater than 70%) proximal LAD stenosis with less than 25% proximal LCx stenosis.  ctFFR 0.65 in the proximal LAD.  In this  setting he underwent LHC on 12/18/2023 that showed severe two-vessel CAD involving the proximal/mid LAD and superior branch of the ramus artery as outlined below.  He underwent successful PCI/DES to the LAD.  It was recommended to treat the ramus branch medically given diameter of 2.5 mm and being diseased in 2 different areas with consideration for PCI for refractory angina despite pharmacotherapy.   He followed up in the office on 01/14/2024 continuing to note intermittent chest discomfort, particularly when laying on his left or right sides.  He also continued to note intermittent palpitations.  To escalate antianginal therapy he was started on Toprol -XL 12.5 mg.  Zio patch showed a predominant rhythm of sinus with an average rate of 66 bpm (range 46 to 120 bpm) with frequent PVCs with an overall burden of 6.4%.  He contacted our office continuing to have intermittent chest discomfort and was started on Imdur  with subsequent MyChart message noting headache approximately 1 hour after taking this.  He was last seen in the office on 02/21/2024 continuing to have exertional substernal chest discomfort with associated dyspnea.  In this setting he underwent LHC on 02/26/2024 that showed widely patent LAD stent with no significant restenosis.  There was worsening disease in the superior branch of the ramus artery.  He underwent successful PCI/DES with 2-overlapping stents to the superior branch of the ramus artery.  Following the procedure, he did continue to note some intermittent palpitations leading Toprol -XL to be titrated to 25 mg daily with improvement in symptoms.  He  comes in accompanied by his wife today and is doing well from a cardiac perspective.  He has continued to improve following most recent PCI, particularly over the past couple of days.  He was able to go for a walk last evening for 2 miles without cardiac limitation.  Chest discomfort and palpitation burden improved.  Tolerating Toprol -XL 25 mg.   Adherent and tolerating DAPT with mild bruising.  Beginning to feel more like himself and is excited to return back to the gym.   Labs independently reviewed: 02/2024 - BUN 20, serum creatinine 1.05, potassium 4.8, Hgb 14.9, PLT 203 11/2023 - TSH 6.377, albumin 4.0, AST/ALT normal, TC 153, TG 107, HDL 48, LDL 85  Past Medical History:  Diagnosis Date   Coronary artery disease 2006    balloon angioplasty without stent placement to one-vessel done at [facility] in [place]     Hyperlipemia    Hypertension    Hypothyroidism     Past Surgical History:  Procedure Laterality Date   CARDIAC SURGERY     CHOLECYSTECTOMY     COLONOSCOPY WITH PROPOFOL  N/A 07/29/2015   Procedure: COLONOSCOPY WITH PROPOFOL ;  Surgeon: Luella Sager, MD;  Location: [facility] ENDOSCOPY;  Service: Endoscopy;  Laterality: N/A;   CORONARY ANGIOPLASTY     CORONARY STENT INTERVENTION N/A 12/18/2023   Procedure: CORONARY STENT INTERVENTION;  Surgeon: Wenona Hamilton, MD;  Location: [facility] INVASIVE CV LAB;  Service: Cardiovascular;  Laterality: N/A;  prox LAD   CORONARY STENT INTERVENTION N/A 02/26/2024   Procedure: CORONARY STENT INTERVENTION;  Surgeon: Wenona Hamilton, MD;  Location: [facility] INVASIVE CV LAB;  Service: Cardiovascular;  Laterality: N/A;   HERNIA REPAIR     LEFT HEART CATH AND CORONARY ANGIOGRAPHY N/A 12/18/2023   Procedure: LEFT HEART CATH AND CORONARY ANGIOGRAPHY;  Surgeon: Wenona Hamilton, MD;  Location: [facility] INVASIVE CV LAB;  Service: Cardiovascular;  Laterality: N/A;   LEFT HEART CATH AND CORONARY ANGIOGRAPHY N/A 02/26/2024   Procedure: LEFT HEART CATH AND CORONARY ANGIOGRAPHY;  Surgeon: Wenona Hamilton, MD;  Location: [facility] INVASIVE CV LAB;  Service: Cardiovascular;  Laterality: N/A;   NASAL SINUS SURGERY     PILONIDAL CYST DRAINAGE     TONSILLECTOMY      Current Medications: Current Meds  Medication Sig   atorvastatin  (LIPITOR ) 80 MG tablet Take 1 tablet (80 mg total) by mouth daily.     Allergies:   Imdur  [isosorbide  nitrate], Zetia  [ezetimibe ], Penicillins, and Propoxyphene   Social History   Socioeconomic History   Marital status: Married    Spouse name: Not on file   Number of children: Not on file   Years of education: Not on file   Highest education level: Not on file  Occupational History   Not on file  Tobacco Use   Smoking status: Never   Smokeless tobacco: Never  Vaping Use   Vaping status: Never Used  Substance and Sexual Activity   Alcohol use: Not on file   Drug use: Not on file   Sexual activity: Not on file  Other Topics Concern   Not on file  Social History Narrative   Not on file   Social Drivers of Health   Financial Resource Strain: Low Risk  (12/13/2023)   Received from [facility]   Overall Financial Resource Strain (CARDIA)    Difficulty of Paying Living Expenses: Not hard at all  Food Insecurity: No Food Insecurity (12/13/2023)   Received from [facility]  Hunger Vital Sign    Worried About Running Out of Food in the Last Year: Never true    Ran Out of Food in the Last Year: Never true  Transportation Needs: No Transportation Needs (12/13/2023)   Received from [facility] - Transportation    In the past 12 months, has lack of transportation kept you from medical appointments or from getting medications?: No    Lack of Transportation (Non-Medical): No  Physical Activity: Not on file  Stress: Not on file  Social Connections: Not on file     Family History:  The patient's family history is not on file.  ROS:   12-point review of systems is negative unless otherwise noted in the HPI.   EKGs/Labs/Other Studies Reviewed:    Studies reviewed were summarized above. The additional studies were reviewed today:  2D echo 08/12/2023: 1. Left ventricular ejection fraction, by estimation, is 60 to 65%. The  left ventricle has normal function. The left ventricle has no  regional  wall motion abnormalities. Left ventricular diastolic parameters were  normal. The average left ventricular  global longitudinal strain is -16.0 %. The global longitudinal strain is  normal.   2. Right ventricular systolic function is normal. The right ventricular  size is normal.   3. The mitral valve is normal in structure. No evidence of mitral valve  regurgitation.   4. The aortic valve is tricuspid. Aortic valve regurgitation is not  visualized.   5. Aortic dilatation noted. There is mild dilatation of the ascending  aorta, measuring 39 mm.   6. The inferior vena cava is normal in size with greater than 50%  respiratory variability, suggesting right atrial pressure of 3 mmHg.  __________   Coronary CTA 12/12/2023: FINDINGS: Aorta:  Normal size.  No calcifications.  No dissection.   Aortic Valve:  Trileaflet.  No calcifications.   Coronary Arteries:  Normal coronary origin.  Right dominance.   RCA is a dominant artery. There is calcified plaque in the distal RCA causing mild stenosis (25-49%).   Left main gives rise to LAD and LCX arteries. LM has no disease.   LAD has non calcified plaque proximally causing severe stenosis (>70%).   LCX is a non-dominant artery. There is calcified plaque in the proximal LCx causing minimal stenosis (<25%).   Other findings:   Normal pulmonary vein drainage into the left atrium.   Normal left atrial appendage without a thrombus.   Normal size of the pulmonary artery.   IMPRESSION: 1. Coronary calcium  score of 267. This was 85th percentile for age and sex matched control.   2. Normal coronary origin with right dominance.   3. Severe proximal LAD stenosis (>70%).   4. Minimal proximal LCx stenosis (<25%).   5. CAD-RADS 4 Severe stenosis. (70-99% or > 50% left main). Cardiac catheterization is recommended. Consider symptom-guided anti-ischemic pharmacotherapy as well as risk factor modification per guideline directed  care.   6. Additional analysis with CT FFR will be submitted and reported separately.     CT FFR: 1. Left Main:  No significant stenosis.   2. LAD: significant stenosis in proximal LAD.  FFRct 0.65 3. LCX: No significant stenosis.  FFRct 0.80 4. RCA: No significant stenosis.  FFRct 0.90   IMPRESSION: 1. CT FFR analysis showed significant stenosis in the proximal LAD. FFRct 0.65.   2.  Cardiac catheterization recommended. __________   [facility] 12/18/2023:   Ramus-2 lesion is 95% stenosed.   Ramus-1  lesion is 80% stenosed.   Prox RCA lesion is 20% stenosed.   RPAV lesion is 30% stenosed.   Prox LAD to Mid LAD lesion is 95% stenosed.   Dist LAD lesion is 40% stenosed.   1st Sept lesion is 90% stenosed.   A drug-eluting stent was successfully placed using a STENT SYS SYNERGY XD 3.5X20.   Post intervention, there is a 0% residual stenosis.   The left ventricular systolic function is normal.   LV end diastolic pressure is mildly elevated.   The left ventricular ejection fraction is 55-65% by visual estimate.   1.  Severe two-vessel coronary artery disease involving the proximal/mid LAD and the superior branch of the ramus artery. 2.  Normal LV systolic function and mildly elevated left ventricular end-diastolic pressure. 3.  Successful angioplasty and drug-eluting stent placement to the LAD using a 3.5 mm stent which was postdilated to 4.2 millimeter distally and 4.5 mm proximally   Recommendations: Dual antiplatelet therapy for at least 6 months. Aggressive treatment of risk factors. Will treat the ramus branch medically for now given diameter of 2.5 and being diseased in 2 different areas.  However, if the patient has residual angina, this can be treated with PCI. __________   Zio patch 01/2024: Patient had a min HR of 46 bpm, max HR of 120 bpm, and avg HR of 66 bpm. Predominant underlying rhythm was Sinus Rhythm. Isolated SVEs were rare (<1.0%), SVE Couplets were rare (<1.0%), and  SVE Triplets were rare (<1.0%). Isolated VEs were frequent  (6.4%, 80371), VE Couplets were rare (<1.0%, 2899), and no VE Triplets were present. Ventricular Bigeminy and Trigeminy were present.  __________  [facility] 02/26/2024:   Dist LAD lesion is 40% stenosed.   Prox RCA lesion is 20% stenosed.   1st Sept lesion is 90% stenosed.   RPAV lesion is 30% stenosed.   RPDA lesion is 30% stenosed.   Ramus-2 lesion is 99% stenosed.   Ramus-1 lesion is 95% stenosed.   Ramus-3 lesion is 60% stenosed.   Non-stenotic Prox LAD to Mid LAD lesion was previously treated.   A drug-eluting stent was successfully placed using a STENT SYNERGY XD 2.25X28.   A drug-eluting stent was successfully placed using a STENT SYNERGY XD 2.50X20.   Post intervention, there is a 0% residual stenosis.   Post intervention, there is a 0% residual stenosis.   The left ventricular systolic function is normal.   LV end diastolic pressure is mildly elevated.   The left ventricular ejection fraction is 55-65% by visual estimate.   In the absence of any other complications or medical issues, we expect the patient to be ready for discharge from an interventional cardiology perspective on 02/26/2024.   Recommend dual antiplatelet therapy with Aspirin  81mg  daily and Clopidogrel  75mg  daily.   1.  Widely patent LAD stent with no significant restenosis.  Worsening disease in the superior branch of the ramus artery. 2.  Normal LV systolic function and mildly elevated left ventricular end-diastolic pressure. 3.  Successful angioplasty and 2 overlapped drug-eluting stent placement to the superior branch of the ramus artery.   Recommendations: Lifelong dual antiplatelet therapy if tolerated. The patient has aggressive disease.  Recommend a target LDL of less than 55 with a low threshold for using a PCSK9 inhibitor. Continue management of PVCs.   EKG:  EKG is ordered today.  The EKG ordered today demonstrates sinus bradycardia, 54 bpm, no  acute ST-T changes  Recent Labs: 11/21/2023: ALT 30 02/21/2024:  BUN 20; Creatinine, Ser 1.05; Hemoglobin 14.9; Platelets 203; Potassium 4.8; Sodium 143  Recent Lipid Panel    Component Value Date/Time   CHOL 153 11/21/2023 0820   TRIG 107 11/21/2023 0820   HDL 48 11/21/2023 0820   CHOLHDL 3.2 11/21/2023 0820   CHOLHDL 2.4 08/07/2019 0921   VLDL 17 08/07/2019 0921   LDLCALC 85 11/21/2023 0820    PHYSICAL EXAM:    VS:  BP 113/73   Pulse (!) 54   Ht 6' (1.829 m)   Wt 204 lb 3.2 oz (92.6 kg)   SpO2 98%   BMI 27.69 kg/m   BMI: Body mass index is 27.69 kg/m.  Physical Exam Vitals reviewed.  Constitutional:      Appearance: He is well-developed.  HENT:     Head: Normocephalic and atraumatic.  Eyes:     General:        Right eye: No discharge.        Left eye: No discharge.  Cardiovascular:     Rate and Rhythm: Regular rhythm. Bradycardia present.     Heart sounds: Normal heart sounds, S1 normal and S2 normal. Heart sounds not distant. No midsystolic click and no opening snap. No murmur heard.    No friction rub.  Pulmonary:     Effort: Pulmonary effort is normal. No respiratory distress.     Breath sounds: Normal breath sounds. No decreased breath sounds, wheezing, rhonchi or rales.  Chest:     Chest wall: No tenderness.  Musculoskeletal:     Cervical back: Normal range of motion.  Skin:    General: Skin is warm and dry.     Nails: There is no clubbing.  Neurological:     Mental Status: He is alert and oriented to person, place, and time.  Psychiatric:        Speech: Speech normal.        Behavior: Behavior normal.        Thought Content: Thought content normal.        Judgment: Judgment normal.     Wt Readings from Last 3 Encounters:  03/16/24 204 lb 3.2 oz (92.6 kg)  02/26/24 201 lb (91.2 kg)  02/21/24 205 lb 9.6 oz (93.3 kg)     ASSESSMENT & PLAN:   CAD involving the native coronary arteries with stable angina: He is doing well and without symptoms  concerning for angina or cardiac decompensation following most recent PCI.  Continue aggressive risk factor modification and secondary prevention including DAPT with aspirin  81 mg and clopidogrel  75 mg indefinitely, as long as tolerated.  He will otherwise continue atorvastatin  80 mg and Toprol -XL 25 mg.  Anticipate escalation of lipid-lowering therapy as outlined below.  Escalate activity as tolerated moving forward.  No indication for further ischemic testing.  Frequent PVCs: Much improved following titration of Toprol -XL to 25 mg daily which will be continued.  Dilated ascending thoracic aorta: Mildly dilated by echo in 08/2023 measuring 39 mm.  He is scheduled for follow-up echo in 08/2024.  HTN: Blood pressure well-controlled in the office today.  Continue Toprol -XL as outlined above.  HLD with statin intolerance: LDL 85 in 11/2023 with target LDL < 55.  Remains on atorvastatin  80 mg.  Intolerant to ezetimibe  secondary to leg cramps.  Check lipid panel, direct LDL, and LFT.  Anticipate escalation of lipid-lowering therapy with addition of PCSK9 inhibitor or bempedoic acid, if not cost prohibitive.     Disposition: F/u with Dr. Alvenia Aus or an APP  in 6 months.   Medication Adjustments/Labs and Tests Ordered: Current medicines are reviewed at length with the patient today.  Concerns regarding medicines are outlined above. Medication changes, Labs and Tests ordered today are summarized above and listed in the Patient Instructions accessible in Encounters.   Signed, [name], PA-C 03/16/2024 12:40 PM     [facility] [address] [place], [place] [address] [phone]

## 2024-03-16 ENCOUNTER — Encounter: Payer: Self-pay | Admitting: Physician Assistant

## 2024-03-16 ENCOUNTER — Ambulatory Visit: Payer: Self-pay | Attending: Physician Assistant | Admitting: Physician Assistant

## 2024-03-16 ENCOUNTER — Encounter: Payer: Self-pay | Admitting: *Deleted

## 2024-03-16 VITALS — BP 113/73 | HR 54 | Ht 72.0 in | Wt 204.2 lb

## 2024-03-16 DIAGNOSIS — I77810 Thoracic aortic ectasia: Secondary | ICD-10-CM

## 2024-03-16 DIAGNOSIS — Z79899 Other long term (current) drug therapy: Secondary | ICD-10-CM

## 2024-03-16 DIAGNOSIS — I10 Essential (primary) hypertension: Secondary | ICD-10-CM

## 2024-03-16 DIAGNOSIS — Z789 Other specified health status: Secondary | ICD-10-CM

## 2024-03-16 DIAGNOSIS — I25118 Atherosclerotic heart disease of native coronary artery with other forms of angina pectoris: Secondary | ICD-10-CM

## 2024-03-16 DIAGNOSIS — I493 Ventricular premature depolarization: Secondary | ICD-10-CM

## 2024-03-16 DIAGNOSIS — Z955 Presence of coronary angioplasty implant and graft: Secondary | ICD-10-CM

## 2024-03-16 DIAGNOSIS — E785 Hyperlipidemia, unspecified: Secondary | ICD-10-CM

## 2024-03-16 MED ORDER — METOPROLOL SUCCINATE ER 25 MG PO TB24: 25.0000 mg | ORAL_TABLET | Freq: Every evening | ORAL | 3 refills | Status: AC

## 2024-03-16 NOTE — Progress Notes (Signed)
 Cardiac Individual Treatment Plan  Patient Details  Name: Chase Jensen MRN: [id number] Date of Birth: [birth year] Referring Provider:   Flowsheet Row Cardiac Rehab from 01/21/2024 in [facility] Cardiac and Pulmonary Rehab  Referring Provider Dr. Nestor Banter       Initial Encounter Date:  Flowsheet Row Cardiac Rehab from 01/21/2024 in [facility] Cardiac and Pulmonary Rehab  Date 01/21/24       Visit Diagnosis: Status post coronary artery stent placement  Patient's Home Medications on Admission:  Current Outpatient Medications:    Ascorbic Acid (VITAMIN C PO), Take 1,000 mg by mouth daily., Disp: , Rfl:    aspirin  81 MG EC tablet, Take 81 mg by mouth daily. Swallow whole., Disp: , Rfl:    atorvastatin  (LIPITOR ) 80 MG tablet, Take 1 tablet (80 mg total) by mouth daily., Disp: 90 tablet, Rfl: 3   clopidogrel  (PLAVIX ) 75 MG tablet, Take 1 tablet (75 mg total) by mouth daily., Disp: 90 tablet, Rfl: 2   fluticasone  (FLONASE ) 50 MCG/ACT nasal spray, Place 1 spray into the nose daily as needed for allergies., Disp: , Rfl:    levothyroxine  (SYNTHROID ) 150 MCG tablet, Take 150 mcg by mouth daily before breakfast., Disp: , Rfl:    metoprolol  succinate (TOPROL  XL) 25 MG 24 hr tablet, Take 1 tablet (25 mg total) by mouth at bedtime., Disp: 90 tablet, Rfl: 3   nitroGLYCERIN  (NITROSTAT ) 0.4 MG SL tablet, Place 1 tablet (0.4 mg total) under the tongue every 5 (five) minutes as needed for chest pain., Disp: 25 tablet, Rfl: 2   OVER THE COUNTER MEDICATION, Take 1 packet by mouth daily. Multivitamin Packet - Multivitamin, Fish Oil, Omega Blend, Turmeric, Coq10, Vit K2, Boron, Disp: , Rfl:    SUMAtriptan  (IMITREX ) 100 MG tablet, Take 100 mg by mouth every 2 (two) hours as needed for migraine., Disp: , Rfl:   Past Medical History: Past Medical History:  Diagnosis Date   Coronary artery disease 2006    balloon angioplasty without stent placement to one-vessel done at [facility] in [place]      Hyperlipemia    Hypertension    Hypothyroidism     Tobacco Use: Social History   Tobacco Use  Smoking Status Never  Smokeless Tobacco Never    Labs: Review Flowsheet       Latest Ref Rng & Units 08/07/2019 11/21/2023  Labs for ITP Cardiac and Pulmonary Rehab  Cholestrol 100 - 199 mg/dL 119  147   LDL (calc) 0 - 99 mg/dL 51  85   HDL-C >82 mg/dL 47  48   Trlycerides 0 - 149 mg/dL 87  956      Exercise Target Goals: Exercise Program Goal: Individual exercise prescription set using results from initial 6 min walk test and THRR while considering  patient's activity barriers and safety.   Exercise Prescription Goal: Initial exercise prescription builds to 30-45 minutes a day of aerobic activity, 2-3 days per week.  Home exercise guidelines will be given to patient during program as part of exercise prescription that the participant will acknowledge.   Education: Aerobic Exercise: - Group verbal and visual presentation on the components of exercise prescription. Introduces F.I.T.T principle from ACSM for exercise prescriptions.  Reviews F.I.T.T. principles of aerobic exercise including progression. Written material given at graduation.   Education: Resistance Exercise: - Group verbal and visual presentation on the components of exercise prescription. Introduces F.I.T.T principle from ACSM for exercise prescriptions  Reviews F.I.T.T. principles of resistance exercise including progression. Written  material given at graduation.    Education: Exercise & Equipment Safety: - Individual verbal instruction and demonstration of equipment use and safety with use of the equipment. Flowsheet Row Cardiac Rehab from 02/19/2024 in [facility] Cardiac and Pulmonary Rehab  Date 01/21/24  Educator [facility]  Instruction Review Code 1- Verbalizes Understanding       Education: Exercise Physiology & General Exercise Guidelines: - Group verbal and written instruction with models to review the exercise  physiology of the cardiovascular system and associated critical values. Provides general exercise guidelines with specific guidelines to those with heart or lung disease.  Flowsheet Row Cardiac Rehab from 02/19/2024 in [facility] Cardiac and Pulmonary Rehab  Date 02/19/24  Educator [facility]  Instruction Review Code 1- [employer] Understanding       Education: Flexibility, Balance, Mind/Body Relaxation: - Group verbal and visual presentation with interactive activity on the components of exercise prescription. Introduces F.I.T.T principle from ACSM for exercise prescriptions. Reviews F.I.T.T. principles of flexibility and balance exercise training including progression. Also discusses the mind body connection.  Reviews various relaxation techniques to help reduce and manage stress (i.e. Deep breathing, progressive muscle relaxation, and visualization). Balance handout provided to take home. Written material given at graduation.   Activity Barriers & Risk Stratification:  Activity Barriers & Cardiac Risk Stratification - 01/02/24 1345       Activity Barriers & Cardiac Risk Stratification   Activity Barriers None    Cardiac Risk Stratification Moderate             6 Minute Walk:  6 Minute Walk     Row Name 01/21/24 1457         6 Minute Walk   Phase Initial     Distance 1550 feet     Walk Time 6 minutes     # of Rest Breaks 0     MPH 2.9     METS 3.8     RPE 9     Perceived Dyspnea  0     Symptoms No     Resting HR 71 bpm     Resting BP 116/62     Resting Oxygen Saturation  96 %     Exercise Oxygen Saturation  during 6 min walk 98 %     Max Ex. HR 88 bpm     Max Ex. BP 126/64     2 Minute Post BP 122/62              Oxygen Initial Assessment:   Oxygen Re-Evaluation:   Oxygen Discharge (Final Oxygen Re-Evaluation):   Initial Exercise Prescription:  Initial Exercise Prescription - 01/21/24 1400       Date of Initial Exercise RX and Referring Provider   Date  01/21/24    Referring Provider Dr. Nestor Banter      Oxygen   Maintain Oxygen Saturation 88% or higher      Treadmill   MPH 2.8    Grade 1    Minutes 15    METs 3.53      Elliptical   Level 1    Speed 3    Minutes 15    METs 3.76      REL-XR   Level 3    Watts 25    Speed 50    Minutes 15    METs 3.76      Rower   Level 1    Watts 25    Minutes 15    METs 3.76  Intensity   THRR 40-80% of Max Heartrate 100-130    Ratings of Perceived Exertion 11-13    Perceived Dyspnea 0-4      Resistance Training   Training Prescription Yes    Weight 8lb    Reps 10-15             Perform Capillary Blood Glucose checks as needed.  Exercise Prescription Changes:   Exercise Prescription Changes     Row Name 01/21/24 1500 02/11/24 1500 02/27/24 1800         Response to Exercise   Blood Pressure (Admit) 116/62 134/62 108/62     Blood Pressure (Exercise) 126/64 162/60 158/82     Blood Pressure (Exit) 122/62 104/60 106/70     Heart Rate (Admit) 71 bpm 81 bpm 102 bpm     Heart Rate (Exercise) 88 bpm 135 bpm 136 bpm     Heart Rate (Exit) 67 bpm 84 bpm 91 bpm     Oxygen Saturation (Admit) 96 % -- --     Oxygen Saturation (Exercise) 99 % -- --     Oxygen Saturation (Exit) 99 % -- --     Rating of Perceived Exertion (Exercise) 9 13 13      Perceived Dyspnea (Exercise) 0 -- --     Symptoms none none none     Comments results First two weeks of exercise' --     Duration -- Continue with 30 min of aerobic exercise without signs/symptoms of physical distress. Continue with 30 min of aerobic exercise without signs/symptoms of physical distress.     Intensity -- THRR unchanged THRR unchanged       Progression   Progression -- Continue to progress workloads to maintain intensity without signs/symptoms of physical distress. Continue to progress workloads to maintain intensity without signs/symptoms of physical distress.     Average METs -- 3.83 4.46       Resistance  Training   Training Prescription -- Yes Yes     Weight -- 8 lb 8 lb     Reps -- 10-15 10-15       Interval Training   Interval Training -- No No       Treadmill   MPH -- 3 3.5     Grade -- 3 3     Minutes -- 15 15     METs -- 4.54 5.13       Elliptical   Level -- 2 3     Speed -- 3.4 3.7     Minutes -- 15 15     METs -- 4.1 4.1       Rower   Level -- -- 10     Watts -- -- 85     Minutes -- -- 15       Oxygen   Maintain Oxygen Saturation -- 88% or higher 88% or higher              Exercise Comments:   Exercise Comments     Row Name 01/27/24 1355 02/20/24 1359         Exercise Comments First full day of exercise!  Patient was oriented to gym and equipment including functions, settings, policies, and procedures.  Patient's individual exercise prescription and treatment plan were reviewed.  All starting workloads were established based on the results of the 6 minute walk test done at initial orientation visit.  The plan for exercise progression was also introduced and progression will be customized based on patient's  performance and goals. --               Exercise Goals and Review:   Exercise Goals     Row Name 01/21/24 1502             Exercise Goals   Increase Physical Activity Yes       Intervention Provide advice, education, support and counseling about physical activity/exercise needs.;Develop an individualized exercise prescription for aerobic and resistive training based on initial evaluation findings, risk stratification, comorbidities and participant's personal goals.       Expected Outcomes Short Term: Attend rehab on a regular basis to increase amount of physical activity.;Long Term: Add in home exercise to make exercise part of routine and to increase amount of physical activity.;Long Term: Exercising regularly at least 3-5 days a week.       Increase Strength and Stamina Yes       Intervention Provide advice, education, support and counseling  about physical activity/exercise needs.;Develop an individualized exercise prescription for aerobic and resistive training based on initial evaluation findings, risk stratification, comorbidities and participant's personal goals.       Expected Outcomes Short Term: Increase workloads from initial exercise prescription for resistance, speed, and METs.;Short Term: Perform resistance training exercises routinely during rehab and add in resistance training at home;Long Term: Improve cardiorespiratory fitness, muscular endurance and strength as measured by increased METs and functional capacity ( )       Able to understand and use rate of perceived exertion (RPE) scale Yes       Intervention Provide education and explanation on how to use RPE scale       Expected Outcomes Short Term: Able to use RPE daily in rehab to express subjective intensity level;Long Term:  Able to use RPE to guide intensity level when exercising independently       Able to understand and use Dyspnea scale Yes       Intervention Provide education and explanation on how to use Dyspnea scale       Expected Outcomes Short Term: Able to use Dyspnea scale daily in rehab to express subjective sense of shortness of breath during exertion;Long Term: Able to use Dyspnea scale to guide intensity level when exercising independently       Knowledge and understanding of Target Heart Rate Range (THRR) Yes       Intervention Provide education and explanation of THRR including how the numbers were predicted and where they are located for reference       Expected Outcomes Short Term: Able to state/look up THRR;Short Term: Able to use daily as guideline for intensity in rehab;Long Term: Able to use THRR to govern intensity when exercising independently       Able to check pulse independently Yes       Intervention Provide education and demonstration on how to check pulse in carotid and radial arteries.;Review the importance of being able to check your  own pulse for safety during independent exercise       Expected Outcomes Short Term: Able to explain why pulse checking is important during independent exercise;Long Term: Able to check pulse independently and accurately       Understanding of Exercise Prescription Yes       Intervention Provide education, explanation, and written materials on patient's individual exercise prescription       Expected Outcomes Short Term: Able to explain program exercise prescription;Long Term: Able to explain home exercise prescription to exercise independently  Exercise Goals Re-Evaluation :  Exercise Goals Re-Evaluation     Row Name 01/27/24 1355 02/11/24 1521 02/27/24 1802 03/13/24 0805       Exercise Goal Re-Evaluation   Exercise Goals Review Able to understand and use rate of perceived exertion (RPE) scale;Increase Physical Activity;Understanding of Exercise Prescription;Increase Strength and Stamina;Able to check pulse independently;Knowledge and understanding of Target Heart Rate Range (THRR) Increase Physical Activity;Increase Strength and Stamina;Understanding of Exercise Prescription Increase Physical Activity;Increase Strength and Stamina;Understanding of Exercise Prescription Increase Physical Activity;Increase Strength and Stamina;Understanding of Exercise Prescription    Comments Reviewed RPE and dyspnea scale, THR and program prescription with pt today.  Pt voiced understanding and was given a copy of goals to take home. Anvith is off to a good start in the program. During his first two weeks in rehab he was able to increase his treadmill workload to a speed of 3 mph with an incline of 3%. He also improved to level 2 on the elliptical at a speed of 3.4 mph. We will continue to monitor his progress in the program. Justyce is doing well in rehab. He was able to increase his workload on the treadmill to a speed of 3.5 mph and incline of 3%. He also increased his workload on the elliptical  to level 3 with a speed of 3.32mph. He also increased to level 10 on the rower with an average watts of 85. He will be out on medical hold starting the week of 5/19. We will continue to monitor his progress in the program. Andranik has been on medical hold due to receiving another stent placement. He last attended rehab on 02/20/2024. We will continue to monitor his progress when he returns to the program.    Expected Outcomes Short: Use RPE daily to regulate intensity.  Long: Follow program prescription in THR. -- Short: Continue to follow current exercise prescription. Long: Continue exercise to improve strength and stamina. Short: Return to rehab when appropriate. Long: Continue exercise to improve strength and stamina.             Discharge Exercise Prescription (Final Exercise Prescription Changes):  Exercise Prescription Changes - 02/27/24 1800       Response to Exercise   Blood Pressure (Admit) 108/62    Blood Pressure (Exercise) 158/82    Blood Pressure (Exit) 106/70    Heart Rate (Admit) 102 bpm    Heart Rate (Exercise) 136 bpm    Heart Rate (Exit) 91 bpm    Rating of Perceived Exertion (Exercise) 13    Symptoms none    Duration Continue with 30 min of aerobic exercise without signs/symptoms of physical distress.    Intensity THRR unchanged      Progression   Progression Continue to progress workloads to maintain intensity without signs/symptoms of physical distress.    Average METs 4.46      Resistance Training   Training Prescription Yes    Weight 8 lb    Reps 10-15      Interval Training   Interval Training No      Treadmill   MPH 3.5    Grade 3    Minutes 15    METs 5.13      Elliptical   Level 3    Speed 3.7    Minutes 15    METs 4.1      Rower   Level 10    Watts 85    Minutes 15      Oxygen  Maintain Oxygen Saturation 88% or higher             Nutrition:  Target Goals: Understanding of nutrition guidelines, daily intake of sodium 1500mg ,  cholesterol 200mg , calories 30% from fat and 7% or less from saturated fats, daily to have 5 or more servings of fruits and vegetables.  Education: All About Nutrition: -Group instruction provided by verbal, written material, interactive activities, discussions, models, and posters to present general guidelines for heart healthy nutrition including fat, fiber, MyPlate, the role of sodium in heart healthy nutrition, utilization of the nutrition label, and utilization of this knowledge for meal planning. Follow up email sent as well. Written material given at graduation. Flowsheet Row Cardiac Rehab from 02/19/2024 in [facility] Cardiac and Pulmonary Rehab  Education need identified 01/21/24       Biometrics:  Pre Biometrics - 01/21/24 1503       Pre Biometrics   Height 6' (1.829 m)    Weight 205 lb 1.6 oz (93 kg)    Waist Circumference 38.5 inches    Hip Circumference 39.5 inches    Waist to Hip Ratio 0.97 %    BMI (Calculated) 27.81    Single Leg Stand 30 seconds              Nutrition Therapy Plan and Nutrition Goals:   Nutrition Assessments:  MEDIFICTS Score Key: >=70 Need to make dietary changes  40-70 Heart Healthy Diet <= 40 Therapeutic Level Cholesterol Diet  Flowsheet Row Cardiac Rehab from 01/21/2024 in [facility] Cardiac and Pulmonary Rehab  Picture Your Plate Total Score on Admission 70      Picture Your Plate Scores: <16 Unhealthy dietary pattern with much room for improvement. 41-50 Dietary pattern unlikely to meet recommendations for good health and room for improvement. 51-60 More healthful dietary pattern, with some room for improvement.  >60 Healthy dietary pattern, although there may be some specific behaviors that could be improved.    Nutrition Goals Re-Evaluation:  Nutrition Goals Re-Evaluation     Row Name 02/19/24 1355             Goals   Nutrition Goal Montford has scheduled an appointment to meet with the RD in the coming weeks. He does not have  any specific questions, but would like to learn more about what a heart healthy diet should look like.       Comment Short: Meet with RD. Long: Implement heart healthy eating patterns discussed with RD.                Nutrition Goals Discharge (Final Nutrition Goals Re-Evaluation):  Nutrition Goals Re-Evaluation - 02/19/24 1355       Goals   Nutrition Goal Braxtyn has scheduled an appointment to meet with the RD in the coming weeks. He does not have any specific questions, but would like to learn more about what a heart healthy diet should look like.    Comment Short: Meet with RD. Long: Implement heart healthy eating patterns discussed with RD.             Psychosocial: Target Goals: Acknowledge presence or absence of significant depression and/or stress, maximize coping skills, provide positive support system. Participant is able to verbalize types and ability to use techniques and skills needed for reducing stress and depression.   Education: Stress, Anxiety, and Depression - Group verbal and visual presentation to define topics covered.  Reviews how body is impacted by stress, anxiety, and depression.  Also discusses healthy  ways to reduce stress and to treat/manage anxiety and depression.  Written material given at graduation.   Education: Sleep Hygiene -Provides group verbal and written instruction about how sleep can affect your health.  Define sleep hygiene, discuss sleep cycles and impact of sleep habits. Review good sleep hygiene tips.    Initial Review & Psychosocial Screening:  Initial Psych Review & Screening - 01/02/24 1345       Initial Review   Current issues with None Identified      Family Dynamics   Good Support System? Yes   wife,   daughter 3 hours away, 2 sons out of state.     Barriers   Psychosocial barriers to participate in program There are no identifiable barriers or psychosocial needs.      Screening Interventions   Interventions To provide  support and resources with identified psychosocial needs;Provide feedback about the scores to participant    Expected Outcomes Short Term goal: Utilizing psychosocial counselor, staff and physician to assist with identification of specific Stressors or current issues interfering with healing process. Setting desired goal for each stressor or current issue identified.;Long Term Goal: Stressors or current issues are controlled or eliminated.;Short Term goal: Identification and review with participant of any Quality of Life or Depression concerns found by scoring the questionnaire.;Long Term goal: The participant improves quality of Life and PHQ9 Scores as seen by post scores and/or verbalization of changes             Quality of Life Scores:   Quality of Life - 01/21/24 1503       Quality of Life   Select Quality of Life      Quality of Life Scores   Health/Function Pre 24.3 %    Socioeconomic Pre 22.88 %    Psych/Spiritual Pre 24.64 %    Family Pre 25.2 %    GLOBAL Pre 24.17 %            Scores of 19 and below usually indicate a poorer quality of life in these areas.  A difference of  2-3 points is a clinically meaningful difference.  A difference of 2-3 points in the total score of the Quality of Life Index has been associated with significant improvement in overall quality of life, self-image, physical symptoms, and general health in studies assessing change in quality of life.  PHQ-9: Review Flowsheet       01/21/2024  Depression screen PHQ 2/9  Decreased Interest 0  Down, Depressed, Hopeless 0  PHQ - 2 Score 0  Altered sleeping 0  Tired, decreased energy 1  Change in appetite 0  Feeling bad or failure about yourself  0  Trouble concentrating 0  Moving slowly or fidgety/restless 0  Suicidal thoughts 0  PHQ-9 Score 1  Difficult doing work/chores Not difficult at all   Interpretation of Total Score  Total Score Depression Severity:  1-4 = Minimal depression, 5-9 =  Mild depression, 10-14 = Moderate depression, 15-19 = Moderately severe depression, 20-27 = Severe depression   Psychosocial Evaluation and Intervention:  Psychosocial Evaluation - 01/02/24 1357       Psychosocial Evaluation & Interventions   Interventions Encouraged to exercise with the program and follow exercise prescription    Comments There are no barriers to attending the program.  He is ready to  work improving his lifestyle to be able tomanage his heart disease.  He has been on a weight loss journey since last Noverber,; loosing almost 30 lbs  to today.  HAs a goal of a few more pounds to lose. He lives with his wife and she and their family are his support.   a daughter lives 3 hours away and 2 sons live [place]. He had to delay his start date because his wife's father was disgnosed with pancreatic cancer and they are going to [place]  to see him and her mother (who has dementia). He is ready to start the program once he returns home.    Expected Outcomes STG attend all scheduled sessions, continue weight loss to goal, follow exercise guidelines working on exercise progression.   LTG able to continue on weight loss/maintainence and exercise progression after discharge    Continue Psychosocial Services  Follow up required by staff             Psychosocial Re-Evaluation:  Psychosocial Re-Evaluation     Row Name 02/19/24 1401             Psychosocial Re-Evaluation   Current issues with None Identified       Comments Dillion reports no major stressors at this time. He states that his wife is a good support for him, as well as church and family. He enjoys exercising, outdoor activities, and being around other people for stress relief. Gurkaran states that he is sleeping well at this time, and gets between 6-7 hours a night.       Expected Outcomes Short: Continue to exercise for stress relief. Long: Continue to relieve stress through healthy avenues.       Interventions Encouraged to  attend Cardiac Rehabilitation for the exercise       Continue Psychosocial Services  Follow up required by staff                Psychosocial Discharge (Final Psychosocial Re-Evaluation):  Psychosocial Re-Evaluation - 02/19/24 1401       Psychosocial Re-Evaluation   Current issues with None Identified    Comments Jeoffrey reports no major stressors at this time. He states that his wife is a good support for him, as well as church and family. He enjoys exercising, outdoor activities, and being around other people for stress relief. Quavion states that he is sleeping well at this time, and gets between 6-7 hours a night.    Expected Outcomes Short: Continue to exercise for stress relief. Long: Continue to relieve stress through healthy avenues.    Interventions Encouraged to attend Cardiac Rehabilitation for the exercise    Continue Psychosocial Services  Follow up required by staff             Vocational Rehabilitation: Provide vocational rehab assistance to qualifying candidates.   Vocational Rehab Evaluation & Intervention:   Education: Education Goals: Education classes will be provided on a variety of topics geared toward better understanding of heart health and risk factor modification. Participant will state understanding/return demonstration of topics presented as noted by education test scores.  Learning Barriers/Preferences:   General Cardiac Education Topics:  AED/CPR: - Group verbal and written instruction with the use of models to demonstrate the basic use of the AED with the basic ABC's of resuscitation.   Anatomy and Cardiac Procedures: - Group verbal and visual presentation and models provide information about basic cardiac anatomy and function. Reviews the testing methods done to diagnose heart disease and the outcomes of the test results. Describes the treatment choices: Medical Management, Angioplasty, or Coronary Bypass Surgery for treating various heart  conditions including Myocardial Infarction, Angina, Valve  Disease, and Cardiac Arrhythmias.  Written material given at graduation.   Medication Safety: - Group verbal and visual instruction to review commonly prescribed medications for heart and lung disease. Reviews the medication, class of the drug, and side effects. Includes the steps to properly store meds and maintain the prescription regimen.  Written material given at graduation.   Intimacy: - Group verbal instruction through game format to discuss how heart and lung disease can affect sexual intimacy. Written material given at graduation..   Know Your Numbers and Heart Failure: - Group verbal and visual instruction to discuss disease risk factors for cardiac and pulmonary disease and treatment options.  Reviews associated critical values for Overweight/Obesity, Hypertension, Cholesterol, and Diabetes.  Discusses basics of heart failure: signs/symptoms and treatments.  Introduces Heart Failure Zone chart for action plan for heart failure.  Written material given at graduation. Flowsheet Row Cardiac Rehab from 02/19/2024 in [facility] Cardiac and Pulmonary Rehab  Education need identified 01/21/24  Date 01/29/24  Educator SB  Instruction Review Code 1- Verbalizes Understanding       Infection Prevention: - Provides verbal and written material to individual with discussion of infection control including proper hand washing and proper equipment cleaning during exercise session. Flowsheet Row Cardiac Rehab from 02/19/2024 in [facility] Cardiac and Pulmonary Rehab  Date 01/21/24  Educator [facility]  Instruction Review Code 1- Verbalizes Understanding       Falls Prevention: - Provides verbal and written material to individual with discussion of falls prevention and safety. Flowsheet Row Cardiac Rehab from 02/19/2024 in [facility] Cardiac and Pulmonary Rehab  Date 01/21/24  Educator [facility]  Instruction Review Code 1- Verbalizes Understanding        Other: -Provides group and verbal instruction on various topics (see comments)   Knowledge Questionnaire Score:  Knowledge Questionnaire Score - 01/21/24 1505       Knowledge Questionnaire Score   Pre Score 23/26             Core Components/Risk Factors/Patient Goals at Admission:  Personal Goals and Risk Factors at Admission - 01/21/24 1505       Core Components/Risk Factors/Patient Goals on Admission    Weight Management Yes    Intervention Weight Management: Develop a combined nutrition and exercise program designed to reach desired caloric intake, while maintaining appropriate intake of nutrient and fiber, sodium and fats, and appropriate energy expenditure required for the weight goal.;Weight Management: Provide education and appropriate resources to help participant work on and attain dietary goals.    Admit Weight 205 lb 1.6 oz (93 kg)    Goal Weight: Short Term 200 lb (90.7 kg)    Goal Weight: Long Term 199 lb (90.3 kg)    Expected Outcomes Short Term: Continue to assess and modify interventions until short term weight is achieved;Long Term: Adherence to nutrition and physical activity/exercise program aimed toward attainment of established weight goal;Weight Loss: Understanding of general recommendations for a balanced deficit meal plan, which promotes 1-2 lb weight loss per week and includes a negative energy balance of [phone] kcal/d    Hypertension Yes    Intervention Provide education on lifestyle modifcations including regular physical activity/exercise, weight management, moderate sodium restriction and increased consumption of fresh fruit, vegetables, and low fat dairy, alcohol moderation, and smoking cessation.;Monitor prescription use compliance.    Expected Outcomes Short Term: Continued assessment and intervention until BP is < 140/84mm HG in hypertensive participants. < 130/17mm HG in hypertensive participants with diabetes, heart failure or chronic kidney  disease.;Long Term: Maintenance of blood pressure at goal levels.    Lipids Yes    Intervention Provide education and support for participant on nutrition & aerobic/resistive exercise along with prescribed medications to achieve LDL 70mg , HDL >40mg .    Expected Outcomes Short Term: Participant states understanding of desired cholesterol values and is compliant with medications prescribed. Participant is following exercise prescription and nutrition guidelines.;Long Term: Cholesterol controlled with medications as prescribed, with individualized exercise RX and with personalized nutrition plan. Value goals: LDL < 70mg , HDL > 40 mg.             Education:Diabetes - Individual verbal and written instruction to review signs/symptoms of diabetes, desired ranges of glucose level fasting, after meals and with exercise. Acknowledge that pre and post exercise glucose checks will be done for 3 sessions at entry of program.   Core Components/Risk Factors/Patient Goals Review:   Goals and Risk Factor Review     Row Name 02/19/24 1357             Core Components/Risk Factors/Patient Goals Review   Personal Goals Review Hypertension;Weight Management/Obesity       Review Trigger states that he checks his BP regularly at home. He reports that his BP readings have stayed within normal ranges and he records them on an app to keep track. Kinneth states that he is comfortable with where his weight is at this time. He has lost 30 lbs since November of 2024 and would like to maintain his weight around 200 lb.       Expected Outcomes Short: Continue to monitor BP independently. Long: Continue to manage lifestyle risk factors.                Core Components/Risk Factors/Patient Goals at Discharge (Final Review):   Goals and Risk Factor Review - 02/19/24 1357       Core Components/Risk Factors/Patient Goals Review   Personal Goals Review Hypertension;Weight Management/Obesity    Review Jalan states  that he checks his BP regularly at home. He reports that his BP readings have stayed within normal ranges and he records them on an app to keep track. Zollie states that he is comfortable with where his weight is at this time. He has lost 30 lbs since November of 2024 and would like to maintain his weight around 200 lb.    Expected Outcomes Short: Continue to monitor BP independently. Long: Continue to manage lifestyle risk factors.             ITP Comments:  ITP Comments     Row Name 01/02/24 1426 01/21/24 1456 01/27/24 1355 02/12/24 1312 02/20/24 1359   ITP Comments Virtual orientation call completed today. he has an appointment on Date: 01/21/2024  for EP eval and gym Orientation.  Documentation of diagnosis [facility] found in [facility] Date: 12/18/2023 . Completed and gym orientation for cardiac rehab. Initial ITP created and sent for review to Dr. Firman Hughes, Medical Director. First full day of exercise!  Patient was oriented to gym and equipment including functions, settings, policies, and procedures.  Patient's individual exercise prescription and treatment plan were reviewed.  All starting workloads were established based on the results of the 6 minute walk test done at initial orientation visit.  The plan for exercise progression was also introduced and progression will be customized based on patient's performance and goals. 30 Day review completed. Medical Director ITP review done, changes made as directed, and signed approval by Medical Director. New  to program. --    Row Name 03/11/24 1108 03/16/24 1424         ITP Comments 30 Day review completed. Medical Director ITP review done, changes made as directed, and signed approval by Medical Director. Lonnel called staff to state that he wishes to be discharged at this time after receiving a good report from his cardiology appointment and feels he no longer needs the program. He completed 12 of 36 sessions.               Comments: Early  Discharge ITP

## 2024-03-16 NOTE — Progress Notes (Signed)
 Discharge Summary   Chase Jensen DOB: [birth year]   [name] called staff to state that he wishes to be discharged at this time after receiving a good report from his cardiology appointment and feels he no longer needs the program. He completed 12 of 36 sessions.    6 Minute Walk     Row Name 01/21/24 1457         6 Minute Walk   Phase Initial     Distance 1550 feet     Walk Time 6 minutes     # of Rest Breaks 0     MPH 2.9     METS 3.8     RPE 9     Perceived Dyspnea  0     Symptoms No     Resting HR 71 bpm     Resting BP 116/62     Resting Oxygen Saturation  96 %     Exercise Oxygen Saturation  during 6 min walk 98 %     Max Ex. HR 88 bpm     Max Ex. BP 126/64     2 Minute Post BP 122/62

## 2024-03-16 NOTE — Patient Instructions (Signed)
 Medication Instructions:  Your physician recommends the following medication changes.  INCREASE: Toprol -XL 25 mg once per day  *If you need a refill on your cardiac medications before your next appointment, please call your pharmacy*  Lab Work: Your provider would like for you to have following labs drawn today Lipid panel, Direct LDL, and Liver Function test.   If you have labs (blood work) drawn today and your tests are completely normal, you will receive your results only by: MyChart Message (if you have MyChart) OR A paper copy in the mail If you have any lab test that is abnormal or we need to change your treatment, we will call you to review the results.  Follow-Up: At [facility], you and your health needs are our priority.  As part of our continuing mission to provide you with exceptional heart care, our providers are all part of one team.  This team includes your primary Cardiologist (physician) and Advanced Practice Providers or APPs (Physician Assistants and Nurse Practitioners) who all work together to provide you with the care you need, when you need it.  Your next appointment:   6 month(s)  Provider:   You may see Antionette Kirks, MD or [name], PA-C

## 2024-03-17 ENCOUNTER — Ambulatory Visit: Payer: Self-pay | Admitting: Physician Assistant

## 2024-03-17 LAB — HEPATIC FUNCTION PANEL
ALT: 24 IU/L (ref 0–44)
AST: 25 IU/L (ref 0–40)
Albumin: 4.3 g/dL (ref 3.8–4.9)
Alkaline Phosphatase: 75 IU/L (ref 44–121)
Bilirubin Total: 0.4 mg/dL (ref 0.0–1.2)
Bilirubin, Direct: 0.17 mg/dL (ref 0.00–0.40)
Total Protein: 6.8 g/dL (ref 6.0–8.5)

## 2024-03-17 LAB — LIPID PANEL
Chol/HDL Ratio: 3 ratio (ref 0.0–5.0)
Cholesterol, Total: 143 mg/dL (ref 100–199)
HDL: 48 mg/dL (ref >39)
LDL Chol Calc (NIH): 77 mg/dL (ref 0–99)
Triglycerides: 95 mg/dL (ref 0–149)
VLDL Cholesterol Cal: 18 mg/dL (ref 5–40)

## 2024-03-17 LAB — LDL CHOLESTEROL, DIRECT: LDL Direct: 71 mg/dL (ref 0–99)

## 2024-03-20 ENCOUNTER — Telehealth: Payer: Self-pay | Admitting: Pharmacy Technician

## 2024-03-20 ENCOUNTER — Other Ambulatory Visit (HOSPITAL_COMMUNITY): Payer: Self-pay

## 2024-03-20 DIAGNOSIS — Z955 Presence of coronary angioplasty implant and graft: Secondary | ICD-10-CM

## 2024-03-20 DIAGNOSIS — I25118 Atherosclerotic heart disease of native coronary artery with other forms of angina pectoris: Secondary | ICD-10-CM

## 2024-03-20 NOTE — Telephone Encounter (Signed)
 Pharmacy Patient Advocate Encounter  Insurance verification completed.   The patient is insured through no insurance   Ran test claim for Repatha. Currently a quantity of 2 ml is a 28 day supply and the co-pay is $731.61 .  Ran test claim for Praluent 75. Currently a quantity of 2 ml is a 28 day supply and the co-pay is $667.18.  Ran test claim for Nexletol. Currently a quantity of 30 tabs is a 30 day supply and the co-pay is $539.31.  This test claim was processed through [facility]- copay amounts may vary at other pharmacies due to pharmacy/plan contracts, or as the patient moves through the different stages of their insurance plan.

## 2024-03-25 ENCOUNTER — Telehealth: Payer: Self-pay

## 2024-03-25 NOTE — Telephone Encounter (Signed)
 H&V Care Navigation CSW Progress Note  Clinical Social Worker completed chart review regarding self pay status - per recent hospital notes pt is over income for Medicaid, will call and discuss [employer] application and assistance for generics. Recommend Pharm Assistance team send any PAP that pt may be eligible for, for these medications.  Patient is participating in a Managed Medicaid Plan:  No, self pay only  SDOH Screenings   Food Insecurity: No Food Insecurity (12/13/2023)   Received from [facility]  Housing: Low Risk  (12/13/2023)   Received from [facility]  Transportation Needs: No Transportation Needs (12/13/2023)   Received from [facility]  Utilities: Not At Risk (12/13/2023)   Received from [facility]  Depression [phone]): Low Risk  (01/21/2024)  Financial Resource Strain: Low Risk  (12/13/2023)   Received from [facility]  Tobacco Use: Low Risk  (03/16/2024)    Chase Jensen, MSW, LCSW Clinical Social Worker II [facility] Heart/Vascular Care Navigation  [phone]- work cell phone (preferred)

## 2024-03-25 NOTE — Telephone Encounter (Signed)
 PAP: Patient assistance application for REPATHA through [employer] has been mailed to pt's home address on file. Provider portion of application will be faxed to provider's office.

## 2024-03-25 NOTE — Telephone Encounter (Signed)
 Disregard last note, looks like Leonia Raman wants Repatha.

## 2024-03-25 NOTE — Addendum Note (Signed)
 Addended by: Sunny English on: 03/25/2024 02:41 PM   Modules accepted: Orders

## 2024-03-25 NOTE — Telephone Encounter (Signed)
 Can we clarify dosing and which PCSK9i patient should start the PAP process on?

## 2024-03-25 NOTE — Telephone Encounter (Signed)
 Waiting on clarification as to which PCSK9i they want patient on so we know which PAP application to begin with.

## 2024-03-26 ENCOUNTER — Telehealth: Payer: Self-pay | Admitting: Licensed Clinical Social Worker

## 2024-03-26 NOTE — Telephone Encounter (Signed)
 Will sign when I am back in the office.

## 2024-03-26 NOTE — Progress Notes (Signed)
 Pt assist paperwork printed and placed on provider's desk for signing.

## 2024-03-26 NOTE — Progress Notes (Signed)
 Heart and Vascular Care Navigation  03/26/2024  Chase Jensen 29-May-1966 [id number]  Reason for Referral: self pay Patient is participating in a Managed Medicaid Plan: No, self pay  Engaged with patient by telephone for initial visit for Heart and Vascular Care Coordination.                                                                                                   Assessment:                                     LCSW was able to reach pt at [phone]. Introduced self, role, reason for call. Confirmed home address, PCP, and emergency contact remains his wife. He and his wife are both employed, at this time do not qualify for Medicaid. It is just he and his wife in the home. They do not have issues with food, transportation or housing costs noted at this time. We discussed that pt may be eligible for assistance with his medical bills since he is self pay and not Medicaid eligible. LCSW explained [employer] program and pt interested in applying. Some of his medications are able available through the [facility] program and he is also agreeable to reviewing that application.   No additional questions/concerns at this time. Will f/u to ensure he receives documents and explain how to complete/submit.   HRT/VAS Care Coordination     Patients Home Cardiology Office [facility]   Outpatient Care Team Social Worker   Social Worker Name: Marit Lark, [place], [phone]   Living arrangements for the past 2 months Single Family Home   Lives with: Spouse   Patient Current Insurance Coverage Self-Pay   Patient Has Concern With Paying Medical Bills Yes   Patient Concerns With Medical Bills self pay, ineligible for Medicaid   Medical Bill Referrals: CAFA   Does Patient Have Prescription Coverage? No   Patient Prescription Assistance Programs [facility]       Social History:                                                                             SDOH  Screenings   Food Insecurity: No Food Insecurity (03/26/2024)  Housing: Low Risk  (03/26/2024)  Transportation Needs: No Transportation Needs (03/26/2024)  Utilities: Not At Risk (03/26/2024)  Depression (PHQ2-9): Low Risk  (01/21/2024)  Financial Resource Strain: Low Risk  (03/26/2024)  Tobacco Use: Low Risk  (03/16/2024)  Health Literacy: Adequate Health Literacy (03/26/2024)    SDOH Interventions: Financial Resources:  Surveyor, quantity Strain Interventions: Artist (sent [employer]) Editor, commissioning for [employer] Program  Food Insecurity:  Food Insecurity Interventions: Intervention Not Indicated  Housing Insecurity:  Housing  Interventions: Intervention Not Indicated  Transportation:   Transportation Interventions: Intervention Not Indicated     Other Care Navigation Interventions:     Provided Pharmacy assistance resources [facility]   Follow-up plan:   LCSW sent pt the following: my card, [facility] and [employer] application. I did ask that pt call me back when he has received the applications and let me know if I can be of any further assistance. If I do not hear from him I will try and reach out to offer assistance.

## 2024-03-26 NOTE — Telephone Encounter (Signed)
 Repatha PAP application has been mailed to patient.

## 2024-03-26 NOTE — Telephone Encounter (Signed)
 Please have provider sign and date their portion of patient's PAP application (see chart media). Please fax completed form to [phone]

## 2024-03-31 ENCOUNTER — Other Ambulatory Visit (HOSPITAL_COMMUNITY): Payer: Self-pay

## 2024-03-31 ENCOUNTER — Telehealth: Payer: Self-pay | Admitting: Emergency Medicine

## 2024-03-31 NOTE — Progress Notes (Signed)
 Med Assist form signed by Bernardino Bring and faxed to [phone],

## 2024-03-31 NOTE — Telephone Encounter (Signed)
 Faxed completed provider form

## 2024-03-31 NOTE — Telephone Encounter (Signed)
 Hi Noel, thanks for reaching out. Typically we just request you fax it back to us  so that we are able to do a final review before combining it with the patient's portion. Once we get the provider portion back we would fax the completed app to company at that time. Hope this helps.

## 2024-03-31 NOTE — Telephone Encounter (Signed)
 Chase Jensen,   I talked with [name] and faxed over signed paperwork - I noticed that there is a fax to number at the bottom of the pt assistance sheet.  Should I fax the form back to the pharmaceutical company as well?  Thanks, [name]

## 2024-04-06 NOTE — Telephone Encounter (Signed)
Scanned in media as well.

## 2024-04-06 NOTE — Telephone Encounter (Signed)
 Patient approved for Repatha PAP. Letter has been sent to patient.

## 2024-04-09 ENCOUNTER — Telehealth: Payer: Self-pay | Admitting: Licensed Clinical Social Worker

## 2024-04-09 NOTE — Telephone Encounter (Signed)
 H&V Care Navigation CSW Progress Note  Clinical Social Worker contacted patient by phone to f/u on assistance applications.  Pt still working on gathering documents, has submitted Repatha assistance. 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 Also discussed how to submit applications when completed. Sent this information and letter of income from [facility]. Pt and his wife are going out of town this weekend to visit aging family and he will work on this as he is able. Thanked him for his diligence and encouraged him to call as needed.   Patient is participating in a Managed Medicaid Plan:  No, self pay only  SDOH Screenings   Food Insecurity: No Food Insecurity (03/26/2024)  Housing: Low Risk  (03/26/2024)  Transportation Needs: No Transportation Needs (03/26/2024)  Utilities: Not At Risk (03/26/2024)  Depression (PHQ2-9): Low Risk  (01/21/2024)  Financial Resource Strain: Low Risk  (03/26/2024)  Tobacco Use: Low Risk  (03/16/2024)  Health Literacy: Adequate Health Literacy (03/26/2024)      Marit Lark, MSW, LCSW Clinical Social Worker II [facility] Health Heart/Vascular Care Navigation  [phone]- work cell phone (preferred)

## 2024-04-16 ENCOUNTER — Telehealth: Payer: Self-pay | Admitting: Cardiovascular Disease

## 2024-04-16 NOTE — Telephone Encounter (Signed)
 Returned pt's call and spoke to the patient.  Pt is currently on Plavix  and Aspirin  and wanted to know what OTC pain medication he can take to help alleviate his sciatic pain.  Explained to patient that he could not take ibuprofen or NSAIDS (example:  Naproxen) due to him being on blood thinner and it would increase the potential for bleeding;  suggested that patient could take Tylenol  Extra Strength and follow the recommended dosage.  Pt verbalized understanding and thanked me for calling him back with a solution to help with his pain.

## 2024-04-16 NOTE — Telephone Encounter (Signed)
 Pt is requesting a callback regarding him wanting to f/u on his MyChart message What pain management medication am I allowed to take? I am having severe sciatica pain and need something to alleviate the pain. He was given a response of Your pain management physician will review medications to determine which one will work for you. This morning but pt is still having pain and hasn't heard anything. Please advise

## 2024-04-21 ENCOUNTER — Telehealth: Payer: Self-pay | Admitting: Licensed Clinical Social Worker

## 2024-04-21 NOTE — Telephone Encounter (Signed)
 H&V Care Navigation CSW Progress Note  Clinical Social Worker contacted patient by phone to f/u on missed call. Was able to leave a voicemail at [phone] which was promptly returned by pt. Reviewed applications again and where to send documents. Pt advised to send copies and retain orginals for himself. I am able to check those applications for any missing information once submitted. Pt hopeful to have them mailed by Thursday. I remain available.  Patient is participating in a Managed Medicaid Plan:  No, self pay only  SDOH Screenings   Food Insecurity: No Food Insecurity (03/26/2024)  Housing: Low Risk  (03/26/2024)  Transportation Needs: No Transportation Needs (03/26/2024)  Utilities: Not At Risk (03/26/2024)  Depression (PHQ2-9): Low Risk  (01/21/2024)  Financial Resource Strain: Low Risk  (03/26/2024)  Tobacco Use: Low Risk  (03/16/2024)  Health Literacy: Adequate Health Literacy (03/26/2024)    Marit Lark, MSW, LCSW Clinical Social Worker II [facility] Heart/Vascular Care Navigation  [phone]- work cell phone (preferred)

## 2024-04-23 ENCOUNTER — Telehealth: Payer: Self-pay | Admitting: Licensed Clinical Social Worker

## 2024-04-23 NOTE — Telephone Encounter (Signed)
 H&V Care Navigation CSW Progress Note  Clinical Social Worker contacted patient by phone to f/u on missed call. Reached pt at [phone]. He shares applications completed, has sent in [facility] application and inquired about sending me CAFA to submit. LCSW was able to receive applications and supporting documents. These were printed and mailed into patient accounting for processing for assistance. Will f/u on [employer] next week- sometimes takes a week or two to process.  Patient is participating in a Managed Medicaid Plan:  No, self pay only  SDOH Screenings   Food Insecurity: No Food Insecurity (03/26/2024)  Housing: Low Risk  (03/26/2024)  Transportation Needs: No Transportation Needs (03/26/2024)  Utilities: Not At Risk (03/26/2024)  Depression (PHQ2-9): Low Risk  (01/21/2024)  Financial Resource Strain: Low Risk  (03/26/2024)  Tobacco Use: Low Risk  (03/16/2024)  Health Literacy: Adequate Health Literacy (03/26/2024)    Marit Lark, MSW, LCSW Clinical Social Worker II [facility] Health Heart/Vascular Care Navigation  [phone]- work cell phone (preferred)

## 2024-05-08 ENCOUNTER — Telehealth (HOSPITAL_BASED_OUTPATIENT_CLINIC_OR_DEPARTMENT_OTHER): Payer: Self-pay | Admitting: Licensed Clinical Social Worker

## 2024-05-08 NOTE — Telephone Encounter (Signed)
 H&V Care Navigation CSW Progress Note  Clinical Social Worker confirmed [employer] has been received and is in process with Francis, patient accounting.  Also called [employer] and pt approved for assistance through 03/08/25. Will request refills from [facility] team be sent there, pt will need to coordinate non Heartcare meds through PCP team for refills. Some medications are not covered at this time, recommend that pt continue to use pharmacy of his choice/[facility] as desired. Will send myChart message to pt to update him with approval.  Patient is participating in a Managed Medicaid Plan:  No, self pay only  SDOH Screenings   Food Insecurity: No Food Insecurity (03/26/2024)  Housing: Low Risk  (03/26/2024)  Transportation Needs: No Transportation Needs (03/26/2024)  Utilities: Not At Risk (03/26/2024)  Depression (PHQ2-9): Low Risk  (01/21/2024)  Financial Resource Strain: Low Risk  (03/26/2024)  Tobacco Use: Low Risk  (03/16/2024)  Health Literacy: Adequate Health Literacy (03/26/2024)    Marit Lark, MSW, LCSW Clinical Social Worker II [facility] Heart/Vascular Care Navigation  [phone]- work cell phone (preferred)

## 2024-05-13 ENCOUNTER — Other Ambulatory Visit: Payer: Self-pay | Admitting: Pharmacist Clinician (PhC)/ Clinical Pharmacy Specialist

## 2024-05-13 MED ORDER — ASPIRIN 81 MG PO TBEC: 81.0000 mg | DELAYED_RELEASE_TABLET | Freq: Every day | ORAL | 3 refills | Status: AC

## 2024-05-13 MED ORDER — ATORVASTATIN CALCIUM 80 MG PO TABS: 80.0000 mg | ORAL_TABLET | Freq: Every day | ORAL | 3 refills | Status: DC

## 2024-05-13 MED ORDER — CLOPIDOGREL BISULFATE 75 MG PO TABS: 75.0000 mg | ORAL_TABLET | Freq: Every day | ORAL | 3 refills | Status: AC

## 2024-06-04 NOTE — Progress Notes (Signed)
 [facility] [address] [place] [address] Phone: [phone] Subjective:   Chase [facility], am serving as a scribe for Dr. Arthea Claudene.  I'm seeing this patient by the request  of:  Diedra Lame, MD  CC: Left knee pain  YEP:Dlagzrupcz  Chase Jensen is a 58 y.o. male coming in with complaint of L knee pain, seen by primary care 3 weeks ago.  At that time had tenderness noted over the medial joint line. Pain over patellar tendon and lateral knee. Has gotten injections from Dr. Joane. Started in November. Doing acupuncture helps for a couple of hours. Pain can be sharp in nature. Has waken him up at night. Has been taking extra strength tylenol . Ice makes it feel worse. Heat is better.    Patient's previous imaging showed an x-ray in 2024 and had very minimal arthritic changes. Ultrasound previously did have extrusion of the lateral meniscus in February of this year.  Given a steroid injection at that time as well.  Past Medical History:  Diagnosis Date   Coronary artery disease 2006    balloon angioplasty without stent placement to one-vessel done at [facility] in [place]     Hyperlipemia    Hypertension    Hypothyroidism    Past Surgical History:  Procedure Laterality Date   CARDIAC SURGERY     CHOLECYSTECTOMY     COLONOSCOPY WITH PROPOFOL  N/A 07/29/2015   Procedure: COLONOSCOPY WITH PROPOFOL ;  Surgeon: Donnice Vaughn Manes, MD;  Location: [facility] ENDOSCOPY;  Service: Endoscopy;  Laterality: N/A;   CORONARY ANGIOPLASTY     CORONARY STENT INTERVENTION N/A 12/18/2023   Procedure: CORONARY STENT INTERVENTION;  Surgeon: Darron Deatrice LABOR, MD;  Location: [facility] INVASIVE CV LAB;  Service: Cardiovascular;  Laterality: N/A;  prox LAD   CORONARY STENT INTERVENTION N/A 02/26/2024   Procedure: CORONARY STENT INTERVENTION;  Surgeon: Darron Deatrice LABOR, MD;  Location: [facility] INVASIVE CV LAB;  Service: Cardiovascular;  Laterality: N/A;   HERNIA  REPAIR     LEFT HEART CATH AND CORONARY ANGIOGRAPHY N/A 12/18/2023   Procedure: LEFT HEART CATH AND CORONARY ANGIOGRAPHY;  Surgeon: Darron Deatrice LABOR, MD;  Location: [facility] INVASIVE CV LAB;  Service: Cardiovascular;  Laterality: N/A;   LEFT HEART CATH AND CORONARY ANGIOGRAPHY N/A 02/26/2024   Procedure: LEFT HEART CATH AND CORONARY ANGIOGRAPHY;  Surgeon: Darron Deatrice LABOR, MD;  Location: [facility] INVASIVE CV LAB;  Service: Cardiovascular;  Laterality: N/A;   NASAL SINUS SURGERY     PILONIDAL CYST DRAINAGE     TONSILLECTOMY     Social History   Socioeconomic History   Marital status: Married    Spouse name: Not on file   Number of children: Not on file   Years of education: Not on file   Highest education level: Not on file  Occupational History   Not on file  Tobacco Use   Smoking status: Never   Smokeless tobacco: Never  Vaping Use   Vaping status: Never Used  Substance and Sexual Activity   Alcohol use: Not on file   Drug use: Not on file   Sexual activity: Not on file  Other Topics Concern   Not on file  Social History Narrative   Not on file   Social Drivers of Health   Financial Resource Strain: Low Risk  (03/26/2024)   Overall Financial Resource Strain (CARDIA)    Difficulty of Paying Living Expenses: Not very hard  Food Insecurity: No Food Insecurity (03/26/2024)  Hunger Vital Sign    Worried About Running Out of Food in the Last Year: Never true    Ran Out of Food in the Last Year: Never true  Transportation Needs: No Transportation Needs (03/26/2024)   PRAPARE - Administrator, Civil Service (Medical): No    Lack of Transportation (Non-Medical): No  Physical Activity: Not on file  Stress: Not on file  Social Connections: Not on file   Allergies  Allergen Reactions   Imdur  [Isosorbide  Nitrate]     Headache    Zetia  [Ezetimibe ]     Leg cramps   Penicillins Rash   Propoxyphene Rash    Darvocet N Acetaminophen    No family history on file.  Current  Outpatient Medications (Endocrine & Metabolic):    levothyroxine  (SYNTHROID ) 150 MCG tablet, Take 150 mcg by mouth daily before breakfast.  Current Outpatient Medications (Cardiovascular):    atorvastatin  (LIPITOR ) 80 MG tablet, Take 1 tablet (80 mg total) by mouth daily.   metoprolol  succinate (TOPROL  XL) 25 MG 24 hr tablet, Take 1 tablet (25 mg total) by mouth at bedtime.   nitroGLYCERIN  (NITROSTAT ) 0.4 MG SL tablet, Place 1 tablet (0.4 mg total) under the tongue every 5 (five) minutes as needed for chest pain.  Current Outpatient Medications (Respiratory):    fluticasone  (FLONASE ) 50 MCG/ACT nasal spray, Place 1 spray into the nose daily as needed for allergies.  Current Outpatient Medications (Analgesics):    aspirin  EC 81 MG tablet, Take 1 tablet (81 mg total) by mouth daily. Swallow whole.   SUMAtriptan  (IMITREX ) 100 MG tablet, Take 100 mg by mouth every 2 (two) hours as needed for migraine.  Current Outpatient Medications (Hematological):    clopidogrel  (PLAVIX ) 75 MG tablet, Take 1 tablet (75 mg total) by mouth daily.  Current Outpatient Medications (Other):    Ascorbic Acid (VITAMIN C PO), Take 1,000 mg by mouth daily.   OVER THE COUNTER MEDICATION, Take 1 packet by mouth daily. Multivitamin Packet - Multivitamin, Fish Oil, Omega Blend, Turmeric, Coq10, Vit K2, Boron   Reviewed prior external information including notes and imaging from  primary care provider As well as notes that were available from care everywhere and other healthcare systems.  Past medical history, social, surgical and family history all reviewed in electronic medical record.  No pertanent information unless stated regarding to the chief complaint.   Review of Systems:  No headache, visual changes, nausea, vomiting, diarrhea, constipation, dizziness, abdominal pain, skin rash, fevers, chills, night sweats, weight loss, swollen lymph nodes, body aches, joint swelling, chest pain, shortness of breath, mood  changes. POSITIVE muscle aches  Objective  Blood pressure 136/78, pulse 61, height 6' (1.829 m), weight 213 lb (96.6 kg), SpO2 97%.   General: No apparent distress alert and oriented x3 mood and affect normal, dressed appropriately.  HEENT: Pupils equal, extraocular movements intact  Respiratory: Patient's speak in full sentences and does not appear short of breath  Cardiovascular: No lower extremity edema, non tender, no erythema  Right knee exam does have swelling noted on the lateral aspect of the knee.  Positive tenderness over the lateral compartment of the knee.  Positive McMurray's sign noted.  Lacks last 15 degrees of flexion.  Antalgic gait noted.  Limited muscular skeletal ultrasound was performed and interpreted by CLAUDENE HUSSAR, M  Limited ultrasound shows significant displacement noted of the lateral meniscus.  Greater than 50%.  Parameniscal cyst also noted.  Mild to moderate narrowing of the lateral space. Impression:  Significant lateral meniscal tear with parameniscal cyst    Impression and Recommendations:    The above documentation has been reviewed and is accurate and complete Kaianna Dolezal M Charolette Bultman, DO

## 2024-06-10 ENCOUNTER — Other Ambulatory Visit: Payer: Self-pay

## 2024-06-10 ENCOUNTER — Encounter: Payer: Self-pay | Admitting: Family Medicine

## 2024-06-10 ENCOUNTER — Ambulatory Visit: Payer: Self-pay | Admitting: Family Medicine

## 2024-06-10 VITALS — BP 136/78 | HR 61 | Ht 72.0 in | Wt 213.0 lb

## 2024-06-10 DIAGNOSIS — G8929 Other chronic pain: Secondary | ICD-10-CM

## 2024-06-10 DIAGNOSIS — M23002 Cystic meniscus, unspecified lateral meniscus, unspecified knee: Secondary | ICD-10-CM

## 2024-06-10 DIAGNOSIS — S83262A Peripheral tear of lateral meniscus, current injury, left knee, initial encounter: Secondary | ICD-10-CM

## 2024-06-10 DIAGNOSIS — M25562 Pain in left knee: Secondary | ICD-10-CM

## 2024-06-10 DIAGNOSIS — M1712 Unilateral primary osteoarthritis, left knee: Secondary | ICD-10-CM

## 2024-06-10 NOTE — Patient Instructions (Addendum)
 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

## 2024-06-10 NOTE — Assessment & Plan Note (Signed)
 Appears to have significant protrusion of greater than 50% of the lateral meniscus noted with some mild to moderate arthritic changes of the lateral compartment.  Patient does have also a parameniscal cyst on this side it appears on ultrasound.  Due to the severity of patient's symptoms as well as the instability do feel advanced imaging with an MRI is necessary.  Over the course of several years patient has had injections in the knees, oral anti-inflammatories, recently did have 3 stents in the heart so would like to avoid that again.  Patient has also done formal physical therapy.  At this point I do feel the advanced imaging is warranted and depending on severity may need surgical intervention.

## 2024-06-16 ENCOUNTER — Ambulatory Visit
Admission: RE | Admit: 2024-06-16 | Discharge: 2024-06-16 | Disposition: A | Discharge status: Home / Self Care | Payer: Self-pay | Source: Ambulatory Visit | Attending: Family Medicine | Admitting: Family Medicine

## 2024-06-16 DIAGNOSIS — G8929 Other chronic pain: Secondary | ICD-10-CM

## 2024-06-17 ENCOUNTER — Ambulatory Visit: Payer: Self-pay | Admitting: Family Medicine

## 2024-06-18 ENCOUNTER — Other Ambulatory Visit: Payer: Self-pay

## 2024-06-18 DIAGNOSIS — M25562 Pain in left knee: Secondary | ICD-10-CM

## 2024-07-10 ENCOUNTER — Encounter: Payer: Self-pay | Admitting: Family Medicine

## 2024-07-26 ENCOUNTER — Encounter: Payer: Self-pay | Admitting: Family Medicine

## 2024-07-26 DIAGNOSIS — S83262A Peripheral tear of lateral meniscus, current injury, left knee, initial encounter: Secondary | ICD-10-CM

## 2024-07-26 DIAGNOSIS — M25562 Pain in left knee: Secondary | ICD-10-CM

## 2024-07-26 DIAGNOSIS — G8929 Other chronic pain: Secondary | ICD-10-CM

## 2024-07-28 NOTE — Addendum Note (Signed)
 Addended by: MARDY LEOTIS RAMAN on: 07/28/2024 04:14 PM   Modules accepted: Orders

## 2024-08-12 ENCOUNTER — Telehealth: Payer: Self-pay | Admitting: Physician Assistant

## 2024-08-12 ENCOUNTER — Other Ambulatory Visit: Payer: Self-pay

## 2024-08-12 ENCOUNTER — Other Ambulatory Visit: Payer: Self-pay | Admitting: Orthopedic Surgery

## 2024-08-12 NOTE — Telephone Encounter (Signed)
 Per surgeon office, yes they need Plavix  hold instructions.     CORRECTION ON SURGEON, NOT TBD;   SURGEON IS DR. NORLEEN GRAVES  CORRECTION ON PH# [phone]

## 2024-08-12 NOTE — Telephone Encounter (Signed)
 Pt has appt 08/26/24 with Bernardino Bring, PAC.

## 2024-08-12 NOTE — Telephone Encounter (Signed)
   Pre-operative Risk Assessment    Patient Name: Chase Jensen  DOB: 11-28-1965 MRN: [id number]   Date of last office visit: unknown Date of next office visit: unknown   Request for Surgical Clearance    Procedure:  left knee arthroscory  Date of Surgery:  Clearance 09/02/24                                Surgeon:  TBD Surgeon's Group or Practice Name:  [facility] Phone number:  [phone] Fax number:  [phone]   Type of Clearance Requested:   - Medical    Type of Anesthesia:  General    Additional requests/questions:    SignedBerwyn LELON Sprung   08/12/2024, 1:33 PM

## 2024-08-12 NOTE — Telephone Encounter (Signed)
 On review of medications, patient is on Plavix . Please confirm that this is not a medication that needs to be held. Thank you,    KL

## 2024-08-22 NOTE — Progress Notes (Unsigned)
 Cardiology Office Note    Date:  08/26/2024   ID:  Chase Jensen, DOB 06/04/1966, MRN [id number]  PCP:  Diedra Lame, MD  Cardiologist:  Deatrice Cage, MD  Electrophysiologist:  None   Chief Complaint: Follow-up  History of Present Illness:   Chase Jensen is a 58 y.o. male with history of CAD status post balloon angioplasty without stent in 2006 in [place], [place]  s/p PCI/DES to the LAD in 12/2023 s/p PCI/DES x 2 to the superior branch of the ramus in 02/2024, dilated ascending thoracic aorta, frequent PVCs, HTN, severe hyperlipidemia, and family history of premature CAD with his father having CABG at age 11 who presents for follow-up of CAD.  Prior cardiology care in [place], [place] , and [place], [place] .  He underwent balloon angioplasty without stent placement in 2006 in [place], [place] .  At that time, no stent was placed due to bifurcation location.  He is a non-smoker.  He was last seen in the office in 07/2023 noting his lower extremity nocturnal cramping that felt similar to what he experienced while on ezetimibe .  He also noted lower extremity swelling that was progressive throughout the day and improved early in the morning.  He was without symptoms of shortness of breath or orthopnea.  Echo in 08/2023 showed an EF of 60 to 65%, no regional wall motion abnormalities, normal LV diastolic function parameters, normal RV systolic function and ventricular cavity size, tricuspid aortic valve, and mild dilatation of the ascending aorta measuring 39 mm.   He was seen in the office on 12/03/2023 noting an episode of exertional chest discomfort several days prior that improved after belching and rest.  In this setting he underwent coronary CTA on 12/12/2023 that showed a calcium  score of 267, which was the 85th percentile.  There was severe (greater than 70%) proximal LAD stenosis with less than 25% proximal LCx stenosis.  ctFFR 0.65 in the proximal LAD.  Aorta  documented to be normal size during this study.  In this setting he underwent LHC on 12/18/2023 that showed severe two-vessel CAD involving the proximal/mid LAD and superior branch of the ramus artery as outlined below.  He underwent successful PCI/DES to the LAD.  It was recommended to treat the ramus branch medically given diameter of 2.5 mm and being diseased in 2 different areas with consideration for PCI for refractory angina despite pharmacotherapy.  He followed up in the office on 01/14/2024 continuing to note intermittent chest discomfort, particularly when laying on his left or right sides.  He also continued to note intermittent palpitations.  To escalate antianginal therapy he was started on Toprol -XL 12.5 mg.  Zio patch showed a predominant rhythm of sinus with an average rate of 66 bpm (range 46 to 120 bpm) with frequent PVCs with an overall burden of 6.4%.  He contacted our office continuing to have intermittent chest discomfort and was started on Imdur  with subsequent MyChart message noting headache approximately 1 hour after taking this.  He was last seen in the office on 02/21/2024 continuing to have exertional substernal chest discomfort with associated dyspnea.  In this setting he underwent LHC on 02/26/2024 that showed widely patent LAD stent with no significant restenosis.  There was worsening disease in the superior branch of the ramus artery.  He underwent successful PCI/DES with 2-overlapping stents to the superior branch of the ramus artery.  Following the procedure, he did continue to note some intermittent palpitations leading Toprol -XL to be titrated to 25 mg  daily with improvement in symptoms.  He was most recently seen in the office in 03/2024 and continued to improve following PCI in 02/2024, able to go for a walk of 2 miles without cardiac limitation at that time.  He comes in doing well from a cardiac perspective and is without symptoms of angina or cardiac decompensation.  He does  continue to note stable intermittent episodes of chest discomfort while laying on his side.  No exertional symptoms.  No shortness of breath, dizziness, presyncope, or syncope.  He has been less active in the setting of left knee pain, which is his main limiting factor at this time.  No falls or symptoms concerning for bleeding.  Blood pressure has been well-controlled at home outside of an isolated reading of 162 mmHg systolic last month.  Overall feels like he is doing well.  He is scheduled to undergo arthroscopic knee surgery around meniscectomy on 09/02/2024.  [facility] Activity Status Index: > 4 METs Revised Cardiac Risk Index: low risk for noncardiac surgery with an estimated rate of 0.9% for adverse cardiac outcome in the periprocedural timeframe   Labs independently reviewed: 06/2024 - Hgb 14.7, PLT 205, potassium 4.4, BUN 18, serum creatinine 1.0, albumin 4.4, AST/ALT normal, TC 102, TG 92, HDL 59, LDL 25, TSH 6.906   Past Medical History:  Diagnosis Date   Coronary artery disease 2006    balloon angioplasty without stent placement to one-vessel done at [facility] in [place]     Hyperlipemia    Hypertension    Hypothyroidism     Past Surgical History:  Procedure Laterality Date   CARDIAC SURGERY     CHOLECYSTECTOMY     COLONOSCOPY WITH PROPOFOL  N/A 07/29/2015   Procedure: COLONOSCOPY WITH PROPOFOL ;  Surgeon: Donnice Vaughn Manes, MD;  Location: [facility];  Service: Endoscopy;  Laterality: N/A;   CORONARY ANGIOPLASTY     CORONARY STENT INTERVENTION N/A 12/18/2023   Procedure: CORONARY STENT INTERVENTION;  Surgeon: Darron Deatrice LABOR, MD;  Location: [facility] INVASIVE CV LAB;  Service: Cardiovascular;  Laterality: N/A;  prox LAD   CORONARY STENT INTERVENTION N/A 02/26/2024   Procedure: CORONARY STENT INTERVENTION;  Surgeon: Darron Deatrice LABOR, MD;  Location: [facility] INVAS[facility] CV LAB;  Service: Cardiovascular;  Laterality: N/A;   HERNIA REPAIR     LEFT HEART CATH AND CORONARY  ANGIOGRAPHY N/A 12/18/2023   Procedure: LEFT HEART CATH AND CORONARY ANGIOGRAPHY;  Surgeon: Darron Deatrice LABOR, MD;  Location: [facility] CV [facility];  Service: Cardiovascular;  Laterality: N/A;   LEFT HEART CATH AND CORONARY ANGIOGRAPHY N/A 02/26/2024   Procedure: LEFT HEART CATH AND CORONARY ANGIOGRAPHY;  Surgeon: Darron Deatrice LABOR, MD;  Location: [facility];  Service: Cardiovascular;  Laterality: N/A;   NASAL SINUS SURGERY     PILONIDAL CYST DRAINAGE     TONSILLECTOMY      Current Medications: Current Meds  Medication Sig   [DISCONTINUED] Evolocumab (REPATHA SURECLICK) 140 MG/ML SOAJ Inject 140 mg into the skin.    Allergies:   Imdur  [isosorbide  nitrate], Zetia  [ezetimibe ], Penicillins, and Propoxyphene   Social History   Socioeconomic History   Marital status: Married    Spouse name: Not on file   Number of children: Not on file   Years of education: Not on file   Highest education level: Not on file  Occupational History   Not on file  Tobacco Use   Smoking status: Never   Smokeless tobacco: Never  Vaping Use   Vaping status: Never  Used  Substance and Sexual Activity   Alcohol use: Not on file   Drug use: Not on file   Sexual activity: Not on file  Other Topics Concern   Not on file  Social History Narrative   Not on file   Social Drivers of Health   Financial Resource Strain: Low Risk  (03/26/2024)   Overall Financial Resource Strain (CARDIA)    Difficulty of Paying Living Expenses: Not very hard  Food Insecurity: No Food Insecurity (03/26/2024)   Hunger Vital Sign    Worried About Running Out of Food in the Last Year: Never true    Ran Out of Food in the Last Year: Never true  Transportation Needs: No Transportation Needs (03/26/2024)   PRAPARE - Administrator, Civil Service (Medical): No    Lack of Transportation (Non-Medical): No  Physical Activity: Not on file  Stress: Not on file  Social Connections: Not on file     Family History:  The  patient's family history is not on file.  ROS:   12-point review of systems is negative unless otherwise noted in the HPI.   EKGs/Labs/Other Studies Reviewed:    Studies reviewed were summarized above. The additional studies were reviewed today:  2D echo 08/12/2023: 1. Left ventricular ejection fraction, by estimation, is 60 to 65%. The  left ventricle has normal function. The left ventricle has no regional  wall motion abnormalities. Left ventricular diastolic parameters were  normal. The average left ventricular  global longitudinal strain is -16.0 %. The global longitudinal strain is  normal.   2. Right ventricular systolic function is normal. The right ventricular  size is normal.   3. The mitral valve is normal in structure. No evidence of mitral valve  regurgitation.   4. The aortic valve is tricuspid. Aortic valve regurgitation is not  visualized.   5. Aortic dilatation noted. There is mild dilatation of the ascending  aorta, measuring 39 mm.   6. The inferior vena cava is normal in size with greater than 50%  respiratory variability, suggesting right atrial pressure of 3 mmHg.  __________   Coronary CTA 12/12/2023: FINDINGS: Aorta:  Normal size.  No calcifications.  No dissection.   Aortic Valve:  Trileaflet.  No calcifications.   Coronary Arteries:  Normal coronary origin.  Right dominance.   RCA is a dominant artery. There is calcified plaque in the distal RCA causing mild stenosis (25-49%).   Left main gives rise to LAD and LCX arteries. LM has no disease.   LAD has non calcified plaque proximally causing severe stenosis (>70%).   LCX is a non-dominant artery. There is calcified plaque in the proximal LCx causing minimal stenosis (<25%).   Other findings:   Normal pulmonary vein drainage into the left atrium.   Normal left atrial appendage without a thrombus.   Normal size of the pulmonary artery.   IMPRESSION: 1. Coronary calcium  score of 267. This  was 85th percentile for age and sex matched control.   2. Normal coronary origin with right dominance.   3. Severe proximal LAD stenosis (>70%).   4. Minimal proximal LCx stenosis (<25%).   5. CAD-RADS 4 Severe stenosis. (70-99% or > 50% left main). Cardiac catheterization is recommended. Consider symptom-guided anti-ischemic pharmacotherapy as well as risk factor modification per guideline directed care.   6. Additional analysis with CT FFR will be submitted and reported separately.     CT FFR: 1. Left Main:  No significant stenosis.  2. LAD: significant stenosis in proximal LAD.  FFRct 0.65 3. LCX: No significant stenosis.  FFRct 0.80 4. RCA: No significant stenosis.  FFRct 0.90   IMPRESSION: 1. CT FFR analysis showed significant stenosis in the proximal LAD. FFRct 0.65.   2.  Cardiac catheterization recommended. __________   [facility] 12/18/2023:   Ramus-2 lesion is 95% stenosed.   Ramus-1 lesion is 80% stenosed.   Prox RCA lesion is 20% stenosed.   RPAV lesion is 30% stenosed.   Prox LAD to Mid LAD lesion is 95% stenosed.   Dist LAD lesion is 40% stenosed.   1st Sept lesion is 90% stenosed.   A drug-eluting stent was successfully placed using a STENT SYS SYNERGY XD 3.5X20.   Post intervention, there is a 0% residual stenosis.   The left ventricular systolic function is normal.   LV end diastolic pressure is mildly elevated.   The left ventricular ejection fraction is 55-65% by visual estimate.   1.  Severe two-vessel coronary artery disease involving the proximal/mid LAD and the superior branch of the ramus artery. 2.  Normal LV systolic function and mildly elevated left ventricular end-diastolic pressure. 3.  Successful angioplasty and drug-eluting stent placement to the LAD using a 3.5 mm stent which was postdilated to 4.2 millimeter distally and 4.5 mm proximally   Recommendations: Dual antiplatelet therapy for at least 6 months. Aggressive treatment of risk  factors. Will treat the ramus branch medically for now given diameter of 2.5 and being diseased in 2 different areas.  However, if the patient has residual angina, this can be treated with PCI. __________   Zio patch 01/2024: Patient had a min HR of 46 bpm, max HR of 120 bpm, and avg HR of 66 bpm. Predominant underlying rhythm was Sinus Rhythm. Isolated SVEs were rare (<1.0%), SVE Couplets were rare (<1.0%), and SVE Triplets were rare (<1.0%). Isolated VEs were frequent  (6.4%, 80371), VE Couplets were rare (<1.0%, 2899), and no VE Triplets were present. Ventricular Bigeminy and Trigeminy were present.  __________   [facility] 02/26/2024:   Dist LAD lesion is 40% stenosed.   Prox RCA lesion is 20% stenosed.   1st Sept lesion is 90% stenosed.   RPAV lesion is 30% stenosed.   RPDA lesion is 30% stenosed.   Ramus-2 lesion is 99% stenosed.   Ramus-1 lesion is 95% stenosed.   Ramus-3 lesion is 60% stenosed.   Non-stenotic Prox LAD to Mid LAD lesion was previously treated.   A drug-eluting stent was successfully placed using a STENT SYNERGY XD 2.25X28.   A drug-eluting stent was successfully placed using a STENT SYNERGY XD 2.50X20.   Post intervention, there is a 0% residual stenosis.   Post intervention, there is a 0% residual stenosis.   The left ventricular systolic function is normal.   LV end diastolic pressure is mildly elevated.   The left ventricular ejection fraction is 55-65% by visual estimate.   In the absence of any other complications or medical issues, we expect the patient to be ready for discharge from an interventional cardiology perspective on 02/26/2024.   Recommend dual antiplatelet therapy with Aspirin  81mg  daily and Clopidogrel  75mg  daily.   1.  Widely patent LAD stent with no significant restenosis.  Worsening disease in the superior branch of the ramus artery. 2.  Normal LV systolic function and mildly elevated left ventricular end-diastolic pressure. 3.  Successful  angioplasty and 2 overlapped drug-eluting stent placement to the superior branch of the ramus artery.  Recommendations: Lifelong dual antiplatelet therapy if tolerated. The patient has aggressive disease.  Recommend a target LDL of less than 55 with a low threshold for using a PCSK9 inhibitor. Continue management of PVCs.   EKG:  EKG is ordered today.  The EKG ordered today demonstrates sinus bradycardia, 53 bpm, no acute ST-T changes  Recent Labs: 02/21/2024: BUN 20; Creatinine, Ser 1.05; Hemoglobin 14.9; Platelets 203; Potassium 4.8; Sodium 143 03/16/2024: ALT 24  Recent Lipid Panel    Component Value Date/Time   CHOL 143 03/16/2024 0909   TRIG 95 03/16/2024 0909   HDL 48 03/16/2024 0909   CHOLHDL 3.0 03/16/2024 0909   CHOLHDL 2.4 08/07/2019 0921   VLDL 17 08/07/2019 0921   LDLCALC 77 03/16/2024 0909   LDLDIRECT 71 03/16/2024 0909    PHYSICAL EXAM:    VS:  BP 118/62 (BP Location: Left Arm, Patient Position: Sitting, Cuff Size: Normal)   Pulse (!) 53   Ht 6' (1.829 m)   Wt 215 lb (97.5 kg)   SpO2 98%   BMI 29.16 kg/m   BMI: Body mass index is 29.16 kg/m.  Physical Exam Vitals reviewed.  Constitutional:      Appearance: He is well-developed.  HENT:     Head: Normocephalic and atraumatic.  Eyes:     General:        Right eye: No discharge.        Left eye: No discharge.  Cardiovascular:     Rate and Rhythm: Regular rhythm. Bradycardia present.     Heart sounds: Normal heart sounds, S1 normal and S2 normal. Heart sounds not distant. No midsystolic click and no opening snap. No murmur heard.    No friction rub.  Pulmonary:     Effort: Pulmonary effort is normal. No respiratory distress.     Breath sounds: Normal breath sounds. No decreased breath sounds, wheezing, rhonchi or rales.  Musculoskeletal:     Cervical back: Normal range of motion.  Skin:    General: Skin is warm and dry.     Nails: There is no clubbing.  Neurological:     Mental Status: He is alert  and oriented to person, place, and time.  Psychiatric:        Speech: Speech normal.        Behavior: Behavior normal.        Thought Content: Thought content normal.        Judgment: Judgment normal.     Wt Readings from Last 3 Encounters:  08/26/24 215 lb (97.5 kg)  06/10/24 213 lb (96.6 kg)  03/16/24 204 lb 3.2 oz (92.6 kg)     ASSESSMENT & PLAN:   CAD involving the native coronary arteries with stable angina: He is doing well and without symptoms concerning for angina or cardiac decompensation.  Continue DAPT with aspirin  81 mg and clopidogrel  75 mg with interruption in DAPT therapy in the perioperative timeframe as outlined below after discussion with interventional cardiology.  He will otherwise continue atorvastatin  80 mg and as needed SL NTG.  No indication for further ischemic testing at this time.  Frequent PVCs: Burden much improved on Toprol -XL 25 mg.  Dilated ascending thoracic aorta: Aorta documented to be normal in size by coronary CTA in 12/2023.  Maintain optimal blood pressure control.  Preoperative cardiac restratification: He is scheduled to undergo arthroscopic knee surgery with lateral meniscectomy on 09/02/2024.  Case has been discussed with primary cardiologist with recommendation to discontinue clopidogrel  5 days prior to surgery  with continuation of aspirin  81 mg daily throughout the entire perioperative timeframe and to resume clopidogrel  as soon as safely possible in the postoperative timeframe.  Per [facility] Activity Status Index, he can achieve > 4 METs.  Per Revised Cardiac Risk Index, he is low risk for noncardiac surgery with an estimated rate of 0.9% for adverse cardiac event in the periprocedural timeframe.  No further cardiac testing is indicated at this time.  HTN: Blood pressure is well-controlled in the office today and has been well-controlled at home.  Remains on Toprol -XL 25 mg daily.  HLD with statin intolerance: LDL 25 in 06/2024.  Reduce Repatha to  140 mg injected every 30 days.  Follow-up fasting lipid panel in 3 months.     Disposition: F/u with Dr. Darron or an APP in 6 months.   Medication Adjustments/Labs and Tests Ordered: Current medicines are reviewed at length with the patient today.  Concerns regarding medicines are outlined above. Medication changes, Labs and Tests ordered today are summarized above and listed in the Patient Instructions accessible in Encounters.   Signed, Bernardino Bring, PA-C 08/26/2024 4:41 PM     [facility] [place] [address] [place], [place] [address] [phone]

## 2024-08-26 ENCOUNTER — Encounter: Payer: Self-pay | Admitting: Physician Assistant

## 2024-08-26 ENCOUNTER — Ambulatory Visit: Payer: Self-pay | Attending: Physician Assistant | Admitting: Physician Assistant

## 2024-08-26 VITALS — BP 118/62 | HR 53 | Ht 72.0 in | Wt 215.0 lb

## 2024-08-26 DIAGNOSIS — I25118 Atherosclerotic heart disease of native coronary artery with other forms of angina pectoris: Secondary | ICD-10-CM

## 2024-08-26 DIAGNOSIS — E785 Hyperlipidemia, unspecified: Secondary | ICD-10-CM

## 2024-08-26 DIAGNOSIS — I77810 Thoracic aortic ectasia: Secondary | ICD-10-CM

## 2024-08-26 DIAGNOSIS — Z955 Presence of coronary angioplasty implant and graft: Secondary | ICD-10-CM

## 2024-08-26 DIAGNOSIS — I493 Ventricular premature depolarization: Secondary | ICD-10-CM

## 2024-08-26 DIAGNOSIS — Z01810 Encounter for preprocedural cardiovascular examination: Secondary | ICD-10-CM

## 2024-08-26 DIAGNOSIS — Z789 Other specified health status: Secondary | ICD-10-CM

## 2024-08-26 DIAGNOSIS — I10 Essential (primary) hypertension: Secondary | ICD-10-CM

## 2024-08-26 MED ORDER — REPATHA SURECLICK 140 MG/ML ~~LOC~~ SOAJ: 140.0000 mg | SUBCUTANEOUS | 1 refills | Status: AC

## 2024-08-26 NOTE — Patient Instructions (Signed)
 Medication Instructions:  Your physician recommends the following medication changes.  DECREASE: Repatha  140 mg injection monthly  *If you need a refill on your cardiac medications before your next appointment, please call your pharmacy*  Lab Work: None ordered at this time   Follow-Up: At [facility], you and your health needs are our priority.  As part of our continuing mission to provide you with exceptional heart care, our providers are all part of one team.  This team includes your primary Cardiologist (physician) and Advanced Practice Providers or APPs (Physician Assistants and Nurse Practitioners) who all work together to provide you with the care you need, when you need it.  Your next appointment:   6 month(s)  Provider:   You may see Deatrice Cage, MD or Bernardino Bring, PA-C

## 2024-08-27 ENCOUNTER — Other Ambulatory Visit: Payer: Self-pay

## 2024-08-27 ENCOUNTER — Encounter (HOSPITAL_BASED_OUTPATIENT_CLINIC_OR_DEPARTMENT_OTHER): Payer: Self-pay | Admitting: Orthopedic Surgery

## 2024-09-02 ENCOUNTER — Ambulatory Visit (HOSPITAL_BASED_OUTPATIENT_CLINIC_OR_DEPARTMENT_OTHER)
Admission: RE | Admit: 2024-09-02 | Discharge: 2024-09-02 | Disposition: A | Discharge status: Home / Self Care | Attending: Orthopedic Surgery | Admitting: Orthopedic Surgery

## 2024-09-02 ENCOUNTER — Other Ambulatory Visit: Payer: Self-pay

## 2024-09-02 ENCOUNTER — Ambulatory Visit (HOSPITAL_BASED_OUTPATIENT_CLINIC_OR_DEPARTMENT_OTHER): Admitting: Anesthesiology

## 2024-09-02 ENCOUNTER — Encounter (HOSPITAL_BASED_OUTPATIENT_CLINIC_OR_DEPARTMENT_OTHER)
Admission: RE | Disposition: A | Discharge status: Home / Self Care | Payer: Self-pay | Source: Home / Self Care | Attending: Orthopedic Surgery

## 2024-09-02 ENCOUNTER — Encounter (HOSPITAL_BASED_OUTPATIENT_CLINIC_OR_DEPARTMENT_OTHER): Payer: Self-pay | Admitting: Orthopedic Surgery

## 2024-09-02 DIAGNOSIS — M1990 Unspecified osteoarthritis, unspecified site: Secondary | ICD-10-CM | POA: Insufficient documentation

## 2024-09-02 DIAGNOSIS — Z01818 Encounter for other preprocedural examination: Secondary | ICD-10-CM

## 2024-09-02 DIAGNOSIS — Z7982 Long term (current) use of aspirin: Secondary | ICD-10-CM | POA: Insufficient documentation

## 2024-09-02 DIAGNOSIS — X58XXXA Exposure to other specified factors, initial encounter: Secondary | ICD-10-CM | POA: Insufficient documentation

## 2024-09-02 DIAGNOSIS — E785 Hyperlipidemia, unspecified: Secondary | ICD-10-CM | POA: Insufficient documentation

## 2024-09-02 DIAGNOSIS — M23001 Cystic meniscus, unspecified lateral meniscus, left knee: Secondary | ICD-10-CM | POA: Insufficient documentation

## 2024-09-02 DIAGNOSIS — S83282A Other tear of lateral meniscus, current injury, left knee, initial encounter: Secondary | ICD-10-CM | POA: Insufficient documentation

## 2024-09-02 DIAGNOSIS — I10 Essential (primary) hypertension: Secondary | ICD-10-CM | POA: Insufficient documentation

## 2024-09-02 DIAGNOSIS — Z955 Presence of coronary angioplasty implant and graft: Secondary | ICD-10-CM | POA: Insufficient documentation

## 2024-09-02 DIAGNOSIS — I2510 Atherosclerotic heart disease of native coronary artery without angina pectoris: Secondary | ICD-10-CM | POA: Insufficient documentation

## 2024-09-02 DIAGNOSIS — E039 Hypothyroidism, unspecified: Secondary | ICD-10-CM | POA: Insufficient documentation

## 2024-09-02 DIAGNOSIS — Z7902 Long term (current) use of antithrombotics/antiplatelets: Secondary | ICD-10-CM | POA: Insufficient documentation

## 2024-09-02 DIAGNOSIS — S83262A Peripheral tear of lateral meniscus, current injury, left knee, initial encounter: Secondary | ICD-10-CM

## 2024-09-02 DIAGNOSIS — Z79890 Hormone replacement therapy (postmenopausal): Secondary | ICD-10-CM | POA: Insufficient documentation

## 2024-09-02 DIAGNOSIS — R519 Headache, unspecified: Secondary | ICD-10-CM | POA: Insufficient documentation

## 2024-09-02 DIAGNOSIS — M94262 Chondromalacia, left knee: Secondary | ICD-10-CM | POA: Insufficient documentation

## 2024-09-02 DIAGNOSIS — M23007 Cystic meniscus, unspecified meniscus, left knee: Secondary | ICD-10-CM | POA: Diagnosis present

## 2024-09-02 DIAGNOSIS — Z79899 Other long term (current) drug therapy: Secondary | ICD-10-CM | POA: Insufficient documentation

## 2024-09-02 HISTORY — PX: EXCISION, LESION, MENISCUS: SHX7565

## 2024-09-02 HISTORY — PX: KNEE ARTHROSCOPY WITH LATERAL MENISECTOMY: SHX6193

## 2024-09-02 SURGERY — ARTHROSCOPY, KNEE, WITH LATERAL MENISCECTOMY
Anesthesia: General | Site: Knee | Laterality: Left

## 2024-09-02 MED ORDER — MIDAZOLAM HCL 2 MG/2ML IJ SOLN
INTRAMUSCULAR | Status: AC
Start: 2024-09-02 — End: 2024-09-02
  Filled 2024-09-02: qty 2

## 2024-09-02 MED ORDER — PHENYLEPHRINE HCL (PRESSORS) 10 MG/ML IV SOLN
INTRAVENOUS | Status: DC | PRN
  Administered 2024-09-02 (×2): 80 ug via INTRAVENOUS

## 2024-09-02 MED ORDER — CEFAZOLIN SODIUM-DEXTROSE 2-4 GM/100ML-% IV SOLN
INTRAVENOUS | Status: AC
Start: 2024-09-02 — End: 2024-09-02
  Filled 2024-09-02: qty 100

## 2024-09-02 MED ORDER — OXYCODONE HCL 5 MG/5ML PO SOLN: 5.0000 mg | Freq: Once | ORAL | Status: AC | PRN

## 2024-09-02 MED ORDER — FENTANYL CITRATE (PF) 100 MCG/2ML IJ SOLN
INTRAMUSCULAR | Status: AC
  Filled 2024-09-02: qty 2

## 2024-09-02 MED ORDER — OXYCODONE HCL 5 MG PO TABS
ORAL_TABLET | ORAL | Status: AC
  Filled 2024-09-02: qty 1

## 2024-09-02 MED ORDER — FENTANYL CITRATE (PF) 100 MCG/2ML IJ SOLN
INTRAMUSCULAR | Status: DC | PRN
  Administered 2024-09-02 (×4): 50 ug via INTRAVENOUS

## 2024-09-02 MED ORDER — AMISULPRIDE (ANTIEMETIC) 5 MG/2ML IV SOLN
10.0000 mg | Freq: Once | INTRAVENOUS | Status: AC
  Administered 2024-09-02: 10 mg via INTRAVENOUS

## 2024-09-02 MED ORDER — EPHEDRINE SULFATE (PRESSORS) 25 MG/5ML IV SOSY
PREFILLED_SYRINGE | INTRAVENOUS | Status: DC | PRN
  Administered 2024-09-02: 5 mg via INTRAVENOUS
  Administered 2024-09-02: 10 mg via INTRAVENOUS

## 2024-09-02 MED ORDER — MIDAZOLAM HCL 5 MG/5ML IJ SOLN
INTRAMUSCULAR | Status: DC | PRN
  Administered 2024-09-02: 2 mg via INTRAVENOUS

## 2024-09-02 MED ORDER — FENTANYL CITRATE (PF) 100 MCG/2ML IJ SOLN
25.0000 ug | INTRAMUSCULAR | Status: DC | PRN
  Administered 2024-09-02 (×3): 50 ug via INTRAVENOUS

## 2024-09-02 MED ORDER — PROPOFOL 10 MG/ML IV BOLUS
INTRAVENOUS | Status: DC | PRN
  Administered 2024-09-02: 200 mg via INTRAVENOUS

## 2024-09-02 MED ORDER — ACETAMINOPHEN 10 MG/ML IV SOLN
1000.0000 mg | Freq: Once | INTRAVENOUS | Status: DC | PRN
  Administered 2024-09-02: 1000 mg via INTRAVENOUS

## 2024-09-02 MED ORDER — LACTATED RINGERS IV SOLN: INTRAVENOUS | Status: DC

## 2024-09-02 MED ORDER — ONDANSETRON HCL 4 MG/2ML IJ SOLN
INTRAMUSCULAR | Status: DC | PRN
  Administered 2024-09-02: 4 mg via INTRAVENOUS

## 2024-09-02 MED ORDER — EPHEDRINE 5 MG/ML INJ
INTRAVENOUS | Status: AC
Start: 2024-09-02 — End: 2024-09-02
  Filled 2024-09-02: qty 5

## 2024-09-02 MED ORDER — ONDANSETRON HCL 4 MG/2ML IJ SOLN
INTRAMUSCULAR | Status: AC
  Filled 2024-09-02: qty 2

## 2024-09-02 MED ORDER — CEFAZOLIN SODIUM-DEXTROSE 2-4 GM/100ML-% IV SOLN
2.0000 g | INTRAVENOUS | Status: AC
  Administered 2024-09-02: 2 g via INTRAVENOUS

## 2024-09-02 MED ORDER — PHENYLEPHRINE 80 MCG/ML (10ML) SYRINGE FOR IV PUSH (FOR BLOOD PRESSURE SUPPORT)
PREFILLED_SYRINGE | INTRAVENOUS | Status: AC
Start: 2024-09-02 — End: 2024-09-02
  Filled 2024-09-02: qty 10

## 2024-09-02 MED ORDER — SODIUM CHLORIDE 0.9 % IR SOLN
Status: DC | PRN
  Administered 2024-09-02: 2 mL

## 2024-09-02 MED ORDER — ACETAMINOPHEN 10 MG/ML IV SOLN
INTRAVENOUS | Status: AC
  Filled 2024-09-02: qty 100

## 2024-09-02 MED ORDER — LIDOCAINE HCL (CARDIAC) PF 100 MG/5ML IV SOSY
PREFILLED_SYRINGE | INTRAVENOUS | Status: DC | PRN
  Administered 2024-09-02: 100 mg via INTRAVENOUS

## 2024-09-02 MED ORDER — AMISULPRIDE (ANTIEMETIC) 5 MG/2ML IV SOLN
INTRAVENOUS | Status: AC
  Filled 2024-09-02: qty 4

## 2024-09-02 MED ORDER — DEXAMETHASONE SODIUM PHOSPHATE 4 MG/ML IJ SOLN
INTRAMUSCULAR | Status: DC | PRN
  Administered 2024-09-02: 5 mg via INTRAVENOUS

## 2024-09-02 MED ORDER — OXYCODONE HCL 5 MG PO TABS
5.0000 mg | ORAL_TABLET | Freq: Once | ORAL | Status: AC | PRN
  Administered 2024-09-02: 5 mg via ORAL

## 2024-09-02 MED ORDER — LIDOCAINE 2% (20 MG/ML) 5 ML SYRINGE
INTRAMUSCULAR | Status: AC
  Filled 2024-09-02: qty 5

## 2024-09-02 MED ORDER — ONDANSETRON HCL 4 MG/2ML IJ SOLN: 4.0000 mg | Freq: Once | INTRAMUSCULAR | Status: DC | PRN

## 2024-09-02 MED ORDER — HYDROCODONE-ACETAMINOPHEN 5-325 MG PO TABS: 1.0000 | ORAL_TABLET | Freq: Four times a day (QID) | ORAL | 0 refills | Status: AC | PRN

## 2024-09-02 MED ORDER — PROPOFOL 10 MG/ML IV BOLUS
INTRAVENOUS | Status: AC
  Filled 2024-09-02: qty 20

## 2024-09-02 SURGICAL SUPPLY — 36 items
BENZOIN TINCTURE PRP APPL 2/3 (GAUZE/BANDAGES/DRESSINGS) IMPLANT
BLADE EXCALIBUR 4.0X13 (MISCELLANEOUS) ×1 IMPLANT
BNDG ELASTIC 6INX 5YD STR LF (GAUZE/BANDAGES/DRESSINGS) ×1 IMPLANT
DISSECTOR 4.0MM X 13CM (MISCELLANEOUS) IMPLANT
DRAPE ARTHROSCOPY W/POUCH 90 (DRAPES) ×1 IMPLANT
DRSG EMULSION OIL 3X3 NADH (GAUZE/BANDAGES/DRESSINGS) ×1 IMPLANT
DRSG TEGADERM 2-3/8X2-3/4 SM (GAUZE/BANDAGES/DRESSINGS) IMPLANT
DURAPREP 26ML APPLICATOR (WOUND CARE) ×1 IMPLANT
ELECTRODE REM PT RTRN 9FT ADLT (ELECTROSURGICAL) IMPLANT
GAUZE SPONGE 4X4 12PLY STRL (GAUZE/BANDAGES/DRESSINGS) ×1 IMPLANT
GLOVE BIOGEL PI IND STRL 8 (GLOVE) ×2 IMPLANT
GLOVE ECLIPSE 7.5 STRL STRAW (GLOVE) ×2 IMPLANT
GOWN STRL REUS W/ TWL LRG LVL3 (GOWN DISPOSABLE) ×1 IMPLANT
GOWN STRL REUS W/ TWL XL LVL3 (GOWN DISPOSABLE) ×1 IMPLANT
GOWN STRL REUS W/TWL XL LVL3 (GOWN DISPOSABLE) ×1 IMPLANT
MANIFOLD NEPTUNE II (INSTRUMENTS) IMPLANT
NDL SAFETY ECLIPSE 18X1.5 (NEEDLE) IMPLANT
PACK ARTHROSCOPY DSU (CUSTOM PROCEDURE TRAY) ×1 IMPLANT
PACK BASIN DAY SURGERY FS (CUSTOM PROCEDURE TRAY) ×1 IMPLANT
PAD CAST 4YDX4 CTTN HI CHSV (CAST SUPPLIES) ×1 IMPLANT
PENCIL SMOKE EVACUATOR (MISCELLANEOUS) IMPLANT
POSITIONER KNEE ARTHROSCOPIC (MISCELLANEOUS) ×1 IMPLANT
SOL .9 NS 3000ML IRR UROMATIC (IV SOLUTION) ×2 IMPLANT
SOLN STERILE WATER BTL 1000 ML (IV SOLUTION) ×1 IMPLANT
SPONGE T-LAP 4X18 ~~LOC~~+RFID (SPONGE) IMPLANT
STRIP CLOSURE SKIN 1/2X4 (GAUZE/BANDAGES/DRESSINGS) IMPLANT
SUT ETHILON 4 0 PS 2 18 (SUTURE) IMPLANT
SUT MNCRL AB 4-0 PS2 18 (SUTURE) IMPLANT
SUT VIC AB 0 SH 27 (SUTURE) IMPLANT
SUT VIC AB 2-0 SH 27XBRD (SUTURE) IMPLANT
SUT VIC AB 3-0 SH 27X BRD (SUTURE) IMPLANT
SYR 5ML LL (SYRINGE) ×1 IMPLANT
TOWEL GREEN STERILE FF (TOWEL DISPOSABLE) ×1 IMPLANT
TUBING ARTHROSCOPY IRRIG 16FT (MISCELLANEOUS) ×1 IMPLANT
WAND ABLATOR APOLLO I90 (BUR) IMPLANT
WRAP KNEE MAXI GEL POST OP (GAUZE/BANDAGES/DRESSINGS) ×1 IMPLANT

## 2024-09-02 NOTE — Discharge Instructions (Addendum)
 Post Anesthesia Home Care Instructions  Activity: Get plenty of rest for the remainder of the day. A responsible individual must stay with you for 24 hours following the procedure.  For the next 24 hours, DO NOT: -Drive a car -Advertising copywriter -Drink alcoholic beverages -Take any medication unless instructed by your physician -Make any legal decisions or sign important papers.  Meals: Start with liquid foods such as gelatin or soup. Progress to regular foods as tolerated. Avoid greasy, spicy, heavy foods. If nausea and/or vomiting occur, drink only clear liquids until the nausea and/or vomiting subsides. Call your physician if vomiting continues.  Special Instructions/Symptoms: Your throat may feel dry or sore from the anesthesia or the breathing tube placed in your throat during surgery. If this causes discomfort, gargle with warm salt water. The discomfort should disappear within 24 hours.  If you had a scopolamine patch placed behind your ear for the management of post- operative nausea and/or vomiting:  1. The medication in the patch is effective for 72 hours, after which it should be removed.  Wrap patch in a tissue and discard in the trash. Wash hands thoroughly with soap and water. 2. You may remove the patch earlier than 72 hours if you experience unpleasant side effects which may include dry mouth, dizziness or visual disturbances. 3. Avoid touching the patch. Wash your hands with soap and water after contact with the patch.   POST-OP KNEE ARTHROSCOPY INSTRUCTIONS  Dr. Norleen Inga Reining PA-C  Keep your lateral knee incision dry.  You can get a large Band-Aid over the incision to shower and small Band-Aids over the arthroscopic incisions to shower.  Then rewrap your knee with an Ace bandage.  You may need to put a small gauze over the lateral knee incision.  If you would like, you can wrap your knee with press and seal Saran wrap and tape the top to shower. Pain You will  be expected to have a moderate amount of pain in the affected knee for approximately two weeks. However, the first two days will be the most severe pain. A prescription has been provided to take as needed for the pain. The pain can be reduced by applying ice packs to the knee for the first 1-2 weeks post surgery. Also, keeping the leg elevated on pillows will help alleviate the pain. If you develop any acute pain or swelling in your calf muscle, please call the doctor.  Activity It is preferred that you stay at bed rest for approximately 24 hours. However, you may go to the bathroom with help. Weight bearing as tolerated. You may begin the knee exercises the day of surgery. Discontinue crutches as the knee pain resolves.  Dressing Keep the dressing dry. If the ace bandage should wrinkle or roll up, this can be rewrapped to prevent ridges in the bandage. You may remove all dressings in 48 hours,  apply bandaids to each wound. You may shower on the 4th day after surgery but no tub bath.  Symptoms to report to your doctor Extreme pain Extreme swelling Temperature above 101 degrees Change in the feeling, color, or movement of your toes Redness, heat, or swelling at your incision  Exercise If is preferred that as soon as possible you try to do a straight leg raise without bending the knee and concentrate on bringing the heel of your foot off the bed up to approximately 45 degrees and hold for the count of 10 seconds. Repeat this at least 10 times  three or four times per day. Additional exercises are provided below.  You are encouraged to bend the knee as tolerated.  Follow-Up Call to schedule a follow-up appointment in 5-7 days. Our office # is [phone].  POST-OP EXERCISES  Short Arc Quads  1. Lie on back with legs straight. Place towel roll under thigh, just above knee. 2. Tighten thigh muscles to straighten knee and lift heel off bed. 3. Hold for slow count of five, then lower. 4. Do three  sets of ten    Straight Leg Raises  1. Lie on back with operative leg straight and other leg bent. 2. Keeping operative leg completely straight, slowly lift operative leg so foot is 5 inches off bed. 3. Hold for slow count of five, then lower. 4. Do three sets of ten.    DO BOTH EXERCISES 2 TIMES A DAY  Ankle Pumps  Work/move the operative ankle and foot up and down 10 times every hour while awake.

## 2024-09-02 NOTE — Anesthesia Preprocedure Evaluation (Addendum)
 Anesthesia Evaluation  Patient identified by MRN, date of birth, ID band Patient awake    Reviewed: Allergy & Precautions, NPO status , Patient's Chart, lab work & pertinent test results, reviewed documented beta blocker date and time   History of Anesthesia Complications Negative for: history of anesthetic complications  Airway Mallampati: III  TM Distance: >3 FB     Dental no notable dental hx.    Pulmonary neg COPD   breath sounds clear to auscultation       Cardiovascular hypertension, (-) angina + CAD and + Cardiac Stents  (-) Past MI, (-) CHF and (-) Orthopnea  Rhythm:Regular Rate:Normal  Dist LAD lesion is 40% stenosed.   Prox RCA lesion is 20% stenosed.   1st Sept lesion is 90% stenosed.   RPAV lesion is 30% stenosed.   RPDA lesion is 30% stenosed.   Ramus-2 lesion is 99% stenosed.   Ramus-1 lesion is 95% stenosed.   Ramus-3 lesion is 60% stenosed.   Non-stenotic Prox LAD to Mid LAD lesion was previously treated.   A drug-eluting stent was successfully placed using a STENT SYNERGY XD 2.25X28.   A drug-eluting stent was successfully placed using a STENT SYNERGY XD 2.50X20.   Post intervention, there is a 0% residual stenosis.   Post intervention, there is a 0% residual stenosis.   The left ventricular systolic function is normal.   LV end diastolic pressure is mildly elevated.   The left ventricular ejection fraction is 55-65% by visual estimate.   In the absence of any other complications or medical issues, we expect the patient to be ready for discharge from an interventional cardiology perspective on 02/26/2024.   Recommend dual antiplatelet therapy with Aspirin  81mg  daily and Clopidogrel  75mg  daily.   IMPRESSIONS     1. Left ventricular ejection fraction, by estimation, is 55 to 60%. The  left ventricle has normal function. The left ventricle has no regional  wall motion abnormalities. Left  ventricular diastolic parameters were  normal. The average left ventricular  global longitudinal strain is -17.7 %. The global longitudinal strain is  normal.   2. Right ventricular systolic function is normal. The right ventricular  size is normal.   3. The mitral valve is normal in structure. Mild mitral valve  regurgitation. No evidence of mitral stenosis.   4. The aortic valve is normal in structure. Aortic valve regurgitation is  not visualized. No aortic stenosis is present.   5. The inferior vena cava is normal in size with greater than 50%  respiratory variability, suggesting right atrial pressure of 3 mmHg.      Neuro/Psych  Headaches, neg Seizures    GI/Hepatic   Endo/Other  Hypothyroidism    Renal/GU      Musculoskeletal  (+) Arthritis , Osteoarthritis,    Abdominal   Peds  Hematology   Anesthesia Other Findings   Reproductive/Obstetrics                              Anesthesia Physical Anesthesia Plan  ASA: 2  Anesthesia Plan: General   Post-op Pain Management:    Induction: Intravenous  PONV Risk Score and Plan: 2 and Ondansetron  and Dexamethasone   Airway Management Planned: LMA  Additional Equipment:   Intra-op Plan:   Post-operative Plan: Extubation in OR  Informed Consent: I have reviewed the patients History and Physical, chart, labs and discussed the procedure including the risks, benefits and alternatives for the  proposed anesthesia with the patient or authorized representative who has indicated his/her understanding and acceptance.     Dental advisory given  Plan Discussed with: CRNA  Anesthesia Plan Comments:          Anesthesia Quick Evaluation

## 2024-09-02 NOTE — Progress Notes (Signed)
 Last time received oxycodone  and tylenol  written on dc paperwork.

## 2024-09-02 NOTE — Anesthesia Procedure Notes (Signed)
 Procedure Name: LMA Insertion Date/Time: 09/02/2024 11:23 AM  Performed by: Pam Macario BROCKS, CRNAPre-anesthesia Checklist: Patient identified, Emergency Drugs available, Suction available, Patient being monitored and Timeout performed Patient Re-evaluated:Patient Re-evaluated prior to induction Oxygen Delivery Method: Circle system utilized Preoxygenation: Pre-oxygenation with 100% oxygen Induction Type: IV induction Ventilation: Mask ventilation without difficulty LMA: LMA inserted LMA Size: 4.0 Number of attempts: 1 Airway Equipment and Method: Bite block Placement Confirmation: positive ETCO2, breath sounds checked- equal and bilateral and CO2 detector Tube secured with: Tape Dental Injury: Teeth and Oropharynx as per pre-operative assessment

## 2024-09-02 NOTE — Anesthesia Postprocedure Evaluation (Signed)
 Anesthesia Post Note  Patient: Chase Jensen  Procedure(s) Performed: ARTHROSCOPY, KNEE, WITH PARTIAL LATERAL MENISCECTOMY (Left: Knee) EXCISION, LESION, MENISCUS (Left: Knee)     Patient location during evaluation: Phase II Anesthesia Type: General Level of consciousness: awake Pain management: pain level controlled Vital Signs Assessment: post-procedure vital signs reviewed and stable Respiratory status: spontaneous breathing Cardiovascular status: blood pressure returned to baseline Postop Assessment: no apparent nausea or vomiting Anesthetic complications: no   No notable events documented.  Last Vitals:  Vitals:   09/02/24 1345 09/02/24 1409  BP: 133/82 131/81  Pulse: 69 (!) 52  Resp: 13 16  Temp:  36.6 C  SpO2: 98% 97%    Last Pain:  Vitals:   09/02/24 1409  TempSrc:   PainSc: 4                  Lauraine KATHEE [place]

## 2024-09-02 NOTE — Transfer of Care (Signed)
 Immediate Anesthesia Transfer of Care Note  Patient: Chase Jensen  Procedure(s) Performed: ARTHROSCOPY, KNEE, WITH PARTIAL LATERAL MENISCECTOMY (Left: Knee) EXCISION, LESION, MENISCUS (Left: Knee)  Patient Location: PACU  Anesthesia Type:General  Level of Consciousness: awake and alert   Airway & Oxygen Therapy: Patient Spontanous Breathing and Patient connected to face mask oxygen  Post-op Assessment: Report given to RN  Post vital signs: Reviewed and stable  Last Vitals:  Vitals Value Taken Time  BP 138/73 09/02/24 12:45  Temp    Pulse 74 09/02/24 12:47  Resp 14 09/02/24 12:47  SpO2 100 % 09/02/24 12:47  Vitals shown include unfiled device data.  Last Pain:  Vitals:   09/02/24 0817  TempSrc: Temporal  PainSc: 0-No pain         Complications: No notable events documented.

## 2024-09-02 NOTE — H&P (Signed)
 A pre op hand p   Chief Complaint: l knee pain  HPI: Chase Jensen is a 58 y.o. male who presents for evaluation of l knee . It has been present for greater than 3 monthsk and has been worsening. He has failed conservative measures. Pain is rated as moderate.  Past Medical History:  Diagnosis Date   Coronary artery disease 2006    balloon angioplasty without stent placement to one-vessel done at [facility] in [place]     Hyperlipemia    Hypertension    Hypothyroidism    Past Surgical History:  Procedure Laterality Date   CARDIAC SURGERY     CHOLECYSTECTOMY     COLONOSCOPY WITH PROPOFOL  N/A 07/29/2015   Procedure: COLONOSCOPY WITH PROPOFOL ;  Surgeon: Donnice Vaughn Manes, MD;  [facility];  Service: Endoscopy;  Laterality: N/A;   CORONARY ANGIOPLASTY     CORONARY STENT INTERVENTION N/A 12/18/2023   Procedure: CORONARY STENT INTERVENTION;  Surgeon: Darron Deatrice LABOR, MD;  Location: [facility] INVASIVE CV LAB;  Service: Cardiovascular;  Laterality: N/A;  prox LAD   CORONARY STENT INTERVENTION N/A 02/26/2024   Procedure: CORONARY STENT INTERVENTION;  Surgeon: Darron Deatrice LABOR, MD;  Location: [facility] INVASIVE CV LAB;  Service: Cardiovascular;  Laterality: N/A;   HERNIA REPAIR     LEFT HEART CATH AND CORONARY ANGIOGRAPHY N/A 12/18/2023   Procedure: LEFT HEART CATH AND CORONARY ANGIOGRAPHY;  Surgeon: Darron Deatrice LABOR, MD;  Location: [facility] INVASIVE CV LAB;  Service: Cardiovascular;  Laterality: N/A;   LEFT HEART CATH AND CORONARY ANGIOGRAPHY N/A 02/26/2024   Procedure: LEFT HEART CATH AND CORONARY ANGIOGRAPHY;  Surgeon: Darron Deatrice LABOR, MD;  Location: [facility] INVASIVE CV LAB;  Service: Cardiovascular;  Laterality: N/A;   NASAL SINUS SURGERY     PILONIDAL CYST DRAINAGE     TONSILLECTOMY     Social History   Socioeconomic History   Marital status: Married    Spouse name: Not on file   Number of children: Not on file   Years of education: Not on file   Highest education level:  Not on file  Occupational History   Not on file  Tobacco Use   Smoking status: Never   Smokeless tobacco: Never  Vaping Use   Vaping status: Never Used  Substance and Sexual Activity   Alcohol use: Never   Drug use: Never   Sexual activity: Not on file  Other Topics Concern   Not on file  Social History Narrative   Not on file   Social Drivers of Health   Financial Resource Strain: Low Risk  (03/26/2024)   Overall Financial Resource Strain (CARDIA)    Difficulty of Paying Living Expenses: Not very hard  Food Insecurity: No Food Insecurity (03/26/2024)   Hunger Vital Sign    Worried About Running Out of Food in the Last Year: Never true    Ran Out of Food in the Last Year: Never true  Transportation Needs: No Transportation Needs (03/26/2024)   PRAPARE - Administrator, Civil Service (Medical): No    Lack of Transportation (Non-Medical): No  Physical Activity: Not on file  Stress: Not on file  Social Connections: Not on file   History reviewed. No pertinent family history. Allergies  Allergen Reactions   Imdur  [Isosorbide  Nitrate]     Headache    Zetia  [Ezetimibe ]     Leg cramps   Penicillins Rash   Propoxyphene Rash    Darvocet N Acetaminophen    Prior to  Admission medications   Medication Sig Start Date End Date Taking? Authorizing Provider  Ascorbic Acid (VITAMIN C PO) Take 1,000 mg by mouth daily.   Yes [provider]  aspirin  EC 81 MG tablet Take 1 tablet (81 mg total) by mouth daily. Swallow whole. 05/13/24  Yes Darron Deatrice LABOR, MD  clopidogrel  (PLAVIX ) 75 MG tablet Take 1 tablet (75 mg total) by mouth daily. 05/13/24  Yes Darron Deatrice LABOR, MD  Evolocumab  (REPATHA  SURECLICK) 140 MG/ML SOAJ Inject 140 mg into the skin every 30 (thirty) days. 08/26/24  Yes Dunn, Bernardino HERO, PA-C  levothyroxine  (SYNTHROID ) 150 MCG tablet Take 150 mcg by mouth daily before breakfast. 12/13/23 12/12/24 Yes [provider]  metoprolol  succinate (TOPROL  XL) 25 MG 24  hr tablet Take 1 tablet (25 mg total) by mouth at bedtime. 03/16/24  Yes Dunn, Ryan M, PA-C  OVER THE COUNTER MEDICATION Take 1 packet by mouth daily. Multivitamin Packet - Multivitamin, Fish Oil, Omega Blend, Turmeric, Coq10, Vit K2, Boron   Yes [provider]  fluticasone  (FLONASE ) 50 MCG/ACT nasal spray Place 1 spray into the nose daily as needed for allergies. 05/16/21   [provider]  nitroGLYCERIN  (NITROSTAT ) 0.4 MG SL tablet Place 1 tablet (0.4 mg total) under the tongue every 5 (five) minutes as needed for chest pain. 12/18/23 12/17/24  Goodrich, Callie E, PA-C  SUMAtriptan  (IMITREX ) 100 MG tablet Take 100 mg by mouth every 2 (two) hours as needed for migraine. 03/28/23   [provider]     Positive ROS: none  All other systems have been reviewed and were otherwise negative with the exception of those mentioned in the HPI and as above.  Physical Exam: Vitals:   09/02/24 0817  BP: (!) 142/79  Pulse: (!) 58  Resp: 16  Temp: 98 F (36.7 C)  SpO2: 100%    General: Alert, no acute distress Cardiovascular: No pedal edema Respiratory: No cyanosis, no use of accessory musculature GI: No organomegaly, abdomen is soft and non-tender Skin: No lesions in the area of chief complaint Neurologic: Sensation intact distally Psychiatric: Patient is competent for consent with normal mood and affect Lymphatic: No axillary or cervical lymphadenopathy  MUSCULOSKELETAL: l knee: +lat jt line pain  Assessment/Plan: LEFT KNEE LATERAL MENISCUS TEAR AND PARA MENISCAL CYST Plan for Procedure(s): ARTHROSCOPY, KNEE, WITH LATERAL MENISCECTOMY EXCISION, LESION, MENISCUS  The risks benefits and alternatives were discussed with the patient including but not limited to the risks of nonoperative treatment, versus surgical intervention including infection, bleeding, nerve injury, malunion, nonunion, hardware prominence, hardware failure, need for hardware removal, blood clots,  cardiopulmonary complications, morbidity, mortality, among others, and they were willing to proceed.  Predicted outcome is good, although there will be at least a six to nine month expected recovery.  Norleen LITTIE Gavel, MD 09/02/2024 11:06 AM

## 2024-09-02 NOTE — Op Note (Signed)
 Pre-Op Dx: 1.lateral meniscal tear left knee 2.parameniscal cyst lateral side left knee  Postop Dx: 1.same as above 2.chondromalacia of the medial femoral condyle, lateral femoral condyle, and lateral tibial plateau  Procedure: 1.partial lateral meniscectomy.  2.abrasion arthroplasty medial femoral condyle of an area 2 x 2 cm, lateral femoral condyle of an area 2 x 2 cm, and lateral tibial plateau area 1 x 2 cm--down to bleeding bone where necessary 3.open excision of parameniscal cyst  Surgeon: Norleen Gavel M.D.  Assist: Signe Reining PA-C  (present throughout entire procedure and necessary for timely completion of the procedure) Anes: General LMA  EBL: Minimal  Fluids: 800 cc   Procedure: Patient identified by arm band and taken to the operating room at the day surgery Center. The appropriate anesthetic monitors were attached, and General LMA anesthesia was induced without difficulty.  The left leg was then prepped and draped in the usual sterile fashion.  Following this the lateral meniscal cyst was identified and the leg was exsanguinated and a blood pressure tourniquet was inflated to 250 mmHg.  An incision was made in line with the IT band on the lateral aspect of the left knee.  Dissection carried down to the level of the lateral IT band.  IT band was divided in line with its fibers.  Retractors were put in place giving access to the cyst below.  The cyst was identified and carefully dissected free of the capsule.  Is removed in total and a picture was taken of the excised cyst.  Rondure was used to remove portions of the remaining cyst wall off of the joint capsule.  Electrocautery was used to cauterize the area of the joint capsule and remaining tissue for any cystic wall mass that was left.  The wound was irrigated extensively and the IT band was closed with #2 Vicryl running.  The wound was allowed to remain open and the tourniquet was let down at this point.  Total tourniquet time was about 15  minutes.  Attention was turned to the left knee where routine arthroscopy examination revealed that there was no significant chondromalacia in the patellofemoral joint.  We went into the medial compartment the medial meniscus was within normal limits.  The medial femoral condyle had chondromalacia of 2 x 2 cm grade 3 and some small areas of grade 4 change it was debrided with a suction shaver down to smooth and stable rim of cartilage down to bleeding bone where necessary.  Attention turned to the notch where the ACL was normal.  Attention turned laterally there was extensive lateral meniscal tearing anterior, mid body, and posterior.  From both the medial lateral portals instruments were introduced to debride the meniscal tear.  Suction shaver was used to debride the remaining meniscal rim down to a stable rim.  I took an 18-gauge spinal needle to put it through the area of cyst excision and joint capsule and we could see this coming to the area where working on the meniscus.  Attention turned to the lateral femoral condyle which unfortunately had some grade 3 and early grade 4 change.  This is an area about 2 x 2 cm tears debrided with a suction shaver back to a smooth stable rim.  Attention was turned throughout the knee at this point to make sure there is no loose impregnated pieces of meniscus or cartilage.  The knee was copiously thoroughly irrigated and suctioned dry.  The arthroscope portals were closed with a bandage after 20 cc  of 1/4% Marcaine instilled in the knee for postop anesthesia.  Some of this was applied around the lateral knee as well.  The lateral wound was closed in combination of 2-0 Vicryl and 3-0 Monocryl subcuticular.  Sterile compression dressing was applied patient taken recovery noted be in satisfactory condition.  Of note Signe Reining was present entire case and assisted back manipulation of the leg, retraction of tissues, close and minimize the wear time

## 2024-09-03 ENCOUNTER — Encounter (HOSPITAL_BASED_OUTPATIENT_CLINIC_OR_DEPARTMENT_OTHER): Payer: Self-pay | Admitting: Orthopedic Surgery

## 2025-02-26 ENCOUNTER — Ambulatory Visit: Payer: Self-pay | Admitting: Physician Assistant
